# Patient Record
Sex: Male | Born: 1940 | Race: White | Hispanic: No | Marital: Married | Smoking: Former smoker
Health system: Southern US, Community
[De-identification: ages and names within clinical notes are randomized; demographics above are authoritative.]

## PROBLEM LIST (undated history)

## (undated) DIAGNOSIS — N200 Calculus of kidney: Secondary | ICD-10-CM

## (undated) DIAGNOSIS — N529 Male erectile dysfunction, unspecified: Secondary | ICD-10-CM

## (undated) DIAGNOSIS — M25511 Pain in right shoulder: Secondary | ICD-10-CM

## (undated) DIAGNOSIS — C4490 Unspecified malignant neoplasm of skin, unspecified: Secondary | ICD-10-CM

## (undated) DIAGNOSIS — I10 Essential (primary) hypertension: Secondary | ICD-10-CM

## (undated) DIAGNOSIS — I2510 Atherosclerotic heart disease of native coronary artery without angina pectoris: Secondary | ICD-10-CM

## (undated) DIAGNOSIS — N281 Cyst of kidney, acquired: Secondary | ICD-10-CM

## (undated) DIAGNOSIS — K219 Gastro-esophageal reflux disease without esophagitis: Secondary | ICD-10-CM

## (undated) DIAGNOSIS — E785 Hyperlipidemia, unspecified: Secondary | ICD-10-CM

## (undated) DIAGNOSIS — E119 Type 2 diabetes mellitus without complications: Secondary | ICD-10-CM

## (undated) HISTORY — DX: Gastro-esophageal reflux disease without esophagitis: K21.9

## (undated) HISTORY — PX: LITHOTRIPSY: SUR834

## (undated) HISTORY — DX: Male erectile dysfunction, unspecified: N52.9

## (undated) HISTORY — DX: Cyst of kidney, acquired: N28.1

## (undated) HISTORY — DX: Calculus of kidney: N20.0

## (undated) HISTORY — PX: RECONSTRUCTION OF EYELID: SHX6576

## (undated) HISTORY — DX: Atherosclerotic heart disease of native coronary artery without angina pectoris: I25.10

## (undated) HISTORY — DX: Type 2 diabetes mellitus without complications: E11.9

## (undated) HISTORY — DX: Hyperlipidemia, unspecified: E78.5

## (undated) HISTORY — PX: CATARACT EXTRACTION W/ INTRAOCULAR LENS IMPLANT: SHX1309

## (undated) HISTORY — DX: Essential (primary) hypertension: I10

## (undated) HISTORY — PX: CORONARY ANGIOPLASTY WITH STENT PLACEMENT: SHX49

## (undated) HISTORY — DX: Pain in right shoulder: M25.511

## (undated) HISTORY — DX: Unspecified malignant neoplasm of skin, unspecified: C44.90

---

## 1999-12-06 HISTORY — PX: OTHER SURGICAL HISTORY: SHX169

## 2005-05-04 ENCOUNTER — Ambulatory Visit: Payer: Self-pay | Admitting: Internal Medicine

## 2005-06-14 ENCOUNTER — Ambulatory Visit: Payer: Self-pay | Admitting: Internal Medicine

## 2006-06-20 ENCOUNTER — Ambulatory Visit: Payer: Self-pay | Admitting: Internal Medicine

## 2007-07-09 DIAGNOSIS — I10 Essential (primary) hypertension: Secondary | ICD-10-CM | POA: Insufficient documentation

## 2007-07-10 ENCOUNTER — Ambulatory Visit: Payer: Self-pay | Admitting: Internal Medicine

## 2007-07-10 DIAGNOSIS — N400 Enlarged prostate without lower urinary tract symptoms: Secondary | ICD-10-CM

## 2007-07-10 DIAGNOSIS — M2550 Pain in unspecified joint: Secondary | ICD-10-CM | POA: Insufficient documentation

## 2007-07-10 LAB — CONVERTED CEMR LAB: LDL Goal: 100 mg/dL

## 2007-07-19 ENCOUNTER — Encounter (INDEPENDENT_AMBULATORY_CARE_PROVIDER_SITE_OTHER): Payer: Self-pay | Admitting: *Deleted

## 2007-07-19 LAB — CONVERTED CEMR LAB
BUN: 10 mg/dL (ref 6–23)
HDL: 38.6 mg/dL — ABNORMAL LOW (ref >39.0)
LDL Cholesterol: 112 mg/dL — ABNORMAL HIGH (ref 0–99)
PSA: 2.3 ng/mL (ref 0.10–4.00)
Total CHOL/HDL Ratio: 4.5
Triglycerides: 116 mg/dL (ref 0–149)
VLDL: 23 mg/dL (ref 0–40)

## 2007-08-18 ENCOUNTER — Encounter: Payer: Self-pay | Admitting: Internal Medicine

## 2007-08-22 ENCOUNTER — Ambulatory Visit (HOSPITAL_COMMUNITY): Admission: RE | Admit: 2007-08-22 | Discharge: 2007-08-22 | Payer: Self-pay | Admitting: Orthopedic Surgery

## 2008-09-16 ENCOUNTER — Ambulatory Visit: Payer: Self-pay | Admitting: Internal Medicine

## 2008-09-16 DIAGNOSIS — Z85828 Personal history of other malignant neoplasm of skin: Secondary | ICD-10-CM | POA: Insufficient documentation

## 2008-09-16 DIAGNOSIS — R7303 Prediabetes: Secondary | ICD-10-CM | POA: Insufficient documentation

## 2008-09-16 DIAGNOSIS — E785 Hyperlipidemia, unspecified: Secondary | ICD-10-CM | POA: Insufficient documentation

## 2008-09-17 ENCOUNTER — Ambulatory Visit: Payer: Self-pay | Admitting: Internal Medicine

## 2008-09-22 ENCOUNTER — Encounter (INDEPENDENT_AMBULATORY_CARE_PROVIDER_SITE_OTHER): Payer: Self-pay | Admitting: *Deleted

## 2008-09-22 LAB — CONVERTED CEMR LAB
ALT: 25 units/L (ref 0–53)
AST: 21 units/L (ref 0–37)
Cholesterol: 166 mg/dL (ref 0–200)
HDL: 39.4 mg/dL (ref >39.0)
Hgb A1c MFr Bld: 5.7 % (ref 4.6–6.0)
PSA: 2.13 ng/mL (ref 0.10–4.00)
TSH: 2.05 microintl units/mL (ref 0.35–5.50)
Total Bilirubin: 0.8 mg/dL (ref 0.3–1.2)
Total Protein: 6.8 g/dL (ref 6.0–8.3)

## 2008-12-30 ENCOUNTER — Encounter (INDEPENDENT_AMBULATORY_CARE_PROVIDER_SITE_OTHER): Payer: Self-pay | Admitting: *Deleted

## 2008-12-30 ENCOUNTER — Ambulatory Visit: Payer: Self-pay | Admitting: Internal Medicine

## 2008-12-30 LAB — CONVERTED CEMR LAB
OCCULT 2: NEGATIVE
OCCULT 3: NEGATIVE

## 2009-08-05 ENCOUNTER — Encounter: Payer: Self-pay | Admitting: Internal Medicine

## 2009-08-12 ENCOUNTER — Encounter: Payer: Self-pay | Admitting: Internal Medicine

## 2009-08-12 ENCOUNTER — Ambulatory Visit: Payer: Self-pay | Admitting: Urology

## 2009-09-03 ENCOUNTER — Ambulatory Visit: Payer: Self-pay | Admitting: Urology

## 2009-09-03 ENCOUNTER — Encounter: Payer: Self-pay | Admitting: Internal Medicine

## 2009-09-18 ENCOUNTER — Encounter: Payer: Self-pay | Admitting: Internal Medicine

## 2009-09-18 ENCOUNTER — Ambulatory Visit: Payer: Self-pay | Admitting: Urology

## 2009-09-29 ENCOUNTER — Ambulatory Visit: Payer: Self-pay | Admitting: Internal Medicine

## 2009-09-29 DIAGNOSIS — N200 Calculus of kidney: Secondary | ICD-10-CM | POA: Insufficient documentation

## 2009-09-29 DIAGNOSIS — N281 Cyst of kidney, acquired: Secondary | ICD-10-CM

## 2009-10-02 ENCOUNTER — Encounter (INDEPENDENT_AMBULATORY_CARE_PROVIDER_SITE_OTHER): Payer: Self-pay | Admitting: *Deleted

## 2009-10-22 ENCOUNTER — Ambulatory Visit: Payer: Self-pay | Admitting: Urology

## 2009-10-22 ENCOUNTER — Encounter: Payer: Self-pay | Admitting: Internal Medicine

## 2009-10-28 ENCOUNTER — Telehealth: Payer: Self-pay | Admitting: Internal Medicine

## 2009-11-02 ENCOUNTER — Encounter: Payer: Self-pay | Admitting: Cardiology

## 2009-11-02 ENCOUNTER — Ambulatory Visit: Payer: Self-pay | Admitting: Internal Medicine

## 2009-11-02 DIAGNOSIS — R079 Chest pain, unspecified: Secondary | ICD-10-CM

## 2009-11-02 DIAGNOSIS — R9431 Abnormal electrocardiogram [ECG] [EKG]: Secondary | ICD-10-CM

## 2009-11-02 DIAGNOSIS — M25519 Pain in unspecified shoulder: Secondary | ICD-10-CM

## 2009-11-04 ENCOUNTER — Ambulatory Visit: Payer: Self-pay | Admitting: Cardiology

## 2009-11-04 LAB — CONVERTED CEMR LAB
Basophils Absolute: 0 10*3/uL (ref 0.0–0.1)
Calcium: 9.7 mg/dL (ref 8.4–10.5)
GFR calc non Af Amer: 79.13 mL/min (ref >60)
Glucose, Bld: 76 mg/dL (ref 70–99)
Hemoglobin: 16.2 g/dL (ref 13.0–17.0)
INR: 1 (ref 0.8–1.0)
Lymphocytes Relative: 27.4 % (ref 12.0–46.0)
Monocytes Relative: 8.8 % (ref 3.0–12.0)
Neutro Abs: 3.8 10*3/uL (ref 1.4–7.7)
Neutrophils Relative %: 61.7 % (ref 43.0–77.0)
Prothrombin Time: 10.4 s (ref 9.1–11.7)
RDW: 12.2 % (ref 11.5–14.6)
Sodium: 139 meq/L (ref 135–145)

## 2009-11-09 ENCOUNTER — Ambulatory Visit: Payer: Self-pay | Admitting: Cardiology

## 2009-11-09 ENCOUNTER — Inpatient Hospital Stay (HOSPITAL_BASED_OUTPATIENT_CLINIC_OR_DEPARTMENT_OTHER): Admission: RE | Admit: 2009-11-09 | Discharge: 2009-11-09 | Payer: Self-pay | Admitting: Cardiology

## 2009-11-09 ENCOUNTER — Inpatient Hospital Stay (HOSPITAL_COMMUNITY): Admission: AD | Admit: 2009-11-09 | Discharge: 2009-11-10 | Payer: Self-pay | Admitting: Cardiology

## 2009-11-18 ENCOUNTER — Encounter: Payer: Self-pay | Admitting: Cardiology

## 2009-11-20 ENCOUNTER — Ambulatory Visit: Payer: Self-pay | Admitting: Cardiology

## 2009-11-20 DIAGNOSIS — I2510 Atherosclerotic heart disease of native coronary artery without angina pectoris: Secondary | ICD-10-CM | POA: Insufficient documentation

## 2009-12-30 ENCOUNTER — Telehealth: Payer: Self-pay | Admitting: Cardiology

## 2010-01-11 ENCOUNTER — Encounter: Payer: Self-pay | Admitting: Internal Medicine

## 2010-01-11 ENCOUNTER — Ambulatory Visit: Payer: Self-pay | Admitting: Urology

## 2010-01-20 ENCOUNTER — Ambulatory Visit: Payer: Self-pay | Admitting: Cardiology

## 2010-01-26 LAB — CONVERTED CEMR LAB
ALT: 31 units/L (ref 0–53)
AST: 25 units/L (ref 0–37)
Albumin: 3.8 g/dL (ref 3.5–5.2)
Alkaline Phosphatase: 56 units/L (ref 39–117)
Cholesterol: 106 mg/dL (ref 0–200)
LDL Cholesterol: 48 mg/dL (ref 0–99)
Total Protein: 6.4 g/dL (ref 6.0–8.3)
Triglycerides: 73 mg/dL (ref 0.0–149.0)

## 2010-02-19 ENCOUNTER — Ambulatory Visit: Payer: Self-pay | Admitting: Cardiology

## 2010-04-04 HISTORY — PX: OTHER SURGICAL HISTORY: SHX169

## 2010-04-12 ENCOUNTER — Ambulatory Visit: Payer: Self-pay | Admitting: General Practice

## 2010-04-12 ENCOUNTER — Telehealth: Payer: Self-pay | Admitting: Cardiology

## 2010-04-20 ENCOUNTER — Ambulatory Visit: Payer: Self-pay | Admitting: Urology

## 2010-04-26 ENCOUNTER — Telehealth: Payer: Self-pay | Admitting: Cardiology

## 2010-04-28 ENCOUNTER — Encounter: Payer: Self-pay | Admitting: Internal Medicine

## 2010-08-18 ENCOUNTER — Ambulatory Visit: Payer: Self-pay | Admitting: Cardiology

## 2010-08-24 LAB — CONVERTED CEMR LAB
Albumin: 3.8 g/dL (ref 3.5–5.2)
Cholesterol: 117 mg/dL (ref 0–200)
HDL: 38.3 mg/dL — ABNORMAL LOW (ref >39.00)
Total Protein: 6.2 g/dL (ref 6.0–8.3)
Triglycerides: 74 mg/dL (ref 0.0–149.0)
VLDL: 14.8 mg/dL (ref 0.0–40.0)

## 2010-08-25 ENCOUNTER — Ambulatory Visit: Payer: Self-pay | Admitting: Cardiology

## 2010-09-06 ENCOUNTER — Telehealth: Payer: Self-pay | Admitting: Cardiology

## 2010-09-08 ENCOUNTER — Telehealth (INDEPENDENT_AMBULATORY_CARE_PROVIDER_SITE_OTHER): Payer: Self-pay | Admitting: Radiology

## 2010-09-09 ENCOUNTER — Ambulatory Visit: Payer: Self-pay | Admitting: Internal Medicine

## 2010-09-09 ENCOUNTER — Ambulatory Visit: Payer: Self-pay

## 2010-09-09 ENCOUNTER — Ambulatory Visit: Payer: Self-pay | Admitting: Cardiology

## 2010-09-09 ENCOUNTER — Encounter: Payer: Self-pay | Admitting: Cardiology

## 2010-09-09 ENCOUNTER — Encounter (HOSPITAL_COMMUNITY)
Admission: RE | Admit: 2010-09-09 | Discharge: 2010-09-20 | Payer: Self-pay | Source: Home / Self Care | Admitting: Cardiology

## 2010-09-09 ENCOUNTER — Inpatient Hospital Stay (HOSPITAL_COMMUNITY): Admission: EM | Admit: 2010-09-09 | Discharge: 2010-09-10 | Payer: Self-pay | Admitting: Emergency Medicine

## 2010-09-09 ENCOUNTER — Encounter: Payer: Self-pay | Admitting: Internal Medicine

## 2010-09-13 ENCOUNTER — Telehealth: Payer: Self-pay | Admitting: Cardiology

## 2010-09-14 ENCOUNTER — Encounter: Payer: Self-pay | Admitting: Cardiology

## 2010-09-28 ENCOUNTER — Ambulatory Visit: Payer: Self-pay | Admitting: Cardiology

## 2010-11-03 ENCOUNTER — Ambulatory Visit: Payer: Self-pay | Admitting: Cardiology

## 2010-11-08 LAB — CONVERTED CEMR LAB
Alkaline Phosphatase: 73 units/L (ref 39–117)
Bilirubin, Direct: 0.2 mg/dL (ref 0.0–0.3)
LDL Cholesterol: 54 mg/dL (ref 0–99)
Total CHOL/HDL Ratio: 3
VLDL: 15.2 mg/dL (ref 0.0–40.0)

## 2010-11-09 ENCOUNTER — Ambulatory Visit: Payer: Self-pay | Admitting: Internal Medicine

## 2010-11-09 DIAGNOSIS — D485 Neoplasm of uncertain behavior of skin: Secondary | ICD-10-CM

## 2010-11-09 DIAGNOSIS — R1310 Dysphagia, unspecified: Secondary | ICD-10-CM | POA: Insufficient documentation

## 2010-11-11 LAB — CONVERTED CEMR LAB
BUN: 18 mg/dL (ref 6–23)
Basophils Relative: 0.3 % (ref 0.0–3.0)
CO2: 28 meq/L (ref 19–32)
Chloride: 104 meq/L (ref 96–112)
Creatinine, Ser: 1.1 mg/dL (ref 0.4–1.5)
Eosinophils Absolute: 0.1 10*3/uL (ref 0.0–0.7)
Eosinophils Relative: 1.1 % (ref 0.0–5.0)
HCT: 44.5 % (ref 39.0–52.0)
Hgb A1c MFr Bld: 5.8 % (ref 4.6–6.5)
Lymphs Abs: 1.7 10*3/uL (ref 0.7–4.0)
MCHC: 34.8 g/dL (ref 30.0–36.0)
MCV: 94.9 fL (ref 78.0–100.0)
Monocytes Absolute: 0.5 10*3/uL (ref 0.1–1.0)
Neutrophils Relative %: 60.2 % (ref 43.0–77.0)
Potassium: 4.6 meq/L (ref 3.5–5.1)
RBC: 4.7 M/uL (ref 4.22–5.81)
WBC: 5.8 10*3/uL (ref 4.5–10.5)

## 2010-11-24 ENCOUNTER — Ambulatory Visit: Payer: Self-pay | Admitting: Internal Medicine

## 2010-11-24 ENCOUNTER — Encounter (INDEPENDENT_AMBULATORY_CARE_PROVIDER_SITE_OTHER): Payer: Self-pay | Admitting: *Deleted

## 2010-11-24 LAB — CONVERTED CEMR LAB: OCCULT 3: NEGATIVE

## 2010-12-20 ENCOUNTER — Encounter: Payer: Self-pay | Admitting: Cardiology

## 2011-01-02 LAB — CONVERTED CEMR LAB
AST: 21 units/L (ref 0–37)
Albumin: 4.4 g/dL (ref 3.5–5.2)
Basophils Absolute: 0 10*3/uL (ref 0.0–0.1)
CO2: 29 meq/L (ref 19–32)
Eosinophils Absolute: 0.1 10*3/uL (ref 0.0–0.7)
Glucose, Bld: 96 mg/dL (ref 70–99)
HCT: 46.8 % (ref 39.0–52.0)
HDL: 46.8 mg/dL (ref >39.00)
Hemoglobin: 16.1 g/dL (ref 13.0–17.0)
Hgb A1c MFr Bld: 5.7 % (ref 4.6–6.5)
LDL Goal: 130 mg/dL
Lymphs Abs: 1.5 10*3/uL (ref 0.7–4.0)
MCHC: 34.3 g/dL (ref 30.0–36.0)
Neutro Abs: 3.6 10*3/uL (ref 1.4–7.7)
Potassium: 4.3 meq/L (ref 3.5–5.1)
RDW: 12.3 % (ref 11.5–14.6)
Sodium: 138 meq/L (ref 135–145)
TSH: 2.43 microintl units/mL (ref 0.35–5.50)

## 2011-01-04 NOTE — Assessment & Plan Note (Signed)
Summary: Cardiology Nuclear Testing  Nuclear Med Background Indications for Stress Test: Evaluation for Ischemia, Stent Patency   History: Heart Catheterization, Stents  History Comments: 12/10 Stent-RCA  Symptoms: Chest Tightness, Chest Tightness with Exertion, Fatigue, Fatigue with Exertion  Symptoms Comments: Chest tightness with jaw pain. Last episode of DG:UYQIHK, 09/06/10.   Nuclear Pre-Procedure Cardiac Risk Factors: Family History - CAD, History of Smoking, Hypertension, Lipids Caffeine/Decaff Intake: none NPO After: 7:00 PM Lungs: Clear IV 0.9% NS with Angio Cath: 22g     IV Site: R Hand IV Started by: Cathlyn Parsons, RN Chest Size (in) 46     Height (in): 70 Weight (lb): 208 BMI: 29.95 Tech Comments: Metoprolol held x 26 hours.  Nuclear Med Study 1 or 2 day study:  1 day     Stress Test Type:  Treadmill/Lexiscan Reading MD:  Arvilla Meres, MD     Referring MD:  Marca Ancona, MD Resting Radionuclide:  Technetium 80m Tetrofosmin     Resting Radionuclide Dose:  11.0 mCi  Stress Radionuclide:  Technetium 60m Tetrofosmin     Stress Radionuclide Dose:  33.0 mCi   Stress Protocol Exercise Time (min):  8:00 min     Max HR:  115 bpm     Predicted Max HR:  152 bpm  Max Systolic BP: 184 mm Hg     Percent Max HR:  75.66 %     METS: 7.2 Rate Pressure Product:  74259    Stress Test Technologist:  [employer], CMA-N     Nuclear Technologist:  Doyne Keel, CNMT  Rest Procedure  Myocardial perfusion imaging was performed at rest 45 minutes following the intravenous administration of Technetium 45m Tetrofosmin.  Stress Procedure  Patient attempted to walk the treadmill utilizing the Bruce protocol, but was unable to get his heart rate up after walking for 6:30.  He was then given IV Lexiscan 0.4 mg over 15-seconds and then Technetium 68m Tetrofosmin was injected at 30-seconds while the patient continued walking at a low level.  He did c/o mouth, teeth and sinus pressure,  4/10, in the beginning and then chest pressure, 4/10.  He became diaphoretic and nauseated.  He did have inferior ST elevation and ST depression in lead I, V2 and V3 just before getting off the treadmill.  Dr. Johney Frame responded and the patient was transferred to [facility] for cardiac cath today.  No stress imaging done.  QPS Raw Data Images:  Normal; no motion artifact; normal heart/lung ratio. Stress Images:  Not perfrormed Rest Images:  Normal homogeneous uptake in all areas of the myocardium.   Findings High risk nuclear study      Overall Impression  Exercise Capacity: Fair exercise capacity. ECG Impression: Inferior ST elevation Overall Impression: High risk stress nuclear study. Overall Impression Comments: Walked for 6:30 on Bruce protocol but unable to get HR to target. Lexiscan infused and patient abruptly developed CP and inferior ST elevation. EMS activated and taken to hospital for cardiac cath.

## 2011-01-04 NOTE — Progress Notes (Signed)
Summary: stop effient  Phone Note From Other Clinic   Caller: nurse Tammy Summary of Call: pt needs to stop effient 10mg  prior to procedure on tuesday or wednesday next week. ofc [id number] fax [phone] Initial call taken by: Edman Circle,  Apr 12, 2010 3:19 PM  Follow-up for Phone Call        I spoke with nurse Tammy.  Mr. Gloss is having a kidney stone removed next week and needs to be off effient.  I told Tammy I would forward this to Dr. Shirlee Latch as he was out of the office today and would be in tomorrow.  Lisabeth Devoid RN  Additional Follow-up for Phone Call Additional follow up Details #1::        PHYSICIAN OFFICE CALLING REGARDING THIS MESSAGE-THEY NEED AN ANSWER TODAY BECAUSE THE SURGERY IS IN 5 DAYS. Additional Follow-up by: AMANDA DOBY     Appended Document: stop effient If this is an urgent procedure that should not be put off, he may stop Effient 5 days before procedure and restart post-operatively.  Continue ASA.   Appended Document: stop effient talked with Tammy at Dr Assunta Gambles

## 2011-01-04 NOTE — Letter (Signed)
Summary: [facility]   Imported By: Lanelle Bal 01/18/2010 13:47:21  _____________________________________________________________________  External Attachment:    Type:   Image     Comment:   External Document

## 2011-01-04 NOTE — Progress Notes (Signed)
Summary: c/o chest tightness  Phone Note Call from Patient Call back at Home Phone [phone]   Caller: Spouse- betty Reason for Call: Talk to Nurse Details for Reason: c/o chest tightness, jaw/ head hurt like a sinu infection.  Initial call taken by: Lorne Skeens,  September 06, 2010 9:16 AM  Follow-up for Phone Call        talked with pt--pt here about 2 weeks ago and was doing fine--[place] and carrying things up the steps--he denies any chest pain/tightness/heaviness/fullness in his chest at the time he was unloading--he felt exhausted after unloading and felt he needed to sit down, he  felt better later in the day--he was able to continue his usual activities--last night while at rest he had some upper teeth and face-like sinus pain-this was not associated  with any other symptoms,including indigestion--he did state he had similar symptoms prior to stent placement --prior to stent placement he had similar teeth/face pain but also had indigestion--I reviewed with Dr Radene Gunning symptoms are similar to symptoms prior to stent placement and pt does not feel he has any nasal congestion or upper respiratory congestion Dr Shirlee Latch recommended a stress myoview--I discussed with pt and he agreed with this plan--he will use NTG as needed and will call 911 if symptoms are not relieved with NTG

## 2011-01-04 NOTE — Progress Notes (Signed)
Summary: pt has question about medication  Phone Note Call from Patient Call back at [phone]   Caller: Spouse/Betty Reason for Call: Talk to Nurse, Talk to Doctor Summary of Call: pt had surgery and was off his effient and they wannna talk about to start back  Initial call taken by: Omer Jack,  Apr 26, 2010 10:45 AM  Follow-up for Phone Call        Spoke with wife. Pt had procedure on 5/17. Procedure was more complicated than expected and he had to have catheter placed and went home with catheter in place. Restarted effient on 5/20. Wife wondering if they were to restart Effient. I told her per Dr. Alford Highland note effient was to be restarted post op.  Wife states pt has had some bleeding noted around catheter insertion site. Bleeding has been slight and ongoing since surgery. I asked her to contact his urologist regarding this bleeding. Follow-up by: Dossie Arbour, RN, BSN,  Apr 26, 2010 12:01 PM

## 2011-01-04 NOTE — Assessment & Plan Note (Signed)
Summary: per check out/sf   Referring Provider:  Elmore Guise, MD Primary Provider:  Marga Melnick MD  CC:  Pt states he feels okay and no cardiac symptoms or complaints.  He would like to know exactly where his stents are placed.  Pts BP is higher today than normal and his hands are very clammy upon taking pulse/BP rechecked after 10 minutes/systolic down to 161.  History of Present Illness: 70 yo with history of HTN, hyperlipidemia and CAD presents for followup after PCI.  Patient developed exertional chest pain and underwent LHC in 12/10.  This showed tight proximal RCA and moderate mid RCA lesions.  He underwent PCI with 2 bare metal stents in the RCA.  Since discharge he has done well. No further anginal-type chest pain.  He has been walking over a mile on his treadmill daily.  He was working out in the yard yesterday and woke up with pain in both shoulders this morning, but he thinks it was due to sore muscles.  He had a kidney stone attack again in 1/11.    Labs (11/10): creatinine 1.0, K 4.6, TSH normal, LDL 109, HDL 47 Labs (2/11): LDL 48, HDL 43, LFTs normal  Current Medications (verified): 1)  Norvasc 10 Mg  Tabs (Amlodipine Besylate) .... 1/2 Tab By Mouth Once Daily 2)  Quinapril Hcl 40 Mg  Tabs (Quinapril Hcl) .... One Tablet Daily 3)  Metoprolol Tartrate 25 Mg Tabs (Metoprolol Tartrate) .Marland Kitchen.. 1 Two Times A Day 4)  Bufferin 325 Mg Tabs (Aspirin Buf(Cacarb-Mgcarb-Mgo)) .... Take One Tablet Once Daily 5)  Nitroglycerin 0.4 Mg/hr Pt24 (Nitroglycerin) .Marland Kitchen.. 1 Sl As Needed Chest Pain 6)  Simvastatin 40 Mg Tabs (Simvastatin) .... Take One Tablet By Mouth Daily At Bedtime 7)  Effient 10 Mg Tabs (Prasugrel Hcl) .... Take One Tablet By Mouth Daily 8)  Viagra 100 Mg Tabs (Sildenafil Citrate) .... Take One Tablet As Needed  Allergies (verified): No Known Drug Allergies  Past History:  Past Medical History: Reviewed history from 11/20/2009 and no changes required. 1. Hypertension 2.  Nephrolithiasis. Dr. Wanda Plump. 3. Hyperlipidemia 4. Skin cancer, hx of, cell type unknown , Dr Margo Aye 5. Right shoulder pain, rotator cuff 6. CAD: Exertional chest pain, cath 12/10 showed EF 50-55% with mild inferior hypokinesis, 99% proximal and 60% distal RCA stenosis, 80% proximal stenosis of a moderate-sized D1, 40% proximal LAD.  Patient had BMS x 2 placed in the RCA.    Family History: Reviewed history from 11/04/2009 and no changes required. Father:? DM, MI at 44, smoker Mother: breast CA, Gyn CA Siblings: sister breast CA  Social History: Reviewed history from 11/20/2009 and no changes required. Retired, Theatre manager Married Alcohol use-yes:minimally (6 beers /year) Regular exercise-no Former Smoker; quit 1982  Review of Systems       All systems reviewed and negative except as per HPI.   Vital Signs:  Patient profile:   70 year old male Height:      70 inches Weight:      214 pounds BMI:     30.82 Pulse rate:   72 / minute Pulse rhythm:   regular BP sitting:   130 / 80  (left arm) Cuff size:   regular  Vitals Entered By: Judithe Modest CMA (February 19, 2010 11:39 AM)  Serial Vital Signs/Assessments:  Time      Position  BP       Pulse  Resp  Temp     By 11:56 AM  130/80                         Judithe Modest CMA   Physical Exam  General:  Well developed, well nourished, in no acute distress. Neck:  Neck supple, no JVD. No masses, thyromegaly or abnormal cervical nodes. Lungs:  Clear bilaterally to auscultation and percussion. Heart:  Non-displaced PMI, chest non-tender; regular rate and rhythm, S1, S2 without murmurs, rubs or gallops. Carotid upstroke normal, no bruit. Pedals normal pulses. No edema, no varicosities. Abdomen:  Bowel sounds positive; abdomen soft and non-tender without masses, organomegaly, or hernias noted. No hepatosplenomegaly. Extremities:  No clubbing or cyanosis. Neurologic:  Alert and oriented x 3. Psych:  Normal  affect.   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  He is no longer having exertional chest pressure.  He has residual 80% proximal stenosis in a moderate D1 which we elected to manage medically, will observe closely for recurrent symptoms.  As he received bare metal stents, if he continues to be severely symptomatic with kidney stones and needs to undergo lithotripsy, he could stop prasugrel for the procedure (has been > 1 month post stents) and restart after the procedure.  However, ideally he will continue prasugrel without interruption for at least a year.  He will also continue ASA, statin, metoprolol, and ACEI.    Problem # 2:  HYPERLIPIDEMIA (ICD-272.4) Lipids at goal (LDL < 70) on current statin dose.    Problem # 3:  HYPERTENSION (ICD-401.9) BP is under good control.   Patient Instructions: 1)  Your physician recommends that you return for a FASTING lipid profile/liver profile September 14,2011   2)  Your physician wants you to follow-up in:  6 months with Dr Shirlee Latch.  You will receive a reminder letter in the mail two months in advance. If you don't receive a letter, please call our office to schedule the follow-up appointment.

## 2011-01-04 NOTE — Letter (Signed)
Summary: [facility]  [facility]   Imported By: Lanelle Bal 05/13/2010 12:42:28  _____________________________________________________________________  External Attachment:    Type:   Image     Comment:   External Document

## 2011-01-04 NOTE — Assessment & Plan Note (Signed)
Summary: [facility]   Visit Type:  Follow-up Referring Provider:  Elmore Guise, MD Primary Provider:  Marga Melnick MD   History of Present Illness: Peter Blair is a pleasant 70 yo WM with a h/o known CAD s/p PCI RCA 12/10 who now presents for stress testing.  He reports crescendoing angina since 10/3.  He describes diaphoresis with SOB and pain into his chest /neck.  Episodes increased in frequency and duration and he therefore presented today for stress testing.  While walking on treadmill, he developed diaphorsis and SOB.  As he was not at target HR, lexiscan was injected.  He then developed inferior ST elevation with ST depression in V2 and V3.  He has reproduction of sympotoms including diaphorsis, nausaea, and chest pain.  Upon termination of exercise, his symptoms gradually resolved and his ST segments normalized.  Allergies: No Known Drug Allergies  Past History:  Past Medical History: Reviewed history from 11/20/2009 and no changes required. 1. Hypertension 2. Nephrolithiasis. Dr. Wanda Plump. 3. Hyperlipidemia 4. Skin cancer, hx of, cell type unknown , Dr Margo Aye 5. Right shoulder pain, rotator cuff 6. CAD: Exertional chest pain, cath 12/10 showed EF 50-55% with mild inferior hypokinesis, 99% proximal and 60% distal RCA stenosis, 80% proximal stenosis of a moderate-sized D1, 40% proximal LAD.  Patient had BMS x 2 placed in the RCA.    Past Surgical History: Reviewed history from 09/29/2009 and no changes required. lithotripsy X1; Flex sigmoidoscopy ( no colonoscopy ; "I hate to go through it"; [facility] reviewed)  Family History: Reviewed history from 11/04/2009 and no changes required. Father:? DM, MI at 83, smoker Mother: breast CA, Gyn CA Siblings: sister breast CA  Social History: Reviewed history from 11/20/2009 and no changes required. Retired, Theatre manager Married Alcohol use-yes:minimally (6 beers /year) Regular exercise-no Former Smoker; quit  1982  Review of Systems       All systems are reviewed and negative except as listed in the HPI.   Physical Exam  General:  ill appearing and diaphoretic Head:  normocephalic and atraumatic Eyes:  PERRLA/EOM intact; conjunctiva and lids normal. Mouth:  Teeth, gums and palate normal. Oral mucosa normal. Neck:  Neck supple, no JVD. No masses, thyromegaly or abnormal cervical nodes. Lungs:  Clear bilaterally to auscultation and percussion. Heart:  Non-displaced PMI, chest non-tender; regular rate and rhythm, S1, S2 without murmurs, rubs or gallops. Carotid upstroke normal, no bruit. Normal abdominal aortic size, no bruits. Femorals normal pulses, no bruits. Pedals normal pulses. No edema, no varicosities. Abdomen:  Bowel sounds positive; abdomen soft and non-tender without masses, organomegaly, or hernias noted. No hepatosplenomegaly. Msk:  Back normal, normal gait. Muscle strength and tone normal. Pulses:  pulses normal in all 4 extremities Extremities:  No clubbing or cyanosis. Neurologic:  Alert and oriented x 3.   Impression & Recommendations:  Problem # 1:  ENCOUNTER FOR LONG-TERM USE OF OTHER MEDICATIONS (ICD-V58.69) The patient presents with symptoms worrisome for unstable angina.  During stress testing today, he had transient inferior ST segment elevation with reproduction of anginal symptoms.  He is now chest pain free and ST segment elevation is resolving. He has received ASA and slNTG. He will now be admitted urgently for cath at [facility].

## 2011-01-04 NOTE — Progress Notes (Signed)
Summary: NEED NEW PRESCRIPTION   Phone Note Call from Patient Call back at Home Phone [phone]   Caller: Patient Summary of Call: PT NEED A NEW PRESCRIPTION  90 DAY FAX TO [facility] [phone]( EFFIENT 10MG ) MEMBER ID [id number]  Initial call taken by: Judie Grieve,  December 30, 2009 2:41 PM  Follow-up for Phone Call        new Rx sent for effient to [facility] for 90 day supply Follow-up by: Judithe Modest CMA,  December 30, 2009 4:22 PM    New/Updated Medications: EFFIENT 10 MG TABS (PRASUGREL HCL) Take one tablet by mouth daily Prescriptions: EFFIENT 10 MG TABS (PRASUGREL HCL) Take one tablet by mouth daily  #90 x 3   Entered by:   Judithe Modest CMA   Authorized by:   Marca Ancona, MD   Signed by:   Judithe Modest CMA on 12/30/2009   Method used:   Electronically to        [facility]* (mail-order)             ,          Ph: [phone]       Fax: [phone]   RxID:   [id number]

## 2011-01-04 NOTE — Progress Notes (Signed)
Summary: B/P readings  Phone Note Outgoing Call   Call placed by: Katina Dung, RN, BSN,  September 13, 2010 5:35 PM Call placed to: Patient Summary of Call: B/P readings  Follow-up for Phone Call        pt had stent placement 09/09/10--I called pt to get B/P--these are readings prior to stent placement 09/09/10--- 136.9 systolic average 15 readings--diastolic  average  from 15 readings--74.1 -pt has p hosp appt with Dr Shirlee Latch 09/28/10--he will continue to check his B/P and bring the readings to the appt 09/28/10--I will forward to Dr Shirlee Latch for review

## 2011-01-04 NOTE — Cardiovascular Report (Signed)
Summary: Pre-Cath Orders  Pre-Cath Orders   Imported By: Marylou Mccoy 09/23/2010 08:31:20  _____________________________________________________________________  External Attachment:    Type:   Image     Comment:   External Document

## 2011-01-04 NOTE — Assessment & Plan Note (Signed)
Summary: med refill/cbs   Vital Signs:  Patient profile:   70 year old male Height:      70.75 inches Weight:      210.8 pounds BMI:     29.72 Temp:     98.4 degrees F oral Pulse rate:   76 / minute Resp:     16 per minute BP sitting:   122 / 80  (left arm) Cuff size:   regular  Vitals Entered By: Shonna Chock CMA (November 09, 2010 10:58 AM) CC: 1.) Yearly   2.)Sharpe pain in abdomen, left side x 1 month  Comments Patient states he is up to date on pneumonia and flu vaccine    Primary Care Provider:  Marga Melnick MD  CC:  1.) Yearly   2.)Sharpe pain in abdomen and left side x 1 month .  History of Present Illness: Here for Medicare AWV: 1.Risk factors based on Past M, S, F history:CAD; HTN;Dyslipidemia ( chart updated) 2.Physical Activities:walking > 60 min 1-2X/ week w/o symptoms 3.Depression/mood: no issues 4.Hearing: whisper heard @ 6 ft 5.ADL's: no limitations 6.Fall Risk: none ( 1 fall when he tripped) 7.Home Safety: safety proofed 8.Height, weight, &visual acuity:wall chart read @ 6 ft with lenses 9.Counseling: POA &Living Will in place 10.Labs ordered based on risk factors: see Orders 11.Referral Coordination: none requested 12. Care Plan: see Instructions 13. Cognitive Assessment: Oriented X 3 ;memory & recall intact  ; "WORLD" spelled backwards; mood & affect normal. Hypertension Follow-Up:  The patient reports fatigue, but denies lightheadedness, urinary frequency, headaches, and edema.  Associated symptoms include exercise intolerance due to  musculoskeletal issues.  The patient denies the following associated symptoms: chest pain, chest pressure, dyspnea, palpitations, and syncope.  Compliance with medications (by patient report) has been near 100%.  Adjunctive measures currently NOT used by the patient include salt restriction.  BP averages 128.6/70.65. Hyperlipidemia Follow-Up: The patient reports diffuse , intermittent  muscle aches, but denies GI upset,  abdominal pain, flushing, itching, constipation, and diarrhea.  Adjunctive measures currently used by the patient include ASA.  LDL 54 & HDL 40 @ Dr Alford Highland office.  Preventive Screening-Counseling & Management  Alcohol-Tobacco     Alcohol drinks/day: 0     Smoking Status: quit > 6 months     Year Started: 1962     Year Quit: 1972  Caffeine-Diet-Exercise     Caffeine use/day: rarely     Diet Comments: no diet  Hep-HIV-STD-Contraception     Dental Visit-last 6 months yes     Sun Exposure-Excessive: no  Safety-Violence-Falls     Seat Belt Use: yes     Firearms in the Home: firearms in the home     Firearm Counseling: not indicated; uses recommended firearm safety measures     Smoke Detectors: yes      Blood Transfusions:  no.        Travel History:  1963 [place].    Allergies (verified): No Known Drug Allergies  Past History:  Past Medical History: 1. Hypertension 2. Nephrolithiasis. Dr. Achilles Dunk, [place] 3. Hyperlipidemia 4. Skin cancer,PMH  of, cell type unknown , Dr Margo Aye 5. Right shoulder pain, rotator cuff 6. CAD: Exertional chest pain, cath 12/10 showed EF 50-55% with mild inferior hypokinesis, 99% proximal and 60% distal RCA stenosis, 80% proximal stenosis of a moderate-sized D1, 40% proximal LAD.  Patient had BMS x 2 placed in the RCA.  Unstable angina 10/11 with LHC showing 99% instent restenosis in proximal RCA.  Patient had PROMUS DES to the proximal RCA.  EF 60% on LV-gram.   Past Surgical History: Lithotripsy X1; Cystoscopic removal 04/2010; Flexibe  sigmoidoscopy ( no colonoscopy ; "I hate to go through it"; [facility] reviewed again; Effient precludes procedure @ present)  Family History: Father:? DM, MI at 54, smoker Mother: breast  & Gyn cancer Siblings: sister: breast cancer  Social History: Retired, Theatre manager Married Alcohol use-yes:minimally (6 beers /year) Regular exercise-no Former Smoker; quit 1972 Smoking Status:  quit > 6 months Caffeine  use/day:  rarely Dental Care w/in 6 mos.:  yes Sun Exposure-Excessive:  no Seat Belt Use:  yes Blood Transfusions:  no  Review of Systems  The patient denies anorexia, fever, weight loss, weight gain, vision loss, decreased hearing, hoarseness, prolonged cough, hemoptysis, melena, hematochezia, depression, unusual weight change, enlarged lymph nodes, and angioedema.         Some food dysphagia 1-2 X/week in context of dyspepsia. Recurrent scalp lesion; PMH of skin cancer. Easy bruising with Effient  Physical Exam  General:  well-nourished,in no acute distress; alert,appropriate and cooperative throughout examination Head:  Normocephalic and atraumatic without obvious abnormalities. pattern  alopecia  Eyes:  No corneal or conjunctival inflammation noted. EOMI. Perrla. Funduscopic exam benign, without hemorrhages, exudates or papilledema. Vision grossly normal. Ears:  External ear exam shows no significant lesions or deformities.  Otoscopic examination reveals clear canals, tympanic membranes are intact bilaterally without bulging, retraction, inflammation or discharge. Hearing is grossly normal bilaterally. External hirsutism Nose:  External nasal examination shows no deformity or inflammation. Nasal mucosa are pink and moist without lesions or exudates. Slight septal deviation Mouth:  Oral mucosa and oropharynx without lesions or exudates.  Teeth in good repair. Neck:  No deformities, masses, or tenderness noted. Lungs:  Normal respiratory effort, chest expands symmetrically. Lungs are clear to auscultation, no crackles or wheezes. Heart:  Normal rate and regular rhythm. S1 and S2 normal without gallop, murmur, click, rub . Soft S4 Abdomen:  Bowel sounds positive,abdomen soft  but minimal LUQ  tenderness Illdefined R abd mass ( "renal size is 15 cm  size "); ventral  hernias noted. Genitalia:  Dr Achilles Dunk Msk:  No deformity or scoliosis noted of thoracic or lumbar spine.   Pulses:  R and L  carotid,radial,dorsalis pedis and posterior tibial pulses are full and equal bilaterally Extremities:  No clubbing, cyanosis, edema, or deformity noted with normal full range of motion of all joints.   Neurologic:  alert & oriented X3 and DTRs symmetrical and normal.   Skin:  Keratotic scalp lesion Cervical Nodes:  No lymphadenopathy noted Axillary Nodes:  No palpable lymphadenopathy Psych:  memory intact for recent and remote, normally interactive, and good eye contact.     Impression & Recommendations:  Problem # 1:  PREVENTIVE HEALTH CARE (ICD-V70.0)  Orders: [facility] -Subsequent Annual Wellness Visit [phone])  Problem # 2:  DYSPHAGIA UNSPECIFIED (ICD-787.20) risks discussed Orders: Venipuncture (60454) TLB-CBC Platelet - w/Differential (85025-CBCD)  Problem # 3:  NEOPLASM OF UNCERTAIN BEHAVIOR OF SKIN (ICD-238.2)  Problem # 4:  HYPERTENSION (ICD-401.9) controlled His updated medication list for this problem includes:    Norvasc 10 Mg Tabs (Amlodipine besylate) .Marland Kitchen... 1 by mouth daily    Quinapril Hcl 40 Mg Tabs (Quinapril hcl) ..... One tablet daily    Metoprolol Tartrate 25 Mg Tabs (Metoprolol tartrate) .Marland Kitchen... 1 two times a day  Orders: Venipuncture (09811) TLB-BMP (Basic Metabolic Panel-BMET) (80048-METABOL)  Problem # 5:  HYPERGLYCEMIA, FASTING (ICD-790.29)  PMH  of  Orders: Venipuncture (16109) TLB-A1C / Hgb A1C (Glycohemoglobin) (83036-A1C)  Problem # 6:  CAD, NATIVE VESSEL (ICD-414.01)  His updated medication list for this problem includes:    Norvasc 10 Mg Tabs (Amlodipine besylate) .Marland Kitchen... 1 by mouth daily    Quinapril Hcl 40 Mg Tabs (Quinapril hcl) ..... One tablet daily    Metoprolol Tartrate 25 Mg Tabs (Metoprolol tartrate) .Marland Kitchen... 1 two times a day    Bufferin 325 Mg Tabs (Aspirin buf(cacarb-mgcarb-mgo)) .Marland Kitchen... Take one tablet once daily    Nitroglycerin 0.4 Mg/hr Pt24 (Nitroglycerin) .Marland Kitchen... 1 sl as needed chest pain    Effient 10 Mg Tabs (Prasugrel hcl) .Marland Kitchen... 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 Do not take with ntg; 1/2 -1 once daily as needed. He knows can not be taken with Nitroglycerin  Complete Medication List: 1)  Norvasc 10 Mg Tabs (Amlodipine besylate) .Marland Kitchen.. 1 by mouth daily 2)  Quinapril Hcl 40 Mg Tabs (Quinapril hcl) .... One tablet daily 3)  Metoprolol Tartrate 25 Mg Tabs (Metoprolol tartrate) .Marland Kitchen.. 1 two times a day 4)  Bufferin 325 Mg Tabs (Aspirin buf(cacarb-mgcarb-mgo)) .... Take one tablet once daily 5)  Nitroglycerin 0.4 Mg/hr Pt24 (Nitroglycerin) .Marland Kitchen.. 1 sl as needed chest pain 6)  Lipitor 40 Mg Tabs (Atorvastatin calcium) .Marland Kitchen.. 1 by mouth daily 7)  Effient 10 Mg Tabs (Prasugrel hcl) .... Take one tablet by mouth daily 8)  Viagra 100 Mg Tabs (Sildenafil citrate) .... Do not take with ntg; 1/2 -1 once daily as needed 9)  Ranitidine Hcl 150 Mg Tabs (Ranitidine hcl) .Marland Kitchen.. 1 two times a day pre meals  Other Orders: TLB-TSH (Thyroid Stimulating Hormone) (60454-UJW)  Patient Instructions: 1)  Make appt to see Dr Suan Halter, Derm. 2)  Avoid foods high in acid (tomatoes, citrus juices, spicy foods). Avoid eating within two hours of lying down or before exercising. Do not over eat; try smaller more frequent meals. Elevate head of bed twelve inches when sleeping. Complete stool cards. GI referral absolutely necessary if DYSPHAGIA progresses despite meds as discussed. Prescriptions: RANITIDINE HCL 150 MG TABS (RANITIDINE HCL) 1 two times a day pre meals  #180 x 0   Entered and Authorized by:   Marga Melnick MD   Signed by:   Marga Melnick MD on 11/09/2010   Method used:   Print then Give to Patient   RxID:   [id number] VIAGRA 100 MG TABS (SILDENAFIL CITRATE) DO NOT TAKE WITH NTG; 1/2 -1 once daily as needed  #18 x 0   Entered and Authorized by:   Marga Melnick MD   Signed by:   Marga Melnick MD on 11/09/2010   Method used:   Print then Give to Patient   RxID:   [id number] QUINAPRIL HCL 40 MG  TABS (QUINAPRIL HCL) one tablet daily  #90 x 3   Entered and Authorized by:   Marga Melnick MD   Signed by:   Marga Melnick MD on 11/09/2010   Method used:   Print then Give to Patient   RxID:   [id number] VIAGRA 100 MG TABS (SILDENAFIL CITRATE) take one tablet as needed  #6 x 1   Entered and Authorized by:   Marga Melnick MD   Signed by:   Marga Melnick MD on 11/09/2010   Method used:   Print then Give to Patient  RxID:   [id number] METOPROLOL TARTRATE 25 MG TABS (METOPROLOL TARTRATE) 1 two times a day  #180 x 3   Entered and Authorized by:   Marga Melnick MD   Signed by:   Marga Melnick MD on 11/09/2010   Method used:   Print then Give to Patient   RxID:   [id number] NORVASC 10 MG  TABS (AMLODIPINE BESYLATE) 1 by mouth daily  #90 x 3   Entered and Authorized by:   Marga Melnick MD   Signed by:   Marga Melnick MD on 11/09/2010   Method used:   Print then Give to Patient   RxID:   [id number]    Orders Added: 1)  [facility] -Subsequent Annual Wellness Visit [G0439] 2)  Est. Patient Level III [01027] 3)  Venipuncture [36415] 4)  TLB-BMP (Basic Metabolic Panel-BMET) [80048-METABOL] 5)  TLB-CBC Platelet - w/Differential [85025-CBCD] 6)  TLB-TSH (Thyroid Stimulating Hormone) [84443-TSH] 7)  TLB-A1C / Hgb A1C (Glycohemoglobin) [83036-A1C]

## 2011-01-04 NOTE — Progress Notes (Signed)
Summary: nuc pre-procedure  Phone Note Outgoing Call Call back at [facility] Phone [phone]   Call placed by: Domenic Polite, CNMT,  September 08, 2010 3:15 PM Call placed to: Patient Reason for Call: Confirm/change Appt Summary of Call: Reviewed information on [employer] Information Sheet (see scanned document for further details).  Spoke with patient. Initial call taken by: Domenic Polite, CNMT,  September 08, 2010 3:17 PM     Nuclear Med Background Indications for Stress Test: Evaluation for Ischemia, Stent Patency   History: Heart Catheterization, Stents  History Comments: 12/10 cath wiyh stent to RCA  Symptoms: Chest Tightness, Fatigue with Exertion  Symptoms Comments: chest tightness with jaw pain   Nuclear Pre-Procedure Cardiac Risk Factors: Family History - CAD, History of Smoking, Hypertension, Lipids Height (in): 70

## 2011-01-04 NOTE — Assessment & Plan Note (Signed)
Summary: Peter Blair   Referring Provider:  Elmore Guise, MD Primary Provider:  Marga Melnick MD   History of Present Illness: 70 yo with CAD s/p recent unstable angina and left heart cath showing severe in-stent restenosis in the proximal RCA with placement of DES to the proximal RCA.  Since that time he has been doing reasonably well.  He had pain in his right forearm after the radial cath but this is improving.  He is taking his Effient without missing doses.  He has no exertional dyspnea or chest/neck pain (neck pain has been his anginal symptom in the past).  He walks for exercise and went about 1.5 miles yesterday.  We switched him to Lipitor, and he is concerned about getting myalgias (has had friends with myalgias on Lipitor).    ECG: NSR, LVH  Current Medications (verified): 1)  Norvasc 10 Mg  Tabs (Amlodipine Besylate) .Marland Kitchen.. 1 By Mouth Daily 2)  Quinapril Hcl 40 Mg  Tabs (Quinapril Hcl) .... One Tablet Daily 3)  Metoprolol Tartrate 25 Mg Tabs (Metoprolol Tartrate) .Marland Kitchen.. 1 Two Times A Day 4)  Bufferin 325 Mg Tabs (Aspirin Buf(Cacarb-Mgcarb-Mgo)) .... Take One Tablet Once Daily 5)  Nitroglycerin 0.4 Mg/hr Pt24 (Nitroglycerin) .Marland Kitchen.. 1 Sl As Needed Chest Pain 6)  Lipitor 40 Mg Tabs (Atorvastatin Calcium) .Marland Kitchen.. 1 By Mouth Daily 7)  Effient 10 Mg Tabs (Prasugrel Hcl) .... Take One Tablet By Mouth Daily 8)  Viagra 100 Mg Tabs (Sildenafil Citrate) .... Take One Tablet As Needed  Allergies (verified): No Known Drug Allergies  Past History:  Past Medical History: 1. Hypertension 2. Nephrolithiasis. Dr. Wanda Plump. 3. Hyperlipidemia 4. Skin cancer, hx of, cell type unknown , Dr Margo Aye 5. Right shoulder pain, rotator cuff 6. CAD: Exertional chest pain, cath 12/10 showed EF 50-55% with mild inferior hypokinesis, 99% proximal and 60% distal RCA stenosis, 80% proximal stenosis of a moderate-sized D1, 40% proximal LAD.  Patient had BMS x 2 placed in the RCA.  Unstable angina 10/11 with LHC showing  99% instent restenosis in proximal RCA.  Patient had PROMUS DES to the proximal RCA.  EF 60% on LV-gram.   Family History: Reviewed history from 11/04/2009 and no changes required. Father:? DM, MI at 72, smoker Mother: breast CA, Gyn CA Siblings: sister breast CA  Social History: Reviewed history from 11/20/2009 and no changes required. Retired, Theatre manager Married Alcohol use-yes:minimally (6 beers /year) Regular exercise-no Former Smoker; quit 1982  Review of Systems       All systems reviewed and negative except as per HPI.   Vital Signs:  Patient profile:   70 year old male Height:      70 inches Weight:      214 pounds BMI:     30.82 Pulse rate:   88 / minute Resp:     16 per minute BP sitting:   138 / 72  (right arm)  Vitals Entered By: Marrion Coy, CNA (September 28, 2010 2:42 PM)  Physical Exam  General:  Well developed, well nourished, in no acute distress. Neck:  Neck supple, no JVD. No masses, thyromegaly or abnormal cervical nodes. Lungs:  Clear bilaterally to auscultation and percussion. Heart:  Non-displaced PMI, chest non-tender; regular rate and rhythm, S1, S2 without murmurs, rubs or gallops. Carotid upstroke normal, no bruit. Normal abdominal aortic size, no bruits. Femorals normal pulses, no bruits. Pedals normal pulses. No edema, no varicosities.  Right radial pulse 2+, slight residual bruising right forearm.  Abdomen:  Bowel sounds  positive; abdomen soft and non-tender without masses, organomegaly, or hernias noted. No hepatosplenomegaly. Extremities:  No clubbing or cyanosis. Neurologic:  Alert and oriented x 3. Psych:  Normal affect.   Impression & Recommendations:  Problem # 1:  CAD, NATIVE VESSEL (ICD-414.01) S/p unstable angina episode due to severe in-stent restenosis in the proximal RCA bare metal stent.  He had a bare metal stent placed during his initial intervention due to severely symptomatic kidney stones and need for lithotripsy.   He now has a drug-eluting stent, which hopefully will be more durable.  However, he must remain on Effient for at least a year. He has had no recent nephrolithiasis symptoms.   Problem # 2:  HYPERLIPIDEMIA (ICD-272.4) Check lipids/LFTs in about a month on Lipitor.  Goal LDL < 70.  He has had no myalgias but is worried.  He can try Co Q10 from over the counter.   Problem # 3:  HYPERTENSION (ICD-401.9) BP ok on current regimen.   Patient Instructions: 1)  Your physician recommends that you return for a FASTING lipid profile/liver profile in 1 month--414.01 v58.69 2)  Your physician recommends that you schedule a follow-up appointment in: 4 months with Dr Shirlee Latch. Prescriptions: LIPITOR 40 MG TABS (ATORVASTATIN CALCIUM) 1 by mouth daily  #90 x 3   Entered by:   Katina Dung, RN, BSN   Authorized by:   Marca Ancona, MD   Signed by:   Katina Dung, RN, BSN on 09/28/2010   Method used:   Print then Give to Patient   RxID:   [id number]

## 2011-01-04 NOTE — Assessment & Plan Note (Signed)
Summary: f40m   Visit Type:  Follow-up Referring Kionte Baumgardner:  Elmore Guise, MD Primary Jaxie Racanelli:  Marga Melnick MD   History of Present Illness: 70 yo with history of HTN, hyperlipidemia and CAD presents for followup after PCI.  Patient developed exertional chest pain and underwent LHC in 12/10.  This showed tight proximal RCA and moderate mid RCA lesions.  He underwent PCI with 2 bare metal stents in the RCA.  Since discharge he has done well. No further anginal-type chest pain.  He has been walking 1.5 miles on his treadmill daily.  He has had trouble with kidney stones and came off Effient for lithotripsy several months ago without problems.  BP has been running a bit high at home and is 140/70 today in the office.   Labs (11/10): creatinine 1.0, K 4.6, TSH normal, LDL 109, HDL 47 Labs (2/11): LDL 48, HDL 43, LFTs normal Labs (1/61): LDL 64, HDL 38, LFTs normal  ECG: NSR, normal  Current Medications (verified): 1)  Norvasc 10 Mg  Tabs (Amlodipine Besylate) .... 1/2 Tab By Mouth Once Daily 2)  Quinapril Hcl 40 Mg  Tabs (Quinapril Hcl) .... One Tablet Daily 3)  Metoprolol Tartrate 25 Mg Tabs (Metoprolol Tartrate) .Marland Kitchen.. 1 Two Times A Day 4)  Bufferin 325 Mg Tabs (Aspirin Buf(Cacarb-Mgcarb-Mgo)) .... Take One Tablet Once Daily 5)  Nitroglycerin 0.4 Mg/hr Pt24 (Nitroglycerin) .Marland Kitchen.. 1 Sl As Needed Chest Pain 6)  Simvastatin 40 Mg Tabs (Simvastatin) .... Take One Tablet By Mouth Daily At Bedtime 7)  Effient 10 Mg Tabs (Prasugrel Hcl) .... Take One Tablet By Mouth Daily 8)  Viagra 100 Mg Tabs (Sildenafil Citrate) .... Take One Tablet As Needed  Allergies (verified): No Known Drug Allergies  Past History:  Past Medical History: Reviewed history from 11/20/2009 and no changes required. 1. Hypertension 2. Nephrolithiasis. Dr. Wanda Plump. 3. Hyperlipidemia 4. Skin cancer, hx of, cell type unknown , Dr Margo Aye 5. Right shoulder pain, rotator cuff 6. CAD: Exertional chest pain, cath 12/10 showed  EF 50-55% with mild inferior hypokinesis, 99% proximal and 60% distal RCA stenosis, 80% proximal stenosis of a moderate-sized D1, 40% proximal LAD.  Patient had BMS x 2 placed in the RCA.    Family History: Reviewed history from 11/04/2009 and no changes required. Father:? DM, MI at 41, smoker Mother: breast CA, Gyn CA Siblings: sister breast CA  Social History: Reviewed history from 11/20/2009 and no changes required. Retired, Theatre manager Married Alcohol use-yes:minimally (6 beers /year) Regular exercise-no Former Smoker; quit 1982  Review of Systems       All systems reviewed and negative except as per HPI.   Vital Signs:  Patient profile:   70 year old male Height:      70 inches Weight:      210 pounds BMI:     30.24 Pulse rate:   64 / minute BP sitting:   140 / 70  (left arm)  Vitals Entered By: Laurance Flatten CMA (August 25, 2010 9:27 AM)  Physical Exam  General:  Well developed, well nourished, in no acute distress. Neck:  Neck supple, no JVD. No masses, thyromegaly or abnormal cervical nodes. Lungs:  Clear bilaterally to auscultation and percussion. Heart:  Non-displaced PMI, chest non-tender; regular rate and rhythm, S1, S2 without murmurs, rubs. +S4. Carotid upstroke normal, no bruit. Pedals normal pulses. No edema, no varicosities. Abdomen:  Bowel sounds positive; abdomen soft and non-tender without masses, organomegaly, or hernias noted. No hepatosplenomegaly. Extremities:  No clubbing or  cyanosis. Neurologic:  Alert and oriented x 3. Psych:  Normal affect.   Impression & Recommendations:  Problem # 1:  CAD, NATIVE VESSEL (ICD-414.01) Patient is s/p PCI to RCA with BMS x 2 in 12/10.  He has residual 80% proximal stenosis in a moderate D1 which we elected to manage medically, will observe closely for recurrent symptoms (none currently).  He will continue Effient for a total of 1 year.  He is also on ACEI, ASA, beta blocker, and statin.   Problem # 2:   HYPERTENSION (ICD-401.9) BP is borderline today, and he says it runs high at times at home.  Will have him check BP daily for 2 weeks and record readings.  We will call him to see if it is indeed elevated.  If so, will start chlorthalidone 12.5 mg daily (this could potentially decrease kidney stone formation by lowering calcium excretion).    Problem # 3:  HYPERLIPIDEMIA (ICD-272.4) I am going to change Mr Clingan statin to Lipitor 20 mg daily as 40 mg Zocor is contraindicated with the use of amlodipine.  Lipids/LFTs in 2 months with the change.   Other Orders: EKG w/ Interpretation (93000)  Patient Instructions: 1)  Stop Simvastatin 2)  Start Lipitor 20mg  daily 3)  In December stop your Effient 4)  Your physician recommends that you return for a FASTING lipid adn liver profile: in 2 months (272.0) 5)  Thurston Hole will call you  in 2 weeks to check on your BP 6)  Your physician wants you to follow-up in:  6 months.  You will receive a reminder letter in the mail two months in advance. If you don't receive a letter, please call our office to schedule the follow-up appointment. Prescriptions: LIPITOR 20 MG TABS (ATORVASTATIN CALCIUM) Take one tablet by mouth daily.  #90 x 3   Entered by:   Meredith Staggers, RN   Authorized by:   Marca Ancona, MD   Signed by:   Meredith Staggers, RN on 08/25/2010   Method used:   Electronically to        [facility]* (retail)             ,          Ph: [phone]       Fax: [phone]   RxID:   [id number]

## 2011-01-04 NOTE — Miscellaneous (Signed)
Summary: [facility] Order/Treatment Plan   [facility] Order/Treatment Plan   Imported By: Roderic Ovens 09/22/2010 15:38:54  _____________________________________________________________________  External Attachment:    Type:   Image     Comment:   External Document

## 2011-01-06 NOTE — Letter (Signed)
Summary: Results Follow up Letter  [facility] [place]  [address] [place], [place] [address]   Phone: [phone]  Fax: [phone]    11/24/2010 MRN: [id number]  MACLAIN COHRON [address] [place], [place]  [address]  Dear Mr. STAGGS,  The following are the results of your recent test(s):  Test         Result    Pap Smear:        Normal _____  Not Normal _____ Comments: ______________________________________________________ Cholesterol: LDL(Bad cholesterol):         Your goal is less than:         HDL (Good cholesterol):       Your goal is more than: Comments:  ______________________________________________________ Mammogram:        Normal _____  Not Normal _____ Comments:  ___________________________________________________________________ Hemoccult:        Normal __X___  Not normal _______ Comments:    _____________________________________________________________________ Other Tests:    We routinely do not discuss normal results over the telephone.  If you desire a copy of the results, or you have any questions about this information we can discuss them at your next office visit.   Sincerely,

## 2011-01-20 NOTE — Miscellaneous (Signed)
Summary: [facility]   [facility]   Imported By: Roderic Ovens 01/10/2011 14:20:56  _____________________________________________________________________  External Attachment:    Type:   Image     Comment:   External Document

## 2011-01-23 IMAGING — CR DG ABDOMEN 1V
1 series · 2 of 2 positions shown · non-contrast
Comparison: none

REASON FOR EXAM: nephrolithiasis pt need films
COMMENTS:

[Series 1: view not recorded · 0.17mm/px · 2 of 2 slices shown]
[im 1/2]
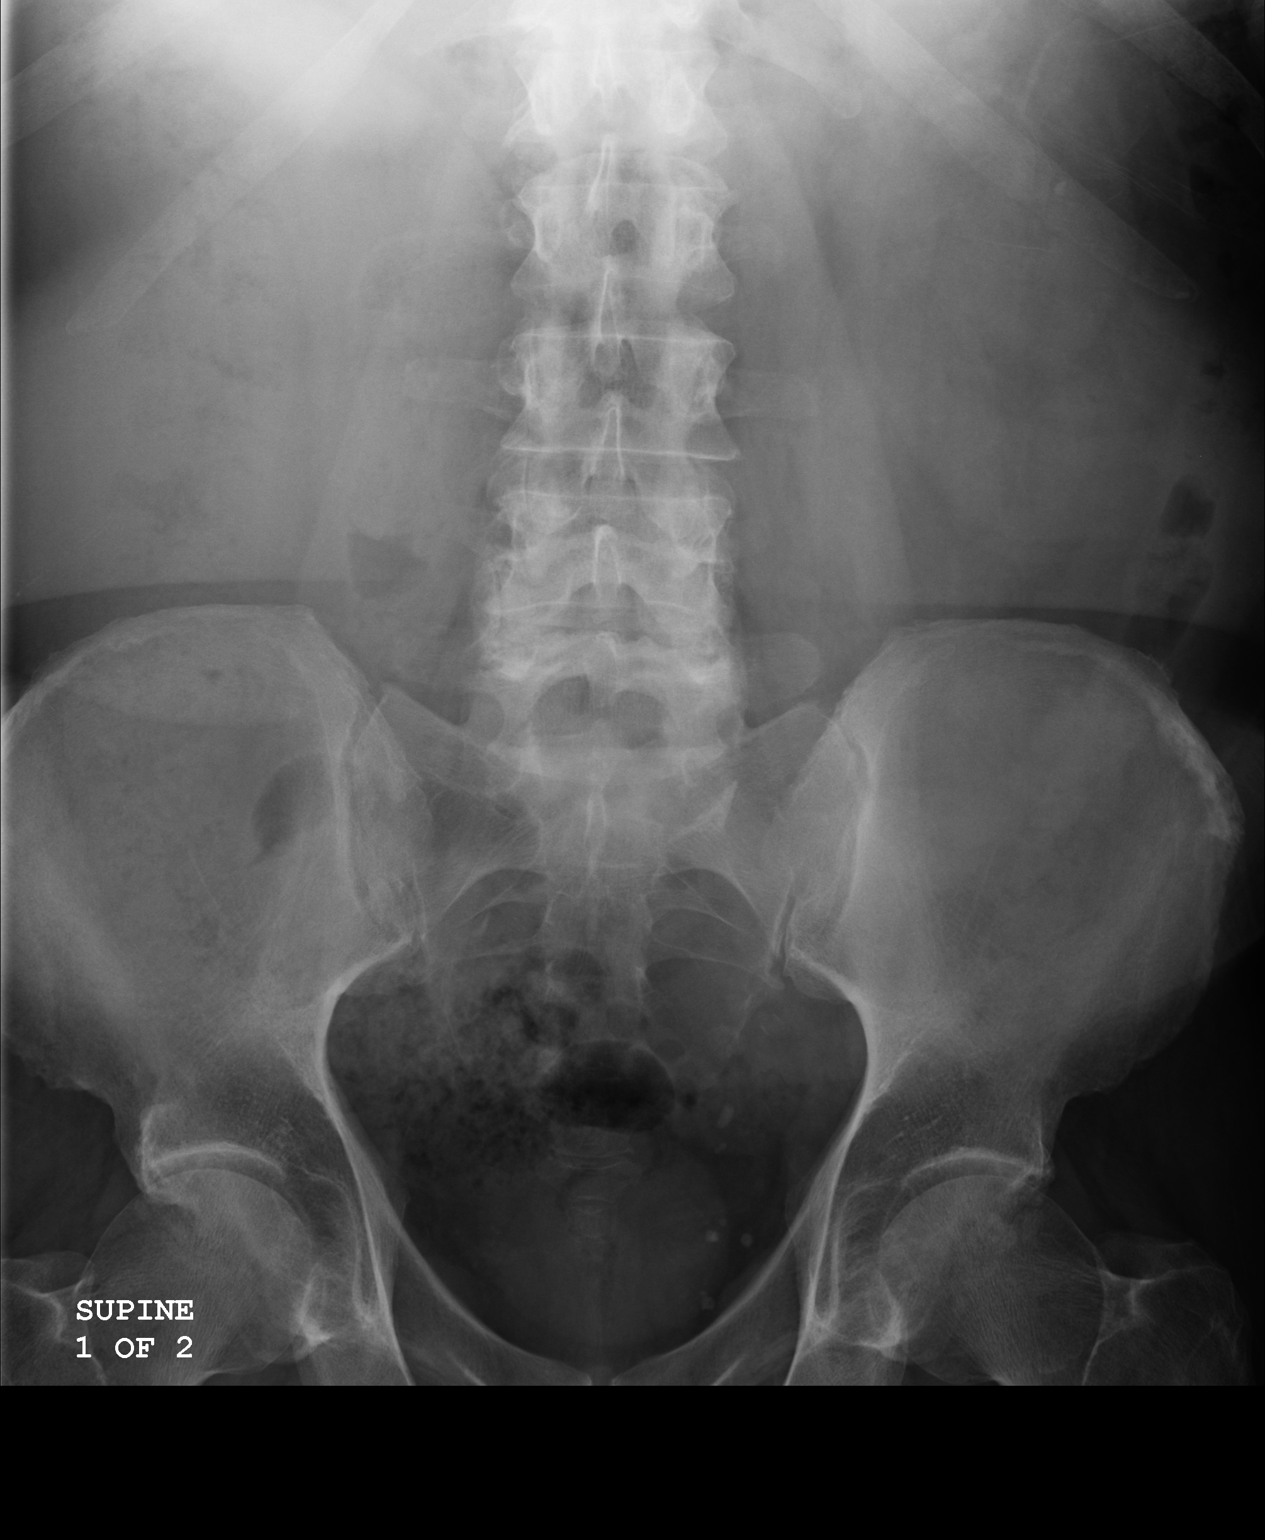
[im 2/2]
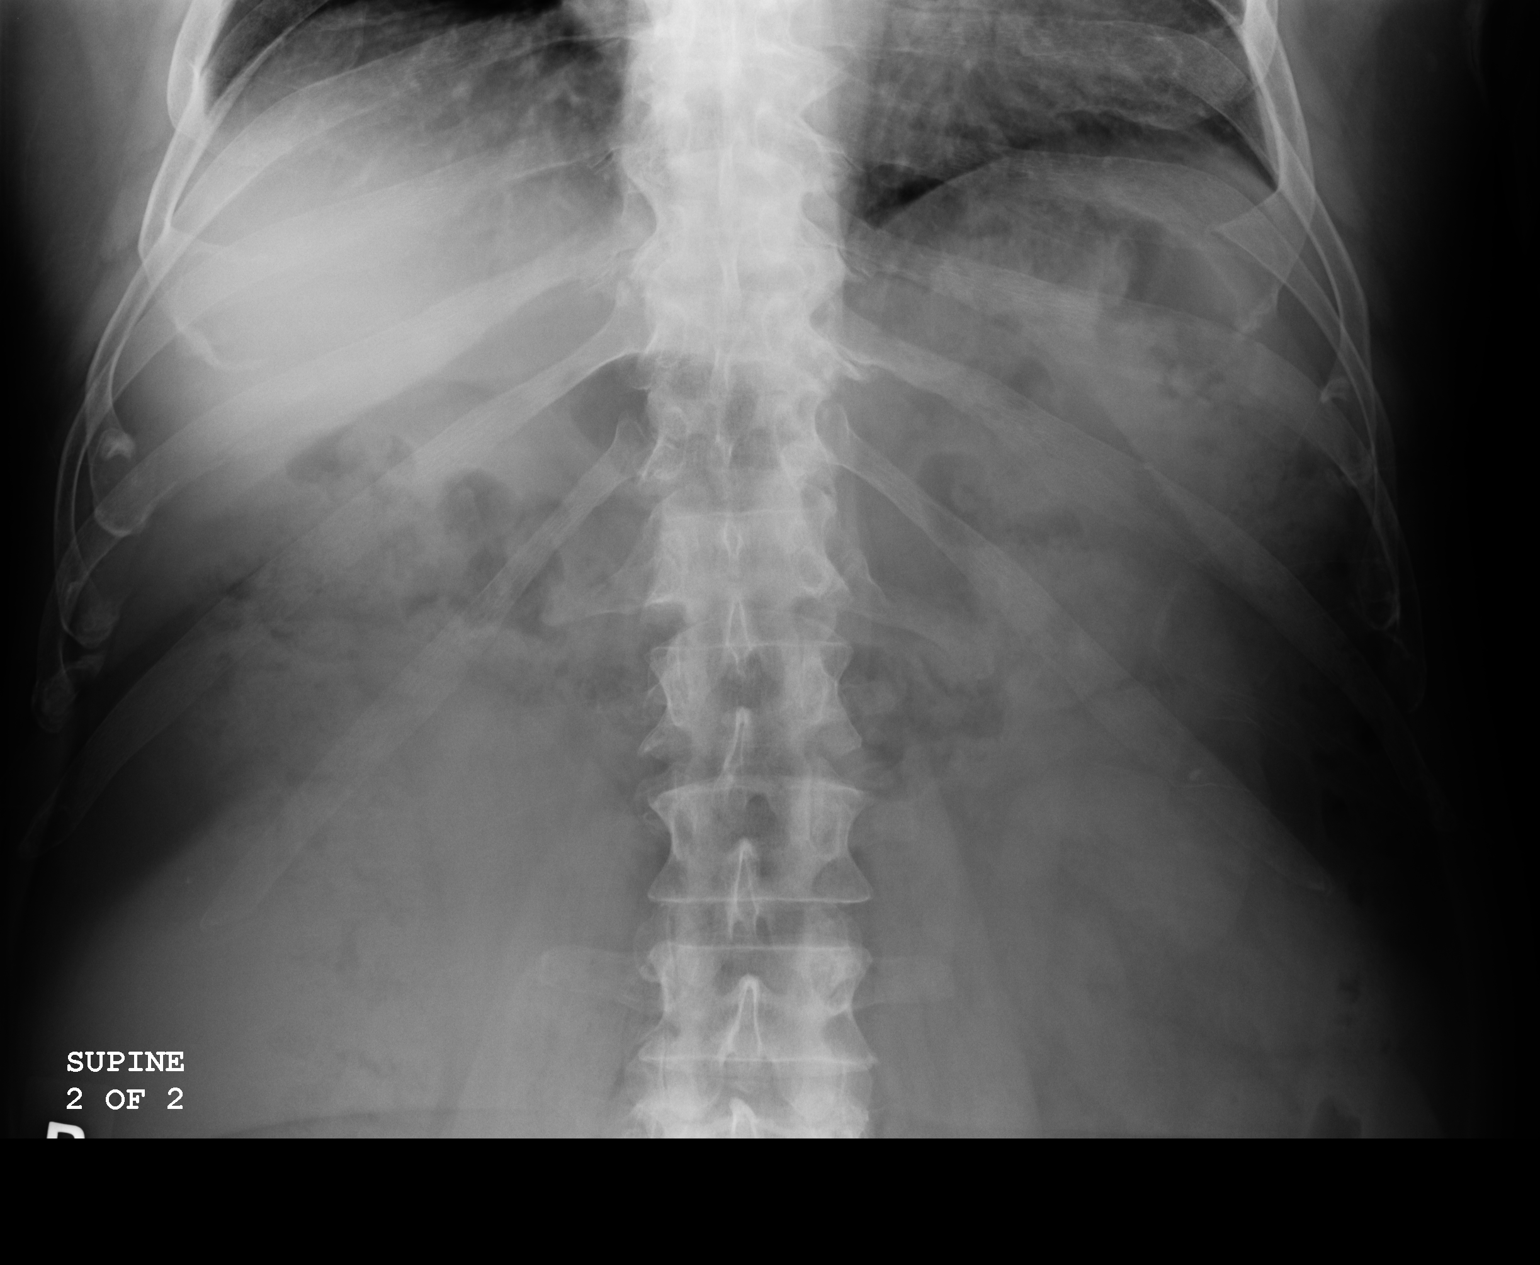

[2 of 2 positions shown; findings below may reference images not displayed]

PROCEDURE:     DXR - DXR KIDNEY URETER BLADDER  - October 22, 2009  [DATE]

RESULT:     Comparison is made to the study 03 September, 2009.

On the left projecting over the lower pole of the kidney there is a
calcification which measures approximately 2 x 3 mm. This is stable. On the
right I do not see definite calcified stones. Along the expected course of
the ureters there is a calcification on the left that has migrated distally
since the previous study. This may reflect a stone measuring as much as 2 x
6 mm.
IMPRESSION: There are stones projecting over the lower pole of the left
kidney. There is likely a distal ureteral stone on the left as well.
Phleboliths are present within the pelvis and appear stable.

## 2011-02-01 ENCOUNTER — Encounter: Payer: Self-pay | Admitting: Cardiology

## 2011-02-01 ENCOUNTER — Ambulatory Visit (INDEPENDENT_AMBULATORY_CARE_PROVIDER_SITE_OTHER): Payer: MEDICARE | Admitting: Cardiology

## 2011-02-01 DIAGNOSIS — I2510 Atherosclerotic heart disease of native coronary artery without angina pectoris: Secondary | ICD-10-CM

## 2011-02-01 DIAGNOSIS — E7800 Pure hypercholesterolemia, unspecified: Secondary | ICD-10-CM

## 2011-02-10 NOTE — Assessment & Plan Note (Signed)
Summary: F4M/AMD   Referring Provider:  Elmore Guise, MD Primary Provider:  Marga Melnick MD   History of Present Illness: 70 yo with CAD s/p unstable angina and left heart cath showing severe in-stent restenosis in the proximal RCA with placement of DES to the proximal RCA in 10/11.  Since that time he has been doing reasonably well.  He has had no exertional dyspnea or chest/neck pain (neck pain has been his anginal symptom in the past).  He has been walking for exercise, about 1.5 miles 2-3 times a week.  He is limited somewhat by knee pain but denies exertional dyspnea.  He has had some GERD symptoms that have resolved with Zantac.    ECG: NSR, LVH  Labs (11/11): LDL 54, HDL 40, LFTs normal  Current Medications (verified): 1)  Norvasc 10 Mg  Tabs (Amlodipine Besylate) .Marland Kitchen.. 1 By Mouth Daily 2)  Quinapril Hcl 40 Mg  Tabs (Quinapril Hcl) .... One Tablet Daily 3)  Metoprolol Tartrate 25 Mg Tabs (Metoprolol Tartrate) .Marland Kitchen.. 1 Two Times A Day 4)  Bufferin 325 Mg Tabs (Aspirin Buf(Cacarb-Mgcarb-Mgo)) .... Take One Tablet Once Daily 5)  Nitroglycerin 0.4 Mg/hr Pt24 (Nitroglycerin) .Marland Kitchen.. 1 Sl As Needed Chest Pain 6)  Lipitor 40 Mg Tabs (Atorvastatin Calcium) .Marland Kitchen.. 1 By Mouth Daily 7)  Effient 10 Mg Tabs (Prasugrel Hcl) .... Take One Tablet By Mouth Daily 8)  Viagra 100 Mg Tabs (Sildenafil Citrate) .... Do Not Take With Ntg; 1/2 -1 Once Daily As Needed 9)  Ranitidine Hcl 150 Mg Tabs (Ranitidine Hcl) .Marland Kitchen.. 1 Two Times A Day Pre Meals  Allergies (verified): No Known Drug Allergies  Past History:  Past Medical History: Reviewed history from 11/09/2010 and no changes required. 1. Hypertension 2. Nephrolithiasis. Dr. Achilles Dunk, [place] 3. Hyperlipidemia 4. Skin cancer,PMH  of, cell type unknown , Dr Margo Aye 5. Right shoulder pain, rotator cuff 6. CAD: Exertional chest pain, cath 12/10 showed EF 50-55% with mild inferior hypokinesis, 99% proximal and 60% distal RCA stenosis, 80% proximal stenosis of  a moderate-sized D1, 40% proximal LAD.  Patient had BMS x 2 placed in the RCA.  Unstable angina 10/11 with LHC showing 99% instent restenosis in proximal RCA.  Patient had PROMUS DES to the proximal RCA.  EF 60% on LV-gram.   Family History: Reviewed history from 11/09/2010 and no changes required. Father:? DM, MI at 53, smoker Mother: breast  & Gyn cancer Siblings: sister: breast cancer  Social History: Reviewed history from 11/09/2010 and no changes required. Retired, Theatre manager Married Alcohol use-yes:minimally (6 beers /year) Regular exercise-no Former Smoker; quit 1972  Vital Signs:  Patient profile:   70 year old male Height:      70.75 inches Weight:      216 pounds BMI:     30.45 Pulse rate:   62 / minute Resp:     16 per minute BP sitting:   132 / 74  (left arm)  Vitals Entered By: Marrion Coy, CNA (February 01, 2011 8:59 AM)  Physical Exam  General:  Well developed, well nourished, in no acute distress. Neck:  Neck supple, no JVD. No masses, thyromegaly or abnormal cervical nodes. Lungs:  Clear bilaterally to auscultation and percussion. Heart:  Non-displaced PMI, chest non-tender; regular rate and rhythm, S1, S2 without murmurs, rubs. Soft S4. Carotid upstroke normal, no bruit.  Pedals normal pulses. No edema, no varicosities. Abdomen:  Bowel sounds positive; abdomen soft and non-tender without masses, organomegaly, or hernias noted. No hepatosplenomegaly. Extremities:  No clubbing or cyanosis. Neurologic:  Alert and oriented x 3. Psych:  Normal affect.   Impression & Recommendations:  Problem # 1:  CAD, NATIVE VESSEL (ICD-414.01) Stable with no recurrent anginal pain.  Needs to continue Effient for at least a year.  OK to decrease ASA to 81 mg daily.  Continue metoprolol, quinapril, and statin.   Problem # 2:  HYPERLIPIDEMIA (ICD-272.4) LDL was at goal (<70) in 11/11.   Problem # 3:  HYPERTENSION (ICD-401.9) BP under good control.   Patient  Instructions: 1)  Your physician has recommended you make the following change in your medication:  2)  Decrease Aspirin to 81mg  daily--this should be enteric coated. 3)  Your physician wants you to follow-up in:6 months with Dr Shirlee Latch.[place] No 2012)   You will receive a reminder letter in the mail two months in advance. If you don't receive a letter, please call our office to schedule the follow-up appointment. Prescriptions: ATORVASTATIN CALCIUM 40 MG TABS (ATORVASTATIN CALCIUM) one daily  #90 x 3   Entered by:   Katina Dung, RN, BSN   Authorized by:   Marca Ancona, MD   Signed by:   Katina Dung, RN, BSN on 02/01/2011   Method used:   Electronically to        [facility]* (retail)             ,          Ph: [phone]       Fax: [phone]   RxID:   [id number]

## 2011-02-17 LAB — CBC
HCT: 39.5 % (ref 39.0–52.0)
HCT: 42.9 % (ref 39.0–52.0)
Hemoglobin: 13.9 g/dL (ref 13.0–17.0)
Hemoglobin: 15 g/dL (ref 13.0–17.0)
MCH: 32.1 pg (ref 26.0–34.0)
MCHC: 35 g/dL (ref 30.0–36.0)
MCV: 91.7 fL (ref 78.0–100.0)
Platelets: 132 K/uL — ABNORMAL LOW (ref 150–400)
RBC: 4.39 MIL/uL (ref 4.22–5.81)
RBC: 4.68 MIL/uL (ref 4.22–5.81)
RDW: 12.9 % (ref 11.5–15.5)
WBC: 6.5 K/uL (ref 4.0–10.5)

## 2011-02-17 LAB — BASIC METABOLIC PANEL WITH GFR
BUN: 10 mg/dL (ref 6–23)
BUN: 11 mg/dL (ref 6–23)
CO2: 23 meq/L (ref 19–32)
CO2: 25 meq/L (ref 19–32)
Calcium: 8.9 mg/dL (ref 8.4–10.5)
Calcium: 9.2 mg/dL (ref 8.4–10.5)
Chloride: 107 meq/L (ref 96–112)
Chloride: 111 meq/L (ref 96–112)
Creatinine, Ser: 1.06 mg/dL (ref 0.4–1.5)
Creatinine, Ser: 1.08 mg/dL (ref 0.4–1.5)
GFR calc Af Amer: >60 mL/min (ref >60)
GFR calc Af Amer: >60 mL/min (ref >60)
GFR calc non Af Amer: >60 mL/min (ref >60)
GFR calc non Af Amer: >60 mL/min (ref >60)
Glucose, Bld: 112 mg/dL — ABNORMAL HIGH (ref 70–99)
Glucose, Bld: 124 mg/dL — ABNORMAL HIGH (ref 70–99)
Potassium: 4.1 meq/L (ref 3.5–5.1)
Potassium: 4.2 meq/L (ref 3.5–5.1)
Sodium: 136 meq/L (ref 135–145)
Sodium: 139 meq/L (ref 135–145)

## 2011-02-17 LAB — CK TOTAL AND CKMB (NOT AT ARMC): CK, MB: 0.8 ng/mL (ref 0.3–4.0)

## 2011-02-17 LAB — CARDIAC PANEL(CRET KIN+CKTOT+MB+TROPI)
CK, MB: 0.8 ng/mL (ref 0.3–4.0)
Relative Index: INVALID (ref 0.0–2.5)
Total CK: 42 U/L (ref 7–232)
Total CK: 47 U/L (ref 7–232)
Troponin I: 0.03 ng/mL (ref 0.00–0.06)

## 2011-02-17 LAB — MRSA PCR SCREENING: MRSA by PCR: NEGATIVE

## 2011-02-17 LAB — APTT: aPTT: 24 s (ref 24–37)

## 2011-02-17 LAB — TROPONIN I: Troponin I: 0.02 ng/mL (ref 0.00–0.06)

## 2011-02-17 LAB — PROTIME-INR
INR: 0.94 (ref 0.00–1.49)
Prothrombin Time: 12.8 seconds (ref 11.6–15.2)

## 2011-03-08 LAB — BASIC METABOLIC PANEL
CO2: 26 mEq/L (ref 19–32)
Chloride: 108 mEq/L (ref 96–112)
GFR calc Af Amer: >60 mL/min (ref >60)
Sodium: 139 mEq/L (ref 135–145)

## 2011-03-08 LAB — CBC
HCT: 39.8 % (ref 39.0–52.0)
Hemoglobin: 13.9 g/dL (ref 13.0–17.0)
MCHC: 34.8 g/dL (ref 30.0–36.0)
MCV: 92.2 fL (ref 78.0–100.0)
RBC: 4.32 MIL/uL (ref 4.22–5.81)

## 2011-06-30 ENCOUNTER — Encounter: Payer: Self-pay | Admitting: Cardiology

## 2011-08-03 ENCOUNTER — Encounter: Payer: Self-pay | Admitting: Cardiology

## 2011-08-03 ENCOUNTER — Ambulatory Visit (INDEPENDENT_AMBULATORY_CARE_PROVIDER_SITE_OTHER): Payer: Medicare Other | Admitting: Cardiology

## 2011-08-03 DIAGNOSIS — E785 Hyperlipidemia, unspecified: Secondary | ICD-10-CM

## 2011-08-03 DIAGNOSIS — I739 Peripheral vascular disease, unspecified: Secondary | ICD-10-CM

## 2011-08-03 DIAGNOSIS — I2510 Atherosclerotic heart disease of native coronary artery without angina pectoris: Secondary | ICD-10-CM

## 2011-08-03 MED ORDER — PRASUGREL HCL 10 MG PO TABS: 10.0000 mg | ORAL_TABLET | Freq: Every day | ORAL | Status: DC

## 2011-08-03 MED ORDER — ROSUVASTATIN CALCIUM 10 MG PO TABS: 10.0000 mg | ORAL_TABLET | Freq: Every day | ORAL | Status: DC

## 2011-08-03 NOTE — Progress Notes (Signed)
PCP: Dr. Alwyn Ren  70 yo with CAD with severe in-stent restenosis in the proximal RCA in 10/11 with placement of DES to the proximal RCA.  Main concern has been leg pain that he has been having chronically.  The pain has been in the left thigh and hip and the bilateral knees.  To a lesser extent he will sometimes get calf pain.  He tried stopping Lipitor for about 10 days with no relief so restarted it.  The symptoms actually have been better for the last 3-4 weeks, however.   Patient is easily fatigued.  He gets occasional pain in the maxillary sinus area.  This worries him because his past angina radiated to the maxillary sinus area.  He does not have any chest, neck, or arm pain.  Symptoms are not exertional and are sometime associated with headaches.  He has chronic stable mild exertional dyspnea with activities like climbing a flight of steps at a moderate pace.    ECG: NSR, LVH   Allergies (verified):  No Known Drug Allergies   Past Medical History:  1. Hypertension  2. Nephrolithiasis.  3. Hyperlipidemia  4. Skin cancer, hx of, cell type unknown , Dr Margo Aye  5. Right shoulder pain, rotator cuff  6. CAD: Exertional chest pain, cath 12/10 showed EF 50-55% with mild inferior hypokinesis, 99% proximal and 60% distal RCA stenosis, 80% proximal stenosis of a moderate-sized D1, 40% proximal LAD. Patient had BMS x 2 placed in the RCA. Unstable angina 10/11 with LHC showing 99% instent restenosis in proximal RCA. Patient had PROMUS DES to the proximal RCA. EF 60% on LV-gram.   Family History:  Father:? DM, MI at 26, smoker  Mother: breast CA, Gyn CA  Siblings: sister breast CA   Social History:  Retired, Theatre manager  Married  Alcohol use-yes:minimally (6 beers /year)  Regular exercise-no  Former Smoker; quit 1982   Review of Systems  All systems reviewed and negative except as per HPI.   Current Outpatient Prescriptions  Medication Sig Dispense Refill  . amLODipine (NORVASC) 10  MG tablet Take 10 mg by mouth daily.        Marland Kitchen aspirin 81 MG tablet Take 81 mg by mouth daily.        . metoprolol tartrate (LOPRESSOR) 25 MG tablet Take 25 mg by mouth 2 (two) times daily.        . nitroGLYCERIN (NITROSTAT) 0.4 MG SL tablet Place 0.4 mg under the tongue every 5 (five) minutes as needed.        Marland Kitchen omeprazole (PRILOSEC) 20 MG capsule Take 20 mg by mouth daily.        . prasugrel (EFFIENT) 10 MG TABS Take 1 tablet (10 mg total) by mouth daily.  90 tablet  3  . quinapril (ACCUPRIL) 40 MG tablet Take 40 mg by mouth daily.        . sildenafil (VIAGRA) 100 MG tablet Take 100 mg by mouth daily as needed. DO NOT TAKE WITH NTG       . Coenzyme Q10 200 MG TABS Take by mouth.    0  . rosuvastatin (CRESTOR) 10 MG tablet Take 1 tablet (10 mg total) by mouth at bedtime.  90 tablet  3    BP 122/76  Pulse 63  Ht 5\' 10"  (1.778 m)  Wt 218 lb (98.884 kg)  BMI 31.28 kg/m2 General: NAD, obese.  Neck: No JVD, no thyromegaly or thyroid nodule.  Lungs: Clear to auscultation bilaterally with normal  respiratory effort. CV: Nondisplaced PMI.  Heart regular S1/S2, no S3/S4, no murmur.  Trace ankle edema.  No carotid bruit.  2+ left DP pulse.  Unable to feel right pedal pulses.   Abdomen: Soft, nontender, no hepatosplenomegaly, no distention.   Neurologic: Alert and oriented x 3.  Psych: Normal affect. Extremities: No clubbing or cyanosis.

## 2011-08-03 NOTE — Assessment & Plan Note (Signed)
Patient has significant pain in his legs (muscles, knees, hip) and decreased pedal pulses (nonpalpable on the right).  I would like to rule out PAD as a contributor to his symptoms so will order a peripheral arterial doppler ultrasound evaluation.

## 2011-08-03 NOTE — Assessment & Plan Note (Signed)
Patient has rather diffuse pain in his legs involving the knees and left hip but to a lesser extent also the muscles.  Lipitor could certainly play a role.  I will have him stop Lipitor for 5 days and start coenzyme Q10 200 mg daily.  At the end of that time, he will start on Crestor 10 mg daily.  Hopefully this will be better tolerated.  Lipids/LFTs in 2 months.

## 2011-08-03 NOTE — Patient Instructions (Signed)
Stop lipitor.  After you have been off Lipitor for 5 days start Crestor 10mg  daily.  Take coenzyme Q 10 200mg  daily.  Schedule an appointment for a lower extremity arterial doppler.  Your physician recommends that you return for a FASTING lipid profile/liver profile/BMP in 2 months--414.01  Schedule an appointment to see Dr Shirlee Latch in 6 weeks.

## 2011-08-03 NOTE — Assessment & Plan Note (Signed)
DES to proximal RCA in-stent restenois in 10/11.  Patient's anginal equivalent has involved radiation to the maxillary sinus area in the past, and he has been having maxillary sinus area pain from time recently.  It has not been associated with chest pain or exertion and has been associated with a headache.  Pain is very atypical now and could be sinus congestion from allergies, etc.  He will continue ASA 81, Effient, metoprolol, statin, and ACEI.  Given in-stent restenosis, I will plan on continuing Effient for longer than a year if he tolerates it.  I am going to bring him back in 6 weeks for close followup given the facial pain.  He will call if there is clinical worsening.

## 2011-08-04 ENCOUNTER — Encounter (INDEPENDENT_AMBULATORY_CARE_PROVIDER_SITE_OTHER): Payer: Medicare Other | Admitting: Cardiology

## 2011-08-04 DIAGNOSIS — I2510 Atherosclerotic heart disease of native coronary artery without angina pectoris: Secondary | ICD-10-CM

## 2011-08-04 DIAGNOSIS — I70219 Atherosclerosis of native arteries of extremities with intermittent claudication, unspecified extremity: Secondary | ICD-10-CM

## 2011-08-04 DIAGNOSIS — I739 Peripheral vascular disease, unspecified: Secondary | ICD-10-CM

## 2011-08-22 ENCOUNTER — Other Ambulatory Visit: Payer: Medicare Other | Admitting: *Deleted

## 2011-09-21 ENCOUNTER — Other Ambulatory Visit (INDEPENDENT_AMBULATORY_CARE_PROVIDER_SITE_OTHER): Payer: Medicare Other | Admitting: *Deleted

## 2011-09-21 DIAGNOSIS — I2510 Atherosclerotic heart disease of native coronary artery without angina pectoris: Secondary | ICD-10-CM

## 2011-09-21 DIAGNOSIS — I739 Peripheral vascular disease, unspecified: Secondary | ICD-10-CM

## 2011-09-21 LAB — LIPID PANEL
LDL Cholesterol: 46 mg/dL (ref 0–99)
Total CHOL/HDL Ratio: 2

## 2011-09-21 LAB — HEPATIC FUNCTION PANEL
Albumin: 3.8 g/dL (ref 3.5–5.2)
Alkaline Phosphatase: 68 U/L (ref 39–117)
Bilirubin, Direct: 0.1 mg/dL (ref 0.0–0.3)
Total Bilirubin: 0.7 mg/dL (ref 0.3–1.2)

## 2011-09-21 LAB — BASIC METABOLIC PANEL
BUN: 18 mg/dL (ref 6–23)
CO2: 24 mEq/L (ref 19–32)
Chloride: 107 mEq/L (ref 96–112)
Creatinine, Ser: 1.2 mg/dL (ref 0.4–1.5)

## 2011-09-22 ENCOUNTER — Ambulatory Visit (INDEPENDENT_AMBULATORY_CARE_PROVIDER_SITE_OTHER): Payer: Medicare Other | Admitting: Cardiology

## 2011-09-22 ENCOUNTER — Encounter: Payer: Self-pay | Admitting: Cardiology

## 2011-09-22 DIAGNOSIS — I10 Essential (primary) hypertension: Secondary | ICD-10-CM

## 2011-09-22 DIAGNOSIS — I2510 Atherosclerotic heart disease of native coronary artery without angina pectoris: Secondary | ICD-10-CM

## 2011-09-22 DIAGNOSIS — E785 Hyperlipidemia, unspecified: Secondary | ICD-10-CM

## 2011-09-22 DIAGNOSIS — R5381 Other malaise: Secondary | ICD-10-CM

## 2011-09-22 DIAGNOSIS — R5383 Other fatigue: Secondary | ICD-10-CM

## 2011-09-22 MED ORDER — FLUTICASONE PROPIONATE 50 MCG/ACT NA SUSP: 2.0000 | Freq: Every day | NASAL | Status: DC

## 2011-09-22 NOTE — Patient Instructions (Signed)
Try Flonase nasal spray for your nasal congestion symptoms.  Your physician wants you to follow-up in: 3 months with Dr Shirlee Latch. ( January 2013) .You will receive a reminder letter in the mail two months in advance. If you don't receive a letter, please call our office to schedule the follow-up appointment.

## 2011-09-23 DIAGNOSIS — R5383 Other fatigue: Secondary | ICD-10-CM | POA: Insufficient documentation

## 2011-09-23 NOTE — Assessment & Plan Note (Signed)
Myalgias resolved off Lipitor.  He is tolerating Crestor with LDL at goal.

## 2011-09-23 NOTE — Progress Notes (Signed)
PCP: Dr. Alwyn Ren  70 yo with CAD with severe in-stent restenosis in the proximal RCA in 10/11 with placement of DES to the proximal RCA returns for followup.  Since changing his statin to Crestor, myalgias have improved, and he is getting more exercise.  He walks about 1.5 miles once a week.  He feels fatigued by the time he finishes the walk but denies chest pain or dyspnea.  He also notes easy fatiguability with yardwork.  He continues to have sinus pressure, congestion, and headaches.  He has been concerned because he had sinus-type symptoms in the past when he was found to have coronary disease.    Labs (10/12): LDL 46, HDL 44, K 4.3, creatinine 1.2  Allergies (verified):  No Known Drug Allergies   Past Medical History:  1. Hypertension  2. Nephrolithiasis.  3. Hyperlipidemia: Myalgias with Lipitor.  4. Skin cancer, hx of, cell type unknown , Dr Margo Aye  5. Right shoulder pain, rotator cuff  6. CAD: Exertional chest pain, cath 12/10 showed EF 50-55% with mild inferior hypokinesis, 99% proximal and 60% distal RCA stenosis, 80% proximal stenosis of a moderate-sized D1, 40% proximal LAD. Patient had BMS x 2 placed in the RCA. Unstable angina 10/11 with LHC showing 99% instent restenosis in proximal RCA. Patient had PROMUS DES to the proximal RCA. EF 60% on LV-gram.  7. Allergic rhinitis 8. ABIs normal in 8/12.   Family History:  Father:? DM, MI at 31, smoker  Mother: breast CA, Gyn CA  Siblings: sister breast CA   Social History:  Retired, Theatre manager  Married  Alcohol use-yes:minimally (6 beers /year)  Regular exercise-no  Former Smoker; quit 1982    Current Outpatient Prescriptions  Medication Sig Dispense Refill  . amLODipine (NORVASC) 10 MG tablet Take 10 mg by mouth daily.        Marland Kitchen aspirin 81 MG tablet Take 81 mg by mouth daily.        . Coenzyme Q10 200 MG TABS Take by mouth.    0  . metoprolol tartrate (LOPRESSOR) 25 MG tablet Take 25 mg by mouth 2 (two) times daily.         . nitroGLYCERIN (NITROSTAT) 0.4 MG SL tablet Place 0.4 mg under the tongue every 5 (five) minutes as needed.        Marland Kitchen omeprazole (PRILOSEC) 20 MG capsule Take 20 mg by mouth daily.        . prasugrel (EFFIENT) 10 MG TABS Take 1 tablet (10 mg total) by mouth daily.  90 tablet  3  . quinapril (ACCUPRIL) 40 MG tablet Take 40 mg by mouth daily.        . rosuvastatin (CRESTOR) 10 MG tablet Take 1 tablet (10 mg total) by mouth at bedtime.  90 tablet  3  . sildenafil (VIAGRA) 100 MG tablet Take 100 mg by mouth daily as needed. DO NOT TAKE WITH NTG       . fluticasone (FLONASE) 50 MCG/ACT nasal spray Place 2 sprays into the nose daily.  16 g  0    BP 130/70  Pulse 68  Ht 5\' 10"  (1.778 m)  Wt 218 lb (98.884 kg)  BMI 31.28 kg/m2 General: NAD, obese.  Neck: No JVD, no thyromegaly or thyroid nodule.  Lungs: Clear to auscultation bilaterally with normal respiratory effort. CV: Nondisplaced PMI.  Heart regular S1/S2, no S3/S4, no murmur.  Trace ankle edema.  No carotid bruit.  2+ left DP pulse.  Unable to feel right  pedal pulses.   Abdomen: Soft, nontender, no hepatosplenomegaly, no distention.   Neurologic: Alert and oriented x 3.  Psych: Normal affect. Extremities: No clubbing or cyanosis.

## 2011-09-23 NOTE — Assessment & Plan Note (Signed)
BP seems to be under good control.  

## 2011-09-23 NOTE — Assessment & Plan Note (Signed)
With the combination of myalgias from Lipitor and knee arthritis, he had been fairly inactive.  I think that a lot of the fatigue he has noted may be due to deconditioning.  He is able to walk 1.5 miles without dyspnea or chest pain but does feel tired when he finishes.  I would like him to be more regular with his walking, aim for 5-6 times a week.

## 2011-09-23 NOTE — Assessment & Plan Note (Signed)
DES to proximal RCA in-stent restenois in 10/11.  At the time of his last cardiac intervention, patient was having pain in the area of his maxillary sinuses.  He continues to have rather chronic maxillary fullness and congestion, has been worse during the fall.  He has some fatigue but no significant exertional dyspnea or chest pain.  I think that his maxillary sinus fullness/congestion most likely represents allergic rhinitis.  I am going to have him try Flonase nasal spray to see if this helps the upper respiratory symptoms.  I am going to continue Effient probably for another year as long as he does not have bleeding complications.  I will also continue metoprolol, quinapril, Crestor.

## 2011-11-16 ENCOUNTER — Encounter: Payer: Self-pay | Admitting: Internal Medicine

## 2011-11-16 ENCOUNTER — Ambulatory Visit (INDEPENDENT_AMBULATORY_CARE_PROVIDER_SITE_OTHER): Payer: Medicare Other | Admitting: Internal Medicine

## 2011-11-16 VITALS — BP 118/80 | HR 66 | Temp 98.2°F | Resp 12 | Ht 70.75 in | Wt 219.4 lb

## 2011-11-16 DIAGNOSIS — I10 Essential (primary) hypertension: Secondary | ICD-10-CM

## 2011-11-16 DIAGNOSIS — R7309 Other abnormal glucose: Secondary | ICD-10-CM

## 2011-11-16 DIAGNOSIS — E785 Hyperlipidemia, unspecified: Secondary | ICD-10-CM

## 2011-11-16 DIAGNOSIS — Z23 Encounter for immunization: Secondary | ICD-10-CM

## 2011-11-16 LAB — HEMOGLOBIN A1C: Hgb A1c MFr Bld: 5.7 % (ref 4.6–6.5)

## 2011-11-16 LAB — TSH: TSH: 2.49 u[IU]/mL (ref 0.35–5.50)

## 2011-11-16 MED ORDER — AMLODIPINE BESYLATE 10 MG PO TABS: 10.0000 mg | ORAL_TABLET | Freq: Every day | ORAL | Status: DC

## 2011-11-16 MED ORDER — QUINAPRIL HCL 40 MG PO TABS: 40.0000 mg | ORAL_TABLET | Freq: Every day | ORAL | Status: DC

## 2011-11-16 MED ORDER — METOPROLOL TARTRATE 25 MG PO TABS: 25.0000 mg | ORAL_TABLET | Freq: Two times a day (BID) | ORAL | Status: DC

## 2011-11-16 MED ORDER — SILDENAFIL CITRATE 100 MG PO TABS: 100.0000 mg | ORAL_TABLET | Freq: Every day | ORAL | Status: DC | PRN

## 2011-11-16 NOTE — Progress Notes (Signed)
Subjective:    Patient ID: Peter Blair, male    DOB: [birth year], 70 y.o.   MRN: [id number]  HPI Medicare Wellness Visit:  The following psychosocial & medical history were reviewed as required by Medicare.   Social history: caffeine: 2 glasses tea/ night , alcohol:  no ,  tobacco use : quit 1972  & exercise : not regularly.   Home & personal  safety / fall risk: no issues, activities of daily living: no limitations , seatbelt use : yes , and smoke alarm employment : yes .  Power of Attorney/Living Will status : in place  Vision ( as recorded per Nurse) & Hearing  evaluation :  Ophth exam 2008. Wall chart read with lenses & whisper heard @ 6 ft. Orientation :oriented X3 , memory & recall :good,  math testing: good,and mood & affect : normal . Depression / anxiety: denied Travel history : [place] , immunization status :Pneumovax 10/11 , transfusion history:  no, and preventive health surveillance ( colonoscopies, BMD , etc as per protocol/ SOC): no colonoscopy due to cardiac issues (SOC discussed previously & today), Dental care:  Seen 2 weeks ago . Chart reviewed &  Updated. Active issues reviewed & addressed.       Review of Systems HYPERTENSION: Disease Monitoring: Blood pressure average:126.92/70; range 103/59- 147/78 Chest pain, palpitations- no;Cardiologist seen 2 mos ago; appt in 01/13       Dyspnea- no Medications: Compliance- yes  Lightheadedness,Syncope- no    Edema- no  FASTING HYPERGLYCEMIA: Disease Monitoring: Blood Sugar ranges-not monitored  Polyuria/phagia/dipsia- no       Visual problems- no Medications: Compliance- no meds ; no diet   HYPERLIPIDEMIA: Disease Monitoring: See symptoms for Hypertension Medications: Compliance- yes  Abd pain, bowel changes- no   Muscle aches- not since Lipitor D/Ced by Dr Shirlee Latch          Objective:   Physical Exam Gen.: Healthy and well-nourished in appearance. Alert, appropriate and cooperative throughout  exam. Head: Normocephalic without obvious abnormalities;  pattern alopecia  Eyes: No corneal or conjunctival inflammation noted. Pupils equal round reactive to light and accommodation.  Extraocular motion intact. Ears: External  ear exam reveals no significant lesions or deformities. Canals; wax R > L  Nose: External nasal exam reveals no deformity or inflammation. Nasal mucosa are pink and moist. No lesions or exudates noted. Septum deviated to R  Mouth: Oral mucosa and oropharynx reveal no lesions or exudates. Teeth in good repair. Neck: No deformities, masses, or tenderness noted. Range of motion &  Thyroid normal. Lungs: Normal respiratory effort; chest expands symmetrically. Lungs are clear to auscultation without rales, wheezes, or increased work of breathing. Heart: Normal rate and rhythm. Normal S1 and S2. No gallop, click, or rub. S 4 w/o  Murmur. Heart sounds distant Abdomen: Bowel sounds normal; abdomen soft and nontender. No masses, organomegaly or hernias noted. Genitalia: He has a complicated genitourinary history. He has a renal cyst; ongoing monitor was recommended by Dr. Wanda Plump. Has a history of BPH, urethral stricture (status post dilation) and nephrolithiasis. Clinically ongoing urologic monitor is indicated.    Marland Kitchen  Musculoskeletal/extremities: No deformity or scoliosis noted of  the thoracic or lumbar spine. No clubbing, cyanosis, edema, or deformity noted. Range of motion  normal .Tone & strength  normal.Joints normal. Nail health  good. Vascular: Carotid, radial artery, dorsalis pedis and  posterior tibial pulses are full and equal. No bruits present. Neurologic: Alert and oriented x3. Deep tendon reflexes symmetrical and normal.          Skin: Intact without suspicious lesions or rashes. Lymph: No cervical, axillary, or inguinal lymphadenopathy present. Psych: Mood and affect are normal.  Normally interactive                                                                                         Assessment & Plan:  #1 Medicare Wellness Exam; criteria met ; data entered #2 Problem List reviewed ; Assessment/ Recommendations made.  His blood pressure is well controlled no changes indicated his medications. He is long overdue for colonoscopy; he will need to be cleared by his cardiologist prior to this screening procedure.  I recommend that he establish with a urologist since Dr. Wanda Plump has relocated. The status of the renal cyst should be reevaluated. Plan: see Orders

## 2011-11-16 NOTE — Patient Instructions (Addendum)
Preventive Health Care: Exercise at least 30-45 minutes a day,  3-4 days a week.  Eat a low-fat diet with lots of fruits and vegetables, up to 7-9 servings per day. Avoid obesity; your goal is waist measurement < 40 inches.Consume less than 40 grams of sugar per day from foods & drinks with High Fructose Corn Sugar as #1,2,3 or # 4 on label. Eye Doctor - have an eye exam @ least annually.                                                         Blood Pressure Goal  Ideally is an AVERAGE < 135/85. This AVERAGE should be calculated from @ least 5-7 BP readings taken @ different times of day on different days of week. You should not respond to isolated BP readings , but rather the AVERAGE for that week.  As per the Standard of Care , screening Colonoscopy recommended @ 50 & every 5-10 years thereafter . More frequent monitor would be dictated by family history or findings @ Colonoscopy . Please discuss this with the cardiologist for preprocedure clearance &  adjustments of medicines if needed. Please contact the  Urologist Dr. Wanda Plump recommended for followup of the renal cyst.

## 2012-01-30 ENCOUNTER — Encounter: Payer: Self-pay | Admitting: Internal Medicine

## 2012-01-30 ENCOUNTER — Ambulatory Visit (INDEPENDENT_AMBULATORY_CARE_PROVIDER_SITE_OTHER): Payer: PRIVATE HEALTH INSURANCE | Admitting: Internal Medicine

## 2012-01-30 VITALS — BP 114/76 | HR 69 | Temp 98.5°F | Wt 218.0 lb

## 2012-01-30 DIAGNOSIS — H1030 Unspecified acute conjunctivitis, unspecified eye: Secondary | ICD-10-CM

## 2012-01-30 DIAGNOSIS — R109 Unspecified abdominal pain: Secondary | ICD-10-CM

## 2012-01-30 LAB — CBC WITH DIFFERENTIAL/PLATELET
Basophils Absolute: 0 10*3/uL (ref 0.0–0.1)
HCT: 46 % (ref 39.0–52.0)
Hemoglobin: 15.7 g/dL (ref 13.0–17.0)
Lymphs Abs: 1.4 10*3/uL (ref 0.7–4.0)
Monocytes Relative: 7.6 % (ref 3.0–12.0)
Neutro Abs: 4.9 10*3/uL (ref 1.4–7.7)
RDW: 13.6 % (ref 11.5–14.6)

## 2012-01-30 LAB — HEPATIC FUNCTION PANEL
ALT: 29 U/L (ref 0–53)
AST: 22 U/L (ref 0–37)
Alkaline Phosphatase: 66 U/L (ref 39–117)
Bilirubin, Direct: 0 mg/dL (ref 0.0–0.3)
Total Protein: 7.4 g/dL (ref 6.0–8.3)

## 2012-01-30 LAB — POCT URINALYSIS DIPSTICK
Blood, UA: NEGATIVE
Protein, UA: NEGATIVE
Spec Grav, UA: 1.025
Urobilinogen, UA: 0.2
pH, UA: 6

## 2012-01-30 MED ORDER — TRAMADOL HCL 50 MG PO TABS: 50.0000 mg | ORAL_TABLET | Freq: Four times a day (QID) | ORAL | Status: AC | PRN

## 2012-01-30 MED ORDER — ERYTHROMYCIN 5 MG/GM OP OINT: TOPICAL_OINTMENT | Freq: Four times a day (QID) | OPHTHALMIC | Status: AC

## 2012-01-30 NOTE — Progress Notes (Signed)
  Subjective:    Patient ID: Peter Blair, male    DOB: February 03, 1941, 71 y.o.   MRN: [id number]  HPI ABDOMINAL PAIN: Location: mid abdomen   Onset: early this month L lateral abdomen  Radiation:bilaterally & in back Severity: up to 8 Quality:sore but tender to palpation Duration: 24/7 Better with: NSAIDS Worse with:bouncy car ride Symptoms Nausea/Vomiting: no Diarrhea: no Constipation: no Melena/BRBPR: no Hematemesis: no Anorexia: no Fever/Chills:no Dysuria/hematuria/pyuria: no Rash:no Wt loss: no    Past Surgeries: Remotely he had a flexible sigmoidoscopy. Has not elected to pursue colonoscopy.  He has a history of reflux; otherwise his history and family history is negative for gastrointestinal disease.       Review of Systems He had some conjunctivitis-type symptoms last week in both eyes. Now his wife has it. This was treated with compresses  He's had unilateral sore throat for several days.     Objective:   Physical Exam General appearance is one of good health and nourishment w/o distress.  Eyes: Mild  conjunctival inflammation w/o purulence;no scleral icterus is present.  Oral exam: Dental hygiene is good; lips and gums are healthy appearing.There is mild oropharyngeal erythema ; no exudate noted.   Heart:  Normal rate and regular rhythm. S1 and S2 normal without gallop, murmur, click, rub or other extra sounds     Lungs:Chest clear to auscultation; no wheezes, rhonchi,rales ,or rubs present.No increased work of breathing.   Abdomen: bowel sounds normal, soft tender in R lateral mid abdomen  without masses, organomegaly.large ventral hernia noted.  No guarding or rebound. There is no tenderness in the right upper quadrant   Skin:Warm & dry.  Intact without suspicious lesions or rashes ; no jaundice or tenting  Lymphatic: No lymphadenopathy is noted about the head, neck, axilla areas.   Muscle skeletal: Minimal tenderness to percussion right flank. The  formal right thumbnail. Strength and tone normal. Negative straight leg raising bilaterally.  Neuro: Deep tendon reflexes normal                Assessment & Plan:  #1 abdominal pain, diffuse. Tenderness localized to the right lateral abdomen today.  #2 conjunctivitis  Plan: See orders and recommendations

## 2012-01-30 NOTE — Patient Instructions (Signed)
Please complete stool cards.  As per the Standard of Care , screening Colonoscopy recommended @ 50 & every 5-10 years thereafter . More frequent monitor would be dictated by family history or findings @ Colonoscopy .Please discuss clearance with Dr Shirlee Latch when seen due to Effient therapy

## 2012-02-03 ENCOUNTER — Other Ambulatory Visit (INDEPENDENT_AMBULATORY_CARE_PROVIDER_SITE_OTHER): Payer: PRIVATE HEALTH INSURANCE

## 2012-02-03 DIAGNOSIS — Z1289 Encounter for screening for malignant neoplasm of other sites: Secondary | ICD-10-CM

## 2012-02-16 ENCOUNTER — Telehealth: Payer: Self-pay | Admitting: Cardiology

## 2012-02-16 NOTE — Telephone Encounter (Signed)
Talked with pt's wife. Pt's wife states pt has not had any new cardiac symptoms. Wife is aware of Dr Alford Highland recommendations for holding Effient.

## 2012-02-16 NOTE — Telephone Encounter (Signed)
Pt needs to have a colonoscopy and they need to know if pt needs to be seen prior to scheduling it or what they need to do. If not at home number plz call [phone]

## 2012-02-16 NOTE — Telephone Encounter (Signed)
If no new symptoms, ok to hold Effient if needed 5 days prior to colonoscopy and restart afterwards.

## 2012-02-17 ENCOUNTER — Encounter: Payer: Self-pay | Admitting: Gastroenterology

## 2012-03-05 HISTORY — PX: OTHER SURGICAL HISTORY: SHX169

## 2012-03-07 ENCOUNTER — Encounter: Payer: Self-pay | Admitting: *Deleted

## 2012-03-08 ENCOUNTER — Telehealth: Payer: Self-pay | Admitting: Cardiology

## 2012-03-08 ENCOUNTER — Encounter: Payer: Self-pay | Admitting: Gastroenterology

## 2012-03-08 ENCOUNTER — Ambulatory Visit (INDEPENDENT_AMBULATORY_CARE_PROVIDER_SITE_OTHER): Payer: 59 | Admitting: Gastroenterology

## 2012-03-08 VITALS — BP 110/62 | HR 72 | Ht 70.0 in | Wt 220.8 lb

## 2012-03-08 DIAGNOSIS — I2510 Atherosclerotic heart disease of native coronary artery without angina pectoris: Secondary | ICD-10-CM

## 2012-03-08 DIAGNOSIS — Z7901 Long term (current) use of anticoagulants: Secondary | ICD-10-CM

## 2012-03-08 DIAGNOSIS — Z800 Family history of malignant neoplasm of digestive organs: Secondary | ICD-10-CM

## 2012-03-08 DIAGNOSIS — K219 Gastro-esophageal reflux disease without esophagitis: Secondary | ICD-10-CM

## 2012-03-08 MED ORDER — PEG-KCL-NACL-NASULF-NA ASC-C 100 G PO SOLR: 1.0000 | Freq: Once | ORAL | Status: DC

## 2012-03-08 NOTE — Telephone Encounter (Signed)
Ok if doing the other procedure 2 days later .

## 2012-03-08 NOTE — Patient Instructions (Addendum)
Your Procedure is schedule for 03/14/2012, please follow the separate instructions given. Your prep has been sent to your pharmacy.

## 2012-03-08 NOTE — Telephone Encounter (Signed)
Spoke with wife. She is aware of Dr Alford Highland recommendation.

## 2012-03-08 NOTE — Telephone Encounter (Signed)
pts wife calling re getting ok to stop blood thinner for 7 days instead of 5 as requested before due to another procedure two days after the colonoscopy, pt [phone]

## 2012-03-08 NOTE — Progress Notes (Signed)
History of Present Illness:  This is a 71 year old Caucasian male with multiple cardiovascular problems including several cardiac stents most recently in November of 2011. He had a negative flexible sigmoidoscopy 20 years ago by Dr. Marga Melnick. He currently denies any gastrointestinal problems except for chronic acid reflux managed with when necessary Prilosec. He denies dysphagia, melena, hematochezia, or any hepatobiliary symptomatology. His appetite is good and his weight is stable. 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 He follows a regular diet without food intolerances. He has a sister that lives in [place] and has had colon cancer. He relates he did Hemoccult cards last year that were guaiac-negative. There is no history of arm deficiency anemia. He bruises easily and is all Effient antiplatelet therapy. Recent evaluation showed a 16 cm right renal cyst, and his urologist would like to coordinate his colonoscopy with cyst aspiration percutaneously.  I have reviewed this patient's present history, medical and surgical past history, allergies and medications.     ROS: The remainder of the 10 point ROS is negative     Physical Exam: Numerous ecchymoses present on his forearms. He is a healthy-appearing patient in no distress blood pressure 110/62 and pulse 72 and regular. BMI 31.68. General well developed well nourished patient in no acute distress, appearing their stated age Eyes PERRLA, no icterus, fundoscopic exam per opthamologist Skin no lesions noted Neck supple, no adenopathy, no thyroid enlargement, no tenderness Chest clear to percussion and auscultation Heart no significant murmurs, gallops or rubs noted Abdomen no hepatosplenomegaly masses or tenderness, BS normal. . Extremities no acute joint lesions, edema, phlebitis or evidence of cellulitis. Neurologic patient oriented x 3, cranial nerves intact, no focal neurologic deficits noted. Psychological  mental status normal and normal affect.  Assessment and plan: This patient is high risk for colon cancer with his positive family history of colon cancer in a first-degree relative. He has no GI symptomatology, but has not had previous full colonoscopy. I have scheduled his colonoscopy with him all of Effient therapy for 5 days approved by Dr. Shirlee Latch, I have asked the patient to notify his urologist of his colonoscopy date so that he can coordinate his cyst aspiration while he is off antiplatelet therapy. Have otherwise advised him to continue all medications as listed and reviewed per primary care and cardiology. Her  No diagnosis found.

## 2012-03-14 ENCOUNTER — Encounter: Payer: Self-pay | Admitting: Gastroenterology

## 2012-03-14 ENCOUNTER — Ambulatory Visit (AMBULATORY_SURGERY_CENTER): Payer: 59 | Admitting: Gastroenterology

## 2012-03-14 VITALS — BP 133/74 | HR 66 | Temp 97.7°F | Resp 27 | Ht 70.0 in | Wt 220.0 lb

## 2012-03-14 DIAGNOSIS — D126 Benign neoplasm of colon, unspecified: Secondary | ICD-10-CM

## 2012-03-14 DIAGNOSIS — Z86010 Personal history of colonic polyps: Secondary | ICD-10-CM

## 2012-03-14 DIAGNOSIS — Z800 Family history of malignant neoplasm of digestive organs: Secondary | ICD-10-CM

## 2012-03-14 MED ORDER — SODIUM CHLORIDE 0.9 % IV SOLN: 500.0000 mL | INTRAVENOUS | Status: DC

## 2012-03-14 NOTE — Patient Instructions (Addendum)
YOU HAD AN ENDOSCOPIC PROCEDURE TODAY AT THE [facility]: Refer to the procedure report that was given to you for any specific questions about what was found during the examination.  If the procedure report does not answer your questions, please call your gastroenterologist to clarify.  If you requested that your care partner not be given the details of your procedure findings, then the procedure report has been included in a sealed envelope for you to review at your convenience later.  YOU SHOULD EXPECT: Some feelings of bloating in the abdomen. Passage of more gas than usual.  Walking can help get rid of the air that was put into your GI tract during the procedure and reduce the bloating. If you had a lower endoscopy (such as a colonoscopy or flexible sigmoidoscopy) you may notice spotting of blood in your stool or on the toilet paper. If you underwent a bowel prep for your procedure, then you may not have a normal bowel movement for a few days.  DIET: Your first meal following the procedure should be a light meal and then it is ok to progress to your normal diet.  A half-sandwich or bowl of soup is an example of a good first meal.  Heavy or fried foods are harder to digest and may make you feel nauseous or bloated.  Likewise meals heavy in dairy and vegetables can cause extra gas to form and this can also increase the bloating.  Drink plenty of fluids but you should avoid alcoholic beverages for 24 hours.  ACTIVITY: Your care partner should take you home directly after the procedure.  You should plan to take it easy, moving slowly for the rest of the day.  You can resume normal activity the day after the procedure however you should NOT DRIVE or use heavy machinery for 24 hours (because of the sedation medicines used during the test).    SYMPTOMS TO REPORT IMMEDIATELY: A gastroenterologist can be reached at any hour.  During normal business hours, 8:30 AM to 5:00 PM Monday through Friday,  call [phone].  After hours and on weekends, please call the GI answering service at [phone] who will take a message and have the physician on call contact you.   Following lower endoscopy (colonoscopy or flexible sigmoidoscopy):  Excessive amounts of blood in the stool  Significant tenderness or worsening of abdominal pains  Swelling of the abdomen that is new, acute  Fever of 100F or higher    FOLLOW UP: If any biopsies were taken you will be contacted by phone or by letter within the next 1-3 weeks.  Call your gastroenterologist if you have not heard about the biopsies in 3 weeks.  Our staff will call the home number listed on your records the next business day following your procedure to check on you and address any questions or concerns that you may have at that time regarding the information given to you following your procedure. This is a courtesy call and so if there is no answer at the home number and we have not heard from you through the emergency physician on call, we will assume that you have returned to your regular daily activities without incident.  SIGNATURES/CONFIDENTIALITY: You and/or your care partner have signed paperwork which will be entered into your electronic medical record.  These signatures attest to the fact that that the information above on your After Visit Summary has been reviewed and is understood.  Full responsibility of the confidentiality   of this discharge information lies with you and/or your care-partner.    INFORMATION ON DIVERTICULOSIS ,POLYPS, &HIGH FIBER DIET GIVEN TO YOU TODAY  RESTART ALL MEDICATIONS INCLUDING EFFIENT

## 2012-03-14 NOTE — Progress Notes (Signed)
The pt tolerated the colonoscopy very well. Maw   

## 2012-03-14 NOTE — Progress Notes (Signed)
Per the pt his last dose of effient was last Thursday.  He has been off it for 5 days.  Dr. Jarold Motto was made aware of this preprocedure. Maw

## 2012-03-14 NOTE — Op Note (Signed)
[facility] [address] [place], [place]  [address]  COLONOSCOPY PROCEDURE REPORT  PATIENT:  Peter Blair, Peter Blair  MR#:  [id number] BIRTHDATE:  Aug 02, 1941, 70 yrs. old  GENDER:  male ENDOSCOPIST:  Vania Rea. Jarold Motto, MD, [facility] REF. BY: PROCEDURE DATE:  03/14/2012 PROCEDURE:  Colonoscopy with snare polypectomy ASA CLASS:  Class III INDICATIONS:  history of pre-cancerous (adenomatous) colon polyps, family history of colon cancer SISTER MEDICATIONS:   propofol (Diprivan) 150  DESCRIPTION OF PROCEDURE:   After the risks and benefits and of the procedure were explained, informed consent was obtained. Digital rectal exam was performed and revealed no abnormalities. The LB CF-[id number] endoscope was introduced through the anus and advanced to the cecum, which was identified by both the appendix and ileocecal valve.  The quality of the prep was excellent, using MoviPrep.  The instrument was then slowly withdrawn as the colon was fully examined. <<PROCEDUREIMAGES>>  FINDINGS:  There were mild diverticular changes in left colon. diverticulosis was found in the sigmoid to descending colon segments.  A sessile polyp was found in the descending colon. 4 MM FLAT POLYP COLD SNARE REMOVED   Retroflexed views in the rectum revealed no abnormalities.    The scope was then withdrawn from the patient and the procedure completed.  COMPLICATIONS:  None ENDOSCOPIC IMPRESSION: 1) Diverticulosis,mild,left sided diverticulosis in the sigmoid to descending colon segments 2) Sessile polyp in the descending colon R/O ADENOMA. RECOMMENDATIONS: 1) Await pathology results 2) Continue current medications  REPEAT EXAM:  No  ______________________________ Vania Rea. Jarold Motto, MD, [facility]  CC:  Pecola Lawless, MDDalton Alford Highland, MD  n. Rosalie Doctor:   Vania Rea. Joydan Gretzinger at 03/14/2012 03:24 PM  Sammie Bench, [phone]

## 2012-03-14 NOTE — Progress Notes (Signed)
Patient did not experience any of the following events: a burn prior to discharge; a fall within the facility; wrong site/side/patient/procedure/implant event; or a hospital transfer or hospital admission upon discharge from the facility. (G8907) Patient did not have preoperative order for IV antibiotic SSI prophylaxis. (G8918)  

## 2012-03-15 ENCOUNTER — Telehealth: Payer: Self-pay

## 2012-03-15 NOTE — Telephone Encounter (Signed)
  Follow up Call-  Call back number 03/14/2012  Post procedure Call Back phone  # [phone]  Permission to leave phone message Yes     Patient questions:  Do you have a fever, pain , or abdominal swelling? no Pain Score  0 *  Have you tolerated food without any problems? yes  Have you been able to return to your normal activities? yes  Do you have any questions about your discharge instructions: Diet   no Medications  no Follow up visit  no  Do you have questions or concerns about your Care? no  Actions: * If pain score is 4 or above: No action needed, pain <4.  Per the pt "everything went fine , no problems". Maw

## 2012-03-23 ENCOUNTER — Encounter: Payer: Self-pay | Admitting: Gastroenterology

## 2012-04-23 ENCOUNTER — Ambulatory Visit (INDEPENDENT_AMBULATORY_CARE_PROVIDER_SITE_OTHER): Payer: 59 | Admitting: Cardiology

## 2012-04-23 ENCOUNTER — Encounter: Payer: Self-pay | Admitting: Cardiology

## 2012-04-23 VITALS — BP 135/77 | HR 61 | Ht 70.0 in | Wt 220.1 lb

## 2012-04-23 DIAGNOSIS — I739 Peripheral vascular disease, unspecified: Secondary | ICD-10-CM

## 2012-04-23 DIAGNOSIS — E785 Hyperlipidemia, unspecified: Secondary | ICD-10-CM

## 2012-04-23 DIAGNOSIS — I2510 Atherosclerotic heart disease of native coronary artery without angina pectoris: Secondary | ICD-10-CM

## 2012-04-23 MED ORDER — ROSUVASTATIN CALCIUM 10 MG PO TABS: 10.0000 mg | ORAL_TABLET | Freq: Every day | ORAL | Status: DC

## 2012-04-23 NOTE — Patient Instructions (Signed)
Your physician wants you to follow-up in: 6 months with Dr. Shirlee Latch.  You will receive a reminder letter in the mail two months in advance. If you  don't receive a letter, please call our office to schedule the follow-up appointment.  Your physician recommends that you return for lab work on 04/25/2012.  Lipids and Liver

## 2012-04-24 NOTE — Assessment & Plan Note (Signed)
DES to proximal RCA in-stent restenois in 10/11.  He has some fatigue but no significant exertional dyspnea or chest pain. No bleeding complications so I will have him continue Effient until 10/13 (2 years).  I will also continue metoprolol, quinapril, Crestor.  He is > 1 year out from his last stent so can hold Effient for his IR procedure to drain his renal cyst.

## 2012-04-24 NOTE — Assessment & Plan Note (Signed)
Check lipids/LFTs, goal LDL < 70.  

## 2012-04-24 NOTE — Progress Notes (Signed)
PCP: Dr. Alwyn Ren  71 yo with CAD with severe in-stent restenosis in the proximal RCA in 10/11 with placement of DES to the proximal RCA returns for followup.  He walks about 1.5 miles once a week.  He feels fatigued by the time he finishes the walk but denies chest pain or dyspnea.  Main issue recently has been abdominal pain, predominantly in the right flank.  He has a large right kidney cyst with plans for drainage by interventional radiology.  He continues on Crestor and coenzyme Q10 with minimal myalgias.    Labs (10/12): LDL 46, HDL 44, K 4.3, creatinine 1.2  Allergies (verified):  No Known Drug Allergies   Past Medical History:  1. Hypertension  2. Nephrolithiasis.  3. Hyperlipidemia: Myalgias with Lipitor.  4. Skin cancer, hx of, cell type unknown , Dr Margo Aye  5. Right shoulder pain, rotator cuff  6. CAD: Exertional chest pain, cath 12/10 showed EF 50-55% with mild inferior hypokinesis, 99% proximal and 60% distal RCA stenosis, 80% proximal stenosis of a moderate-sized D1, 40% proximal LAD. Patient had BMS x 2 placed in the RCA. Unstable angina 10/11 with LHC showing 99% instent restenosis in proximal RCA. Patient had PROMUS DES to the proximal RCA. EF 60% on LV-gram.  7. Allergic rhinitis 8. ABIs normal in 8/12.  9. Large renal cyst on right.   Family History:  Father:? DM, MI at 53, smoker  Mother: breast CA, Gyn CA  Siblings: sister breast CA   Social History:  Retired, Theatre manager  Married  Alcohol use-yes:minimally (6 beers /year)  Regular exercise-no  Former Smoker; quit 1982    Current Outpatient Prescriptions  Medication Sig Dispense Refill  . amLODipine (NORVASC) 10 MG tablet Take 1 tablet (10 mg total) by mouth daily.  90 tablet  3  . aspirin 81 MG tablet Take 81 mg by mouth daily.        . Coenzyme Q10 200 MG TABS Take by mouth.    0  . metoprolol tartrate (LOPRESSOR) 25 MG tablet Take 1 tablet (25 mg total) by mouth 2 (two) times daily.  180 tablet  3  .  nitroGLYCERIN (NITROSTAT) 0.4 MG SL tablet Place 0.4 mg under the tongue every 5 (five) minutes as needed.        Marland Kitchen omeprazole (PRILOSEC) 20 MG capsule Take 20 mg by mouth daily.        . prasugrel (EFFIENT) 10 MG TABS Take 1 tablet (10 mg total) by mouth daily.  90 tablet  3  . quinapril (ACCUPRIL) 40 MG tablet Take 1 tablet (40 mg total) by mouth daily.  90 tablet  3  . rosuvastatin (CRESTOR) 10 MG tablet Take 1 tablet (10 mg total) by mouth at bedtime.  90 tablet  3  . sildenafil (VIAGRA) 100 MG tablet Take 1 tablet (100 mg total) by mouth daily as needed. DO NOT TAKE WITH NTG  18 tablet  1    BP 135/77  Pulse 61  Ht 5\' 10"  (1.778 m)  Wt 220 lb 1.9 oz (99.846 kg)  BMI 31.58 kg/m2 General: NAD, obese.  Neck: No JVD, no thyromegaly or thyroid nodule.  Lungs: Clear to auscultation bilaterally with normal respiratory effort. CV: Nondisplaced PMI.  Heart regular S1/S2, no S3/S4, no murmur.  Trace ankle edema.  No carotid bruit.  Abdomen: Soft, nontender, no hepatosplenomegaly, no distention.   Neurologic: Alert and oriented x 3.  Psych: Normal affect. Extremities: No clubbing or cyanosis.

## 2012-04-25 ENCOUNTER — Other Ambulatory Visit (INDEPENDENT_AMBULATORY_CARE_PROVIDER_SITE_OTHER): Payer: 59

## 2012-04-25 DIAGNOSIS — E785 Hyperlipidemia, unspecified: Secondary | ICD-10-CM

## 2012-04-25 LAB — LIPID PANEL
Cholesterol: 113 mg/dL (ref 0–200)
HDL: 41.4 mg/dL (ref >39.00)
LDL Cholesterol: 50 mg/dL (ref 0–99)
Triglycerides: 106 mg/dL (ref 0.0–149.0)
VLDL: 21.2 mg/dL (ref 0.0–40.0)

## 2012-04-25 LAB — HEPATIC FUNCTION PANEL
AST: 20 U/L (ref 0–37)
Total Bilirubin: 0.7 mg/dL (ref 0.3–1.2)

## 2012-04-27 ENCOUNTER — Telehealth: Payer: Self-pay | Admitting: *Deleted

## 2012-04-27 NOTE — Telephone Encounter (Signed)
pt notified of lab results today 

## 2012-04-27 NOTE — Telephone Encounter (Signed)
Message copied by Tarri Fuller on Fri Apr 27, 2012  4:07 PM ------      Message from: Laurey Morale      Created: Fri Apr 27, 2012  8:47 AM       Good lipids

## 2012-05-30 ENCOUNTER — Telehealth: Payer: Self-pay | Admitting: Cardiology

## 2012-05-30 NOTE — Telephone Encounter (Signed)
Dr Alford Highland 04/23/12 note states OK to hold Effient for IR procedure for renal cyst. HIM will fax this note to Juanita in IR at [facility] .

## 2012-05-30 NOTE — Telephone Encounter (Signed)
New problem:  Per [place] calling from [place] . Notes for Dr. Shirlee Latch for cardiac clearance  To come off blood thinner for upcoming procedure on   July 10. Need to stop on  7/4.

## 2012-05-30 NOTE — Telephone Encounter (Signed)
Spoke with Dorann Lodge from [facility]. Pt scheduled for drainage of kidney cyst 06/13/12. He needs to hold blood thinners starting 06/07/12.

## 2012-06-11 ENCOUNTER — Ambulatory Visit: Payer: Self-pay | Admitting: Urology

## 2012-06-11 LAB — CBC WITH DIFFERENTIAL/PLATELET
Basophil #: 0 10*3/uL (ref 0.0–0.1)
Basophil %: 0.5 %
HCT: 45.4 % (ref 40.0–52.0)
HGB: 15.2 g/dL (ref 13.0–18.0)
Lymphocyte %: 22.9 %
MCV: 93 fL (ref 80–100)
Monocyte %: 7.5 %
Neutrophil #: 3.6 10*3/uL (ref 1.4–6.5)
Neutrophil %: 67.2 %
RBC: 4.88 10*6/uL (ref 4.40–5.90)
RDW: 13.1 % (ref 11.5–14.5)
WBC: 5.4 10*3/uL (ref 3.8–10.6)

## 2012-06-11 LAB — PROTIME-INR
INR: 0.9
Prothrombin Time: 12.5 secs (ref 11.5–14.7)

## 2012-06-11 LAB — APTT: Activated PTT: 27.8 secs (ref 23.6–35.9)

## 2012-06-13 ENCOUNTER — Ambulatory Visit: Payer: Self-pay | Admitting: Urology

## 2012-10-03 ENCOUNTER — Other Ambulatory Visit: Payer: Self-pay | Admitting: Cardiology

## 2012-10-29 ENCOUNTER — Ambulatory Visit (INDEPENDENT_AMBULATORY_CARE_PROVIDER_SITE_OTHER): Payer: 59 | Admitting: Cardiology

## 2012-10-29 ENCOUNTER — Encounter: Payer: Self-pay | Admitting: Cardiology

## 2012-10-29 VITALS — BP 164/86 | HR 70 | Ht 70.0 in | Wt 221.0 lb

## 2012-10-29 DIAGNOSIS — R079 Chest pain, unspecified: Secondary | ICD-10-CM

## 2012-10-29 DIAGNOSIS — I2510 Atherosclerotic heart disease of native coronary artery without angina pectoris: Secondary | ICD-10-CM

## 2012-10-29 DIAGNOSIS — E785 Hyperlipidemia, unspecified: Secondary | ICD-10-CM

## 2012-10-29 DIAGNOSIS — I10 Essential (primary) hypertension: Secondary | ICD-10-CM

## 2012-10-29 NOTE — Patient Instructions (Addendum)
Your physician recommends that you schedule a follow-up appointment in: 2 WEEKS WITH DR [facility]  Your physician has requested that you have en exercise stress myoview. For further information please visit [email or web address] Please follow instruction sheet, as given.   Your physician recommends that you HAVE LAB WORK TODAY

## 2012-10-29 NOTE — Progress Notes (Signed)
Patient ID: Peter Blair, male   DOB: [birth year], 71 y.o.   MRN: [id number] PCP: Dr. Alwyn Ren  71 yo with CAD with severe in-stent restenosis in the proximal RCA in 10/11 with placement of DES to the proximal RCA returns for followup.  He had been doing well up until Friday.  On Friday, he noted mild chest burning while on his daily walk with his wife . He got home and did a lot of yardwork later that day with no further chest symptoms.  The next morning, he noted mild burning in his left chest.  This waxed and waned most of the day.  It actually would get better with exertion.  He again noted the on and off chest burning most of the day on Sunday as well.  He did not have chest burning when he woke up this morning.  Symptoms are different than prior ischemic symptoms.  At baseline, he walks 1.5 miles with his wife several times a week with no significant dyspnea. He continues on Crestor and coenzyme Q10 with minimal myalgias.  His BP has been running high since Saturday.  Had been within normal range before that.   ECG: NSR, TWI in III (no change from prior)   Labs (10/12): LDL 46, HDL 44, K 4.3, creatinine 1.2 Labs (5/13): LDL 50, HDL 41  Allergies (verified):  No Known Drug Allergies   Past Medical History:  1. Hypertension  2. Nephrolithiasis.  3. Hyperlipidemia: Myalgias with Lipitor.  4. Skin cancer, hx of, cell type unknown , Dr Margo Aye  5. Right shoulder pain, rotator cuff  6. CAD: Exertional chest pain, cath 12/10 showed EF 50-55% with mild inferior hypokinesis, 99% proximal and 60% distal RCA stenosis, 80% proximal stenosis of a moderate-sized D1, 40% proximal LAD. Patient had BMS x 2 placed in the RCA. Unstable angina 10/11 with LHC showing 99% instent restenosis in proximal RCA. Patient had PROMUS DES to the proximal RCA. EF 60% on LV-gram.  7. Allergic rhinitis 8. ABIs normal in 8/12.  9. Large renal cyst on right.   Family History:  Father:? DM, MI at 21, smoker  Mother: breast CA,  Gyn CA  Siblings: sister breast CA   Social History:  Retired, Theatre manager  Married  Alcohol use-yes:minimally (6 beers /year)  Regular exercise-no  Former Smoker; quit 1982   ROS: All systems reviewed and negative except as per HPI.   Current Outpatient Prescriptions  Medication Sig Dispense Refill  . amLODipine (NORVASC) 10 MG tablet Take 1 tablet (10 mg total) by mouth daily.  90 tablet  3  . aspirin 81 MG tablet Take 81 mg by mouth daily.        . Coenzyme Q10 200 MG TABS Take 1 tablet by mouth daily.     0  . metoprolol tartrate (LOPRESSOR) 25 MG tablet Take 1 tablet (25 mg total) by mouth 2 (two) times daily.  180 tablet  3  . nitroGLYCERIN (NITROSTAT) 0.4 MG SL tablet Place 0.4 mg under the tongue every 5 (five) minutes as needed.        Marland Kitchen omeprazole (PRILOSEC) 20 MG capsule Take 20 mg by mouth daily.        . prasugrel (EFFIENT) 10 MG TABS Take 1 tablet (10 mg total) by mouth daily.  30 tablet  0  . quinapril (ACCUPRIL) 40 MG tablet Take 1 tablet (40 mg total) by mouth daily.  90 tablet  3  . rosuvastatin (CRESTOR) 10 MG tablet  Take 1 tablet (10 mg total) by mouth at bedtime.  90 tablet  3  . sildenafil (VIAGRA) 100 MG tablet Take 1 tablet (100 mg total) by mouth daily as needed. DO NOT TAKE WITH NTG  18 tablet  1    BP 164/86  Pulse 70  Ht 5\' 10"  (1.778 m)  Wt 221 lb (100.245 kg)  BMI 31.71 kg/m2 General: NAD, obese.  Neck: No JVD, no thyromegaly or thyroid nodule.  Lungs: Clear to auscultation bilaterally with normal respiratory effort. CV: Nondisplaced PMI.  Heart regular S1/S2, no S3/S4, no murmur.  Trace ankle edema.  No carotid bruit.  Abdomen: Soft, nontender, no hepatosplenomegaly, no distention.   Neurologic: Alert and oriented x 3.  Psych: Normal affect. Extremities: No clubbing or cyanosis.   Assessment/Plan:  CAD, NATIVE VESSEL  DES to proximal RCA in-stent restenois in 10/11. On Friday, he had a mild episode of chest burning with walking.  After  heavy yardwork later Friday, he had mild left chest tightness on and off on Saturday and Sunday, possibly better with exertion.  Feels better today. No ECG changes.  Somewhat different than prior ischemic symptoms.  He is worried that something is wrong.   - Continue ASA, Effient, metoprolol, Crestor.   - Troponin to be drawn stat.   - If troponin negative, will arrange ETT-myoview tomorrow.  HYPERLIPIDEMIA Good lipids in 5/13 on current dose of Crestor.  He has had issues with statin-related myalgias so would avoid increasing statin.  Hypertension BP high today and over weekend.  Had been running normal range prior to this.  Suspect this may be a stress response to his symptoms.  Will reassess at followup.   Marca Ancona 10/29/2012 12:50 PM

## 2012-10-30 ENCOUNTER — Ambulatory Visit (HOSPITAL_COMMUNITY): Payer: Medicare Other | Attending: Cardiology | Admitting: Radiology

## 2012-10-30 VITALS — BP 137/79 | HR 70 | Ht 70.0 in | Wt 219.0 lb

## 2012-10-30 DIAGNOSIS — I2510 Atherosclerotic heart disease of native coronary artery without angina pectoris: Secondary | ICD-10-CM

## 2012-10-30 DIAGNOSIS — R079 Chest pain, unspecified: Secondary | ICD-10-CM

## 2012-10-30 MED ORDER — TECHNETIUM TC 99M SESTAMIBI GENERIC - CARDIOLITE
9.1000 | Freq: Once | INTRAVENOUS | Status: AC | PRN
  Administered 2012-10-30: 9 via INTRAVENOUS

## 2012-10-30 MED ORDER — TECHNETIUM TC 99M SESTAMIBI GENERIC - CARDIOLITE
32.1000 | Freq: Once | INTRAVENOUS | Status: AC | PRN
  Administered 2012-10-30: 32.1 via INTRAVENOUS

## 2012-10-30 NOTE — Progress Notes (Signed)
[facility] SITE 3 NUCLEAR MED [address] [id number] [place] [address] [phone]  Cardiology Nuclear Med Study  Peter Blair is a 71 y.o. male     MRN : [id number]     DOB: 04/24/1941  Procedure Date: 10/30/2012  Nuclear Med Background Indication for Stress Test:  Evaluation for Ischemia and Stent Patency History:  &#39;11 MPS Rest Only:Stress attempted, developed ST elevation>Stent-RCA with residual dz, EF=60% Cardiac Risk Factors: Claudication, Family History - CAD, History of Smoking, Hypertension, Lipids and Obesity  Symptoms:  Chest Pain with and without Exertion (last episode of chest discomfort was yesterday)   Nuclear Pre-Procedure Caffeine/Decaff Intake:  7:00pm NPO After: 7:00pm   Lungs:  Clear. O2 Sat: 94% on room air. IV 0.9% NS with Angio Cath:  22g  IV Site: R Hand  IV Started by:  Cathlyn Parsons, RN  Chest Size (in):  46 Cup Size: n/a  Height: 5\' 10"  (1.778 m)  Weight:  219 lb (99.338 kg)  BMI:  Body mass index is 31.42 kg/(m^2). Tech Comments:  Metoprolol held x 13 hours    Nuclear Med Study 1 or 2 day study: 1 day  Stress Test Type:  Stress  Reading MD: Marca Ancona, MD  Order Authorizing Provider:  Marca Ancona, MD  Resting Radionuclide: Technetium 53m Sestamibi  Resting Radionuclide Dose: 9.1 mCi   Stress Radionuclide:  Technetium 82m Sestamibi  Stress Radionuclide Dose: 32.1 mCi           Stress Protocol Rest HR: 70 Stress HR: 131  Rest BP: 137/79 Stress BP: 219/77  Exercise Time (min): 7:01 METS: 7.0   Predicted Max HR: 150 bpm % Max HR: 87.33 bpm Rate Pressure Product: 62952   Dose of Adenosine (mg):  n/a Dose of Lexiscan: n/a mg  Dose of Atropine (mg): n/a Dose of Dobutamine: n/a mcg/kg/min (at max HR)  Stress Test Technologist: Smiley Houseman, CMA-N  Nuclear Technologist:  Domenic Polite, CNMT     Rest Procedure:  Myocardial perfusion imaging was performed at rest 45 minutes following the intravenous  administration of Technetium 26m Sestamibi.  Rest ECG: Prior IWMI.  Stress Procedure:  The patient exercised on the treadmill utilizing the Bruce protocol for 7:01 minutes. He then stopped due to fatigue and bilateral leg cramps, R=L.  He denied any chest pain.  There were no diagnostic ST-T wave changes.  He had a hypertensive response to exercise.  Technetium 41m Sestamibi was injected at peak exercise and myocardial perfusion imaging was performed after a brief delay.  Stress ECG: No significant change from baseline ECG  QPS Raw Data Images:  Normal; no motion artifact; normal heart/lung ratio. Stress Images:  Normal homogeneous uptake in all areas of the myocardium. Rest Images:  Normal homogeneous uptake in all areas of the myocardium. Subtraction (SDS):  There is no evidence of scar or ischemia. Transient Ischemic Dilatation (Normal <1.22):  0.94 Lung/Heart Ratio (Normal <0.45):  0.35  Quantitative Gated Spect Images QGS EDV:  95 ml QGS ESV:  30 ml  Impression Exercise Capacity:  Fair exercise capacity. BP Response:  Hypertensive blood pressure response. Clinical Symptoms:  Fatigue, no chest pain.  ECG Impression:  No significant ST segment change suggestive of ischemia. Comparison with Prior Nuclear Study: No images to compare  Overall Impression:  Normal stress nuclear study.  LV Ejection Fraction: 68%.  LV Wall Motion:  NL LV Function; NL Wall Motion  Marca Ancona 10/30/2012

## 2012-11-12 ENCOUNTER — Ambulatory Visit (INDEPENDENT_AMBULATORY_CARE_PROVIDER_SITE_OTHER): Payer: 59 | Admitting: Cardiology

## 2012-11-12 ENCOUNTER — Encounter: Payer: Self-pay | Admitting: Cardiology

## 2012-11-12 VITALS — BP 152/78 | HR 71 | Ht 70.0 in | Wt 224.0 lb

## 2012-11-12 DIAGNOSIS — I2510 Atherosclerotic heart disease of native coronary artery without angina pectoris: Secondary | ICD-10-CM

## 2012-11-12 DIAGNOSIS — I10 Essential (primary) hypertension: Secondary | ICD-10-CM

## 2012-11-12 DIAGNOSIS — E785 Hyperlipidemia, unspecified: Secondary | ICD-10-CM

## 2012-11-12 MED ORDER — QUINAPRIL HCL 40 MG PO TABS: 40.0000 mg | ORAL_TABLET | Freq: Every day | ORAL | Status: DC

## 2012-11-12 NOTE — Patient Instructions (Addendum)
Stop effient.  Your physician wants you to follow-up in: 6 months with Dr Shirlee Latch. (June 2014). You will receive a reminder letter in the mail two months in advance. If you don't receive a letter, please call our office to schedule the follow-up appointment.

## 2012-11-12 NOTE — Progress Notes (Signed)
Patient ID: Peter Blair, male   DOB: 17-Dec-1940, 71 y.o.   MRN: [id number] PCP: Dr. Alwyn Ren  71 yo with CAD with severe in-stent restenosis in the proximal RCA in 10/11 with placement of DES to the proximal RCA returns for followup.  He had been doing well up until Friday about a week ago.  On that Friday, he noted mild chest burning while on his daily walk with his wife . He got home and did a lot of yardwork later that day with no further chest symptoms.  The next morning, he noted mild burning in his left chest.  This waxed and waned most of the day.  It actually would get better with exertion.  He again noted the on and off chest burning most of the day on Sunday as well.  Symptoms were different than prior ischemic symptoms.  At baseline, he walks 1.5 miles with his wife several times a week with no significant dyspnea. He continues on Crestor and coenzyme Q10 with minimal myalgias.    I had him do an ETT-myoview last week.  He exercised for 7 minutes with no ECG changes and normal perfusion images.  This was a normal study.  He has had no further chest symptoms.  Since he heard that his stress test was normal, his BP, which had been running high, has decreased to the 120s-130s range.    Labs (10/12): LDL 46, HDL 44, K 4.3, creatinine 1.2 Labs (5/13): LDL 50, HDL 41  Allergies (verified):  No Known Drug Allergies   Past Medical History:  1. Hypertension  2. Nephrolithiasis.  3. Hyperlipidemia: Myalgias with Lipitor.  4. Skin cancer, hx of, cell type unknown , Dr Margo Aye  5. Right shoulder pain, rotator cuff  6. CAD: Exertional chest pain, cath 12/10 showed EF 50-55% with mild inferior hypokinesis, 99% proximal and 60% distal RCA stenosis, 80% proximal stenosis of a moderate-sized D1, 40% proximal LAD. Patient had BMS x 2 placed in the RCA. Unstable angina 10/11 with LHC showing 99% instent restenosis in proximal RCA. Patient had PROMUS DES to the proximal RCA. EF 60% on LV-gram. ETT-Myoview  (11/13) with 7'1" exercise, EF 69%, no evidence for ischemia or infarction.  7. Allergic rhinitis 8. ABIs normal in 8/12.  9. Large renal cyst on right.   Family History:  Father:? DM, MI at 12, smoker  Mother: breast CA, Gyn CA  Siblings: sister breast CA   Social History:  Retired, Theatre manager  Married  Alcohol use-yes:minimally (6 beers /year)  Regular exercise-no  Former Smoker; quit 1982   Current Outpatient Prescriptions  Medication Sig Dispense Refill  . amLODipine (NORVASC) 10 MG tablet Take 1 tablet (10 mg total) by mouth daily.  90 tablet  3  . aspirin 81 MG tablet Take 81 mg by mouth daily.        . Coenzyme Q10 200 MG TABS Take 1 tablet by mouth daily.     0  . metoprolol tartrate (LOPRESSOR) 25 MG tablet Take 1 tablet (25 mg total) by mouth 2 (two) times daily.  180 tablet  3  . nitroGLYCERIN (NITROSTAT) 0.4 MG SL tablet Place 0.4 mg under the tongue every 5 (five) minutes as needed.        Marland Kitchen omeprazole (PRILOSEC) 20 MG capsule Take 20 mg by mouth daily.        . prasugrel (EFFIENT) 10 MG TABS Take 1 tablet (10 mg total) by mouth daily.  30 tablet  0  .  quinapril (ACCUPRIL) 40 MG tablet Take 1 tablet (40 mg total) by mouth daily.  90 tablet  3  . rosuvastatin (CRESTOR) 10 MG tablet Take 1 tablet (10 mg total) by mouth at bedtime.  90 tablet  3  . sildenafil (VIAGRA) 100 MG tablet Take 1 tablet (100 mg total) by mouth daily as needed. DO NOT TAKE WITH NTG  18 tablet  1    BP 152/78  Pulse 71  Ht 5\' 10"  (1.778 m)  Wt 224 lb (101.606 kg)  BMI 32.14 kg/m2  SpO2 98% General: NAD, obese.  Neck: No JVD, no thyromegaly or thyroid nodule.  Lungs: Clear to auscultation bilaterally with normal respiratory effort. CV: Nondisplaced PMI.  Heart regular S1/S2, no S3/S4, no murmur.  Trace ankle edema.  No carotid bruit.  Abdomen: Soft, nontender, no hepatosplenomegaly, no distention.   Neurologic: Alert and oriented x 3.  Psych: Normal affect. Extremities: No clubbing  or cyanosis.   Assessment/Plan:  CAD, NATIVE VESSEL  DES to proximal RCA in-stent restenois in 10/11.   A week ago, he had several episodes of chest pain. ETT-Myoview last week showed no evidence for ischemia or infarction and he has had no further symptoms.  I suspect pain was noncardiac.  - He can stop Effient but should continue ASA 81.  - Continue metoprolol, ACEI, Crestor.  HYPERLIPIDEMIA Good lipids in 5/13 on current dose of Crestor.  He has had issues with statin-related myalgias so would avoid increasing statin.  Hypertension BP has been much improved since he heart that Myoview was normal.  Continue current BP regimen.   Marca Ancona 11/12/2012 3:38 PM

## 2012-12-13 ENCOUNTER — Ambulatory Visit (INDEPENDENT_AMBULATORY_CARE_PROVIDER_SITE_OTHER): Payer: Medicare Other | Admitting: Internal Medicine

## 2012-12-13 ENCOUNTER — Encounter: Payer: Self-pay | Admitting: Internal Medicine

## 2012-12-13 VITALS — BP 146/82 | HR 68 | Temp 98.2°F | Resp 12 | Ht 70.08 in | Wt 223.0 lb

## 2012-12-13 DIAGNOSIS — Z23 Encounter for immunization: Secondary | ICD-10-CM

## 2012-12-13 DIAGNOSIS — I2510 Atherosclerotic heart disease of native coronary artery without angina pectoris: Secondary | ICD-10-CM

## 2012-12-13 DIAGNOSIS — E785 Hyperlipidemia, unspecified: Secondary | ICD-10-CM

## 2012-12-13 DIAGNOSIS — I10 Essential (primary) hypertension: Secondary | ICD-10-CM

## 2012-12-13 DIAGNOSIS — R7309 Other abnormal glucose: Secondary | ICD-10-CM

## 2012-12-13 DIAGNOSIS — Z0000 Encounter for general adult medical examination without abnormal findings: Secondary | ICD-10-CM

## 2012-12-13 LAB — TSH: TSH: 1.73 u[IU]/mL (ref 0.35–5.50)

## 2012-12-13 LAB — BASIC METABOLIC PANEL
CO2: 28 mEq/L (ref 19–32)
Calcium: 9.5 mg/dL (ref 8.4–10.5)
Creatinine, Ser: 1.1 mg/dL (ref 0.4–1.5)

## 2012-12-13 MED ORDER — QUINAPRIL HCL 40 MG PO TABS: 40.0000 mg | ORAL_TABLET | Freq: Every day | ORAL | Status: DC

## 2012-12-13 MED ORDER — METOPROLOL TARTRATE 25 MG PO TABS: 25.0000 mg | ORAL_TABLET | Freq: Two times a day (BID) | ORAL | Status: DC

## 2012-12-13 MED ORDER — SILDENAFIL CITRATE 100 MG PO TABS: 100.0000 mg | ORAL_TABLET | Freq: Every day | ORAL | Status: DC | PRN

## 2012-12-13 MED ORDER — AMLODIPINE BESYLATE 10 MG PO TABS: 10.0000 mg | ORAL_TABLET | Freq: Every day | ORAL | Status: DC

## 2012-12-13 NOTE — Patient Instructions (Addendum)
Preventive Health Care: Exercise at least 30-45 minutes a day,  3-4 days a week.  Eat a low-fat diet with lots of fruits and vegetables, up to 7-9 servings per day. Consume less than 40 grams of sugar per day from foods & drinks with High Fructose Corn Sugar as #1,2,3 or # 4 on label. Blood Pressure Goal  Ideally is an AVERAGE < 135/85. This AVERAGE should be calculated from @ least 5-7 BP readings taken @ different times of day on different days of week. You should not respond to isolated BP readings , but rather the AVERAGE for that week. Review and correct the record as indicated. Please share record with all medical staff seen.  If you activate My Chart; the results can be released to you as soon as they populate from the lab. If you choose not to use this program; the labs have to be reviewed, copied & mailed   causing a delay in getting the results to you.

## 2012-12-13 NOTE — Progress Notes (Signed)
Subjective:    Patient ID: Peter Blair, male    DOB: 09/30/1941, 72 y.o.   MRN: [id number]  HPI Medicare Wellness Visit:  The following psychosocial & medical history were reviewed as required by Medicare.   Social history: caffeine: 2-3 diet sodas or tea daily , alcohol:  rarely ,  tobacco use : quit 1972  & exercise : not much.   Home & personal  safety / fall risk: no issues, activities of daily living: no limitations , seatbelt use : yes , and smoke alarm employment : yes .  Power of Attorney/Living Will status : in place  Vision ( as recorded per Nurse) & Hearing  evaluation : Ophth exam overdue; no hearing exam. Orientation :oriented X 3 , memory & recall :good, spelling or math testing: good,and mood & affect : normal . Depression / anxiety:denied [place] :[place]  , immunization status :? Shingles neded , transfusion history: no, and preventive health surveillance ( colonoscopies, BMD , etc as per protocol/ [facility]): colonoscopy 2013, Dental care:  Every 4 mos . Chart reviewed &  Updated. Active issues reviewed & addressed.       Review of Systems HYPERTENSION: Disease Monitoring: Blood pressure average-133.65/71.7 Chest pain, palpitations- evaluated in 11/13 by Dr Shirlee Latch; neg labs & stress test       Dyspnea- no Medications: Compliance- yes  Lightheadedness,Syncope- no    Edema- no   FASTING HYPERGLYCEMIA, PMH of:  Disease Monitoring: Blood Sugar ranges-no monitor  Polyuria/phagia/dipsia-no       Visual problems- no   HYPERLIPIDEMIA: Disease Monitoring: See symptoms for Hypertension Medications: Compliance-yes  Abd pain, bowel changes- no   Muscle aches- some, ? From deconditioning         Objective:   Physical Exam Gen.:  well-nourished in appearance. Alert, appropriate and cooperative throughout exam. Head: Normocephalic without obvious abnormalities; pattern alopecia  Eyes: No corneal or conjunctival inflammation noted. Pupils equal round  reactive to light and accommodation. Extraocular motion intact. Vision grossly normal with lenses. Ears: External  ear exam reveals no significant lesions or deformities. L canals clear; some wax on R.Increased hair in ears. Hearing is grossly normal bilaterally. Nose: External nasal exam reveals no deformity or inflammation. Nasal mucosa are pink and moist. No lesions or exudates noted. Septum to R  Mouth: Oral mucosa and oropharynx reveal no lesions or exudates. Teeth in good repair. Neck: No deformities, masses, or tenderness noted. Range of motion decreased. Thyroid substernal . Lungs: Normal respiratory effort; chest expands symmetrically. Lungs are clear to auscultation without rales, wheezes, or increased work of breathing.Decreased BS Heart: Normal rate and rhythm. Normal S1 and S2. No gallop, click, or rub. S4 w/o murmur. Distant heart sounds Abdomen: Bowel sounds normal; abdomen soft and nontender. No masses,or  organomegaly . Ventral hernia noted. Genitalia: Dr Achilles Dunk                                                                   Musculoskeletal/extremities: No deformity or scoliosis noted of  the thoracic or lumbar spine. No clubbing, cyanosis, edema, or deformity noted. Range of motion decreased @ knees & neck .Tone & strength  normal.Joints normal. Nail health  good. Vascular: Carotid, radial artery, dorsalis pedis and  posterior  tibial pulses are full and equal. No bruits present. Neurologic: Alert and oriented x3. Deep tendon reflexes symmetrical and normal.          Skin: Intact without suspicious lesions or rashes. Lymph: No cervical, axillary lymphadenopathy present. Psych: Mood and affect are normal. Normally interactive                                                                                         Assessment & Plan:  #1 Medicare Wellness Exam; criteria met ; data entered #2 Problem List reviewed ; Assessment/ Recommendations made Plan: see Orders

## 2013-01-16 DIAGNOSIS — N35919 Unspecified urethral stricture, male, unspecified site: Secondary | ICD-10-CM | POA: Insufficient documentation

## 2013-01-16 DIAGNOSIS — N138 Other obstructive and reflux uropathy: Secondary | ICD-10-CM | POA: Insufficient documentation

## 2013-01-16 DIAGNOSIS — N529 Male erectile dysfunction, unspecified: Secondary | ICD-10-CM | POA: Insufficient documentation

## 2013-01-16 DIAGNOSIS — R339 Retention of urine, unspecified: Secondary | ICD-10-CM | POA: Insufficient documentation

## 2013-01-16 DIAGNOSIS — R3129 Other microscopic hematuria: Secondary | ICD-10-CM | POA: Insufficient documentation

## 2013-06-02 IMAGING — CT CT OUTSIDE FILMS BODY
2 series · 16 of 42 positions shown, 20 images · non-contrast
Comparison: none

[Series 2: stone study 5.0 soft tissue · axial · 0.79mm/px · z∈[-449,-24]mm · 13 of 96 slices shown, 16 images]
[im 7/96  soft-tissue]
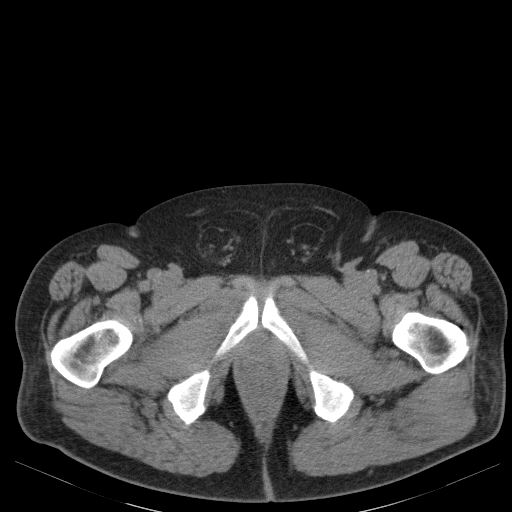
[im 7/96  bone]
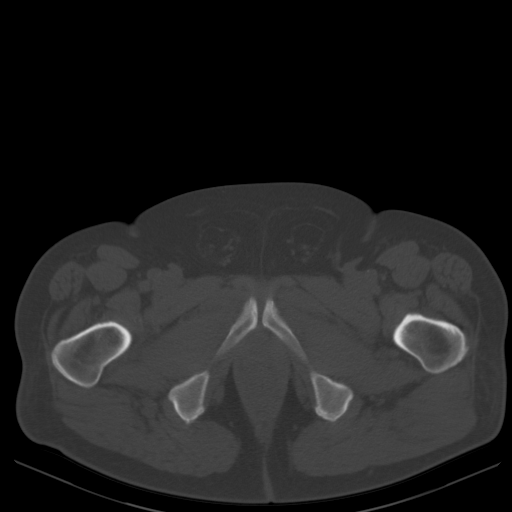
[im 16/96  soft-tissue]
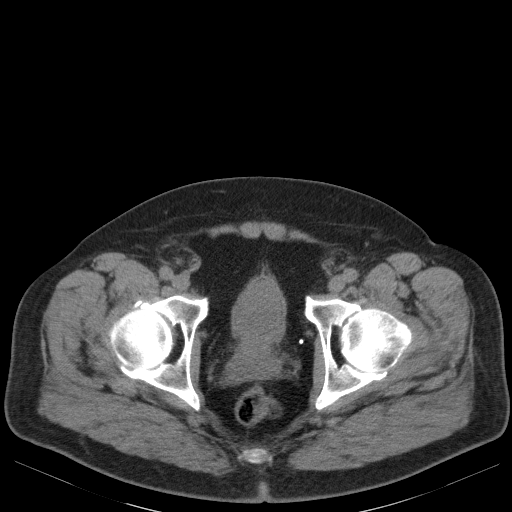
[im 25/96  soft-tissue]
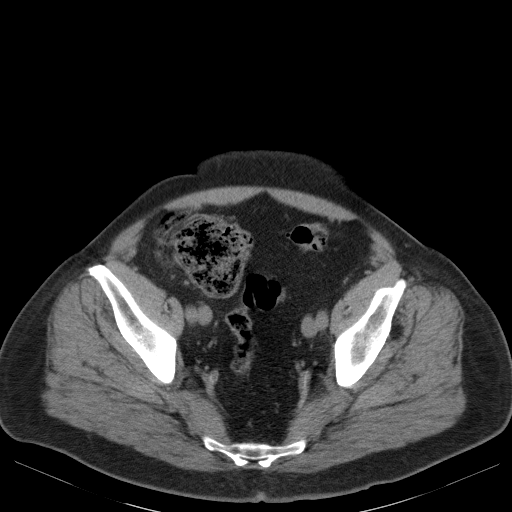
[im 34/96  soft-tissue]
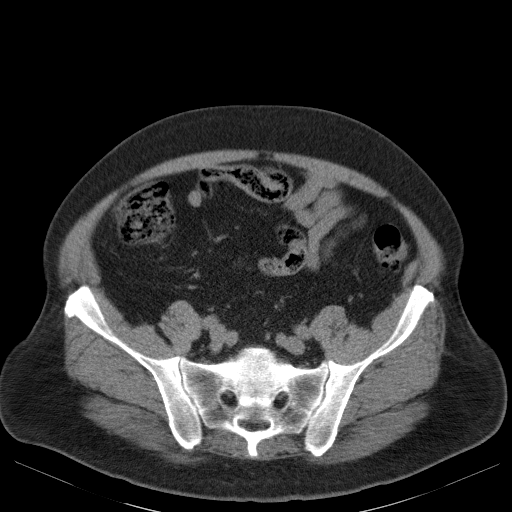
[im 43/96  soft-tissue]
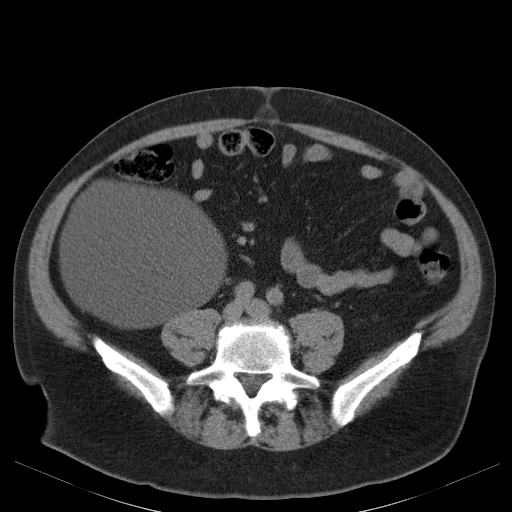
[im 53/96  soft-tissue]
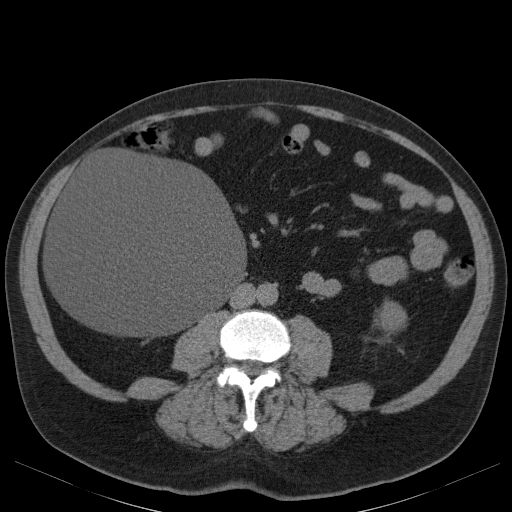
[im 62/96  soft-tissue]
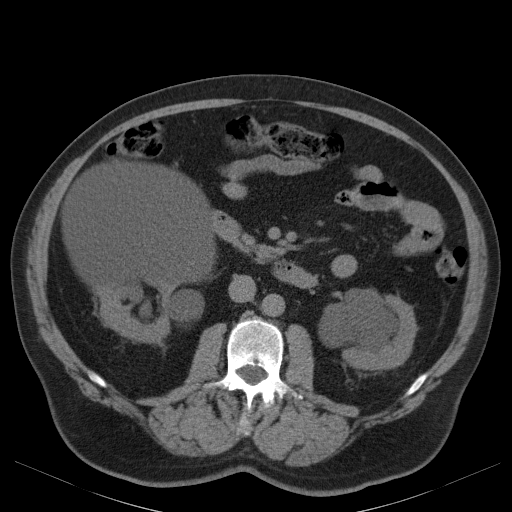
[im 71/96  soft-tissue]
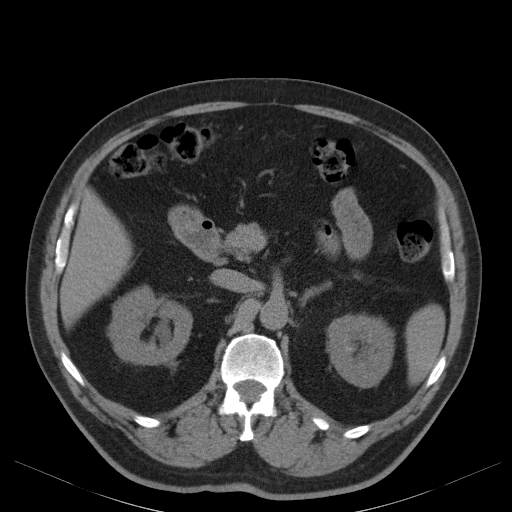
[im 80/96  soft-tissue]
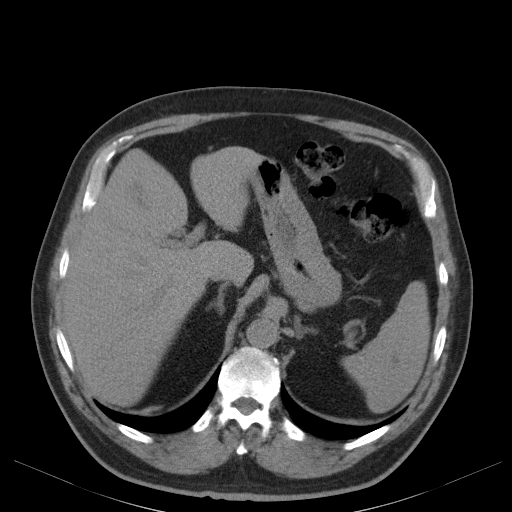
[im 80/96  bone]
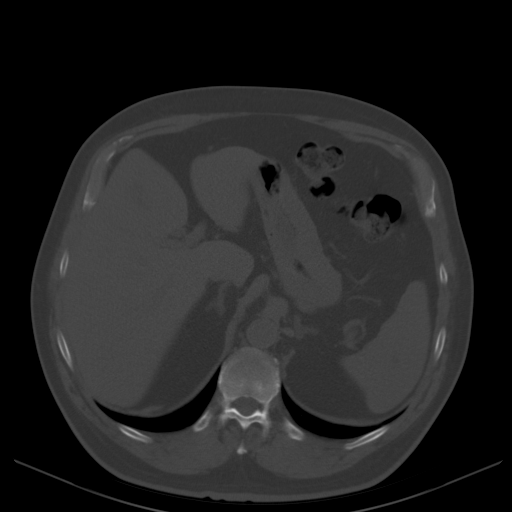
[im 83/96  lung]
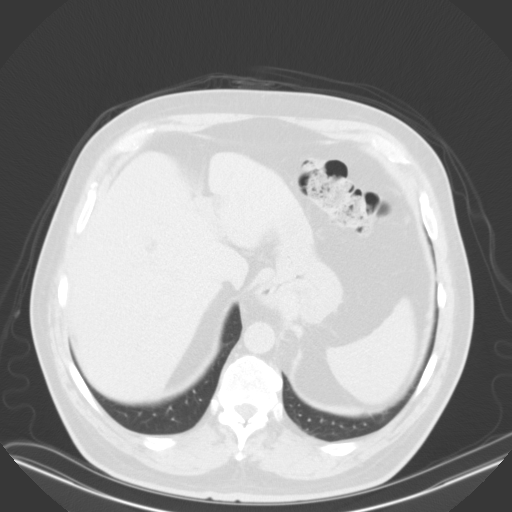
[im 86/96  lung]
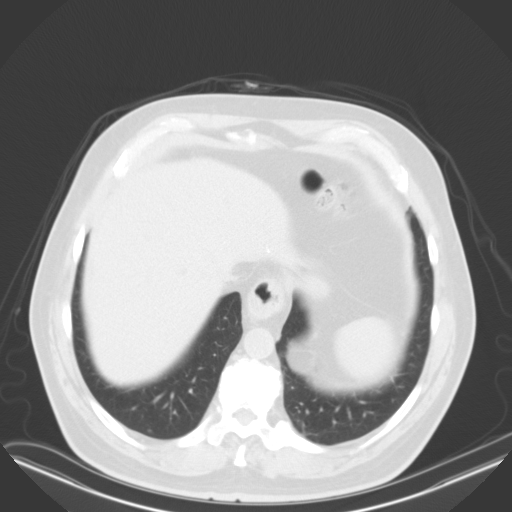
[im 89/96  soft-tissue]
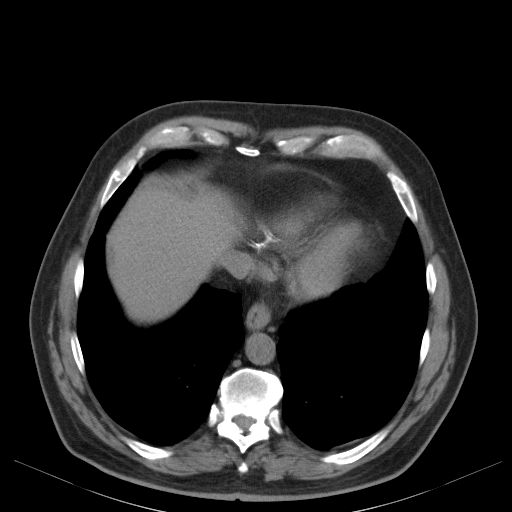
[im 89/96  lung]
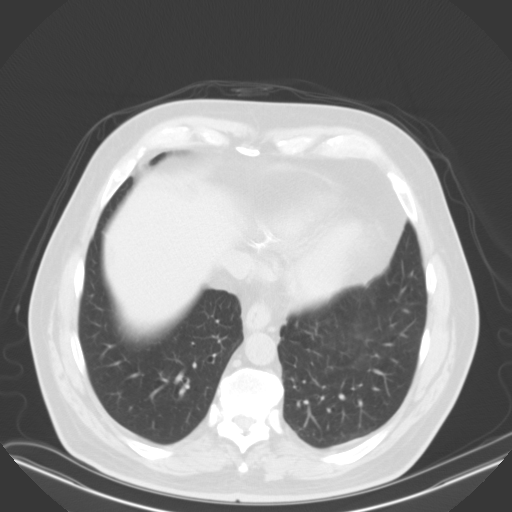
[im 92/96  lung]
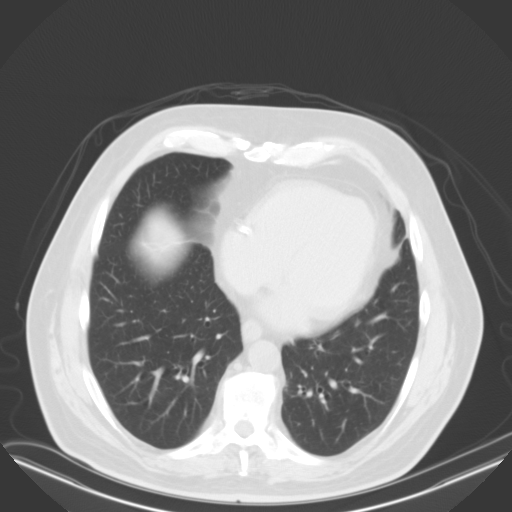

[Series 602: cor 5x3 mpr · coronal · 0.94mm/px · 3 of 92 slices shown, 4 images]
[im 31/92  soft-tissue]
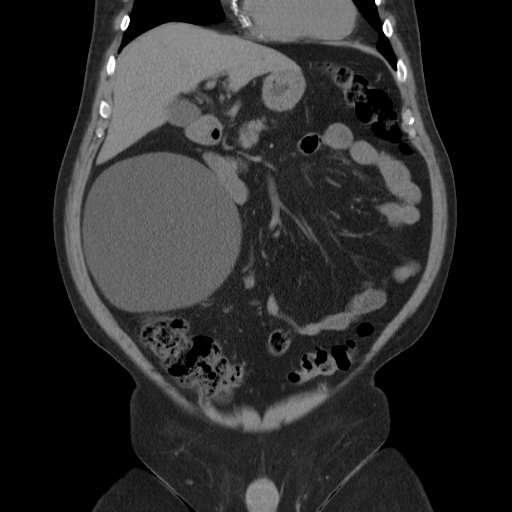
[im 41/92  soft-tissue]
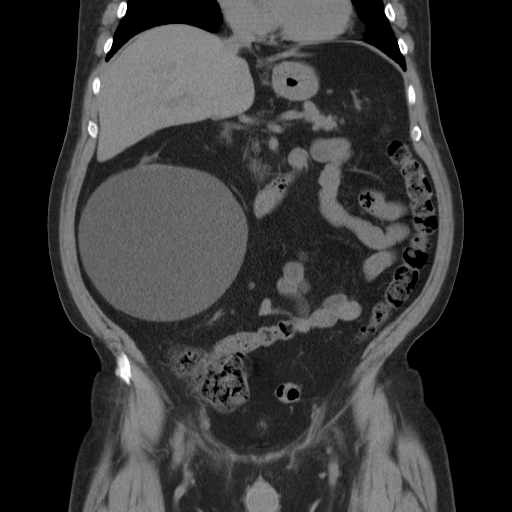
[im 41/92  bone]
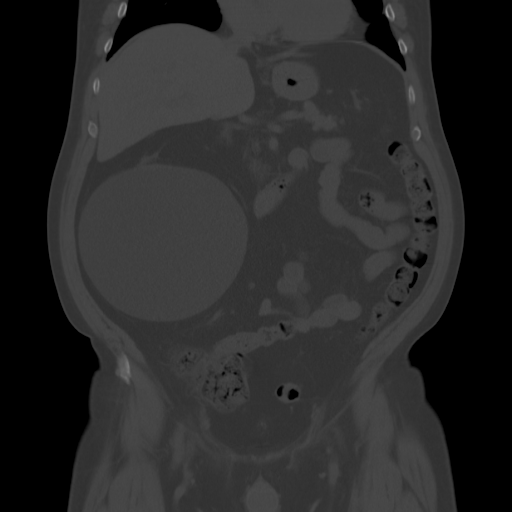
[im 51/92  soft-tissue]
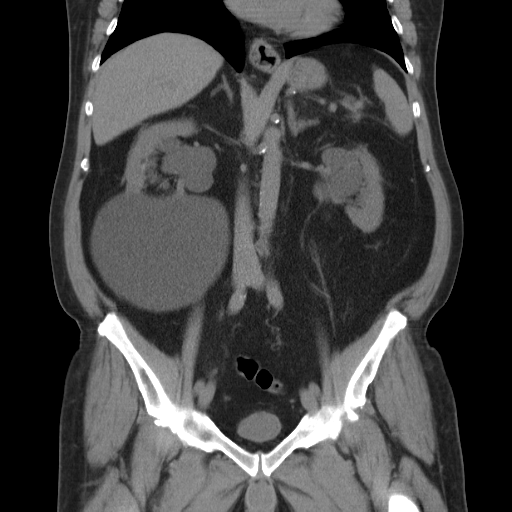

[16 of 42 positions shown; findings below may reference images not displayed]

IMAGES IMPORTED FROM THE SYNGO WORKFLOW SYSTEM
NO DICTATION FOR STUDY

## 2013-07-18 ENCOUNTER — Other Ambulatory Visit: Payer: Self-pay | Admitting: Cardiology

## 2013-07-25 ENCOUNTER — Telehealth: Payer: Self-pay | Admitting: Internal Medicine

## 2013-07-25 NOTE — Telephone Encounter (Signed)
Patient Information:  Caller Name: [name]  Phone: [phone]  Patient: Peter Blair, Peter Blair  Gender: Male  DOB: Mar 06, 1941  Age: 72 Years  PCP: Marga Melnick  Office Follow Up:  Does the office need to follow up with this patient?: No  Instructions For The Office: N/A  RN Note:  Drinking some fluids (approximately 12 oz); last void at 1530. Advised to go to [facility] now.  Symptoms  Reason For Call & Symptoms: Fever, fatigue, increased sleeping, nausea.  Symptoms began after became  overheated while working outdoors 07/22/13.  No vomiting or diarrhea.  Reviewed Health History In EMR: Yes  Reviewed Medications In EMR: Yes  Reviewed Allergies In EMR: Yes  Reviewed Surgeries / Procedures: Yes  Date of Onset of Symptoms: 07/22/2013  Any Fever: Yes  Fever Taken: Oral  Fever Time Of Reading: 00:00:00  Fever Last Reading: 101.6  Guideline(s) Used:  Heat Exposure (Heat Exhaustion and Heat Stroke)  Disposition Per Guideline:   Go to ED Now (or to Office with PCP Approval)  Reason For Disposition Reached:   Patient sounds very sick or weak to the triager  Advice Given:  Call Back If:  You become worse.  RN Overrode Recommendation:  Go To [facility]

## 2013-07-26 ENCOUNTER — Encounter (HOSPITAL_COMMUNITY): Payer: Self-pay

## 2013-07-26 ENCOUNTER — Emergency Department (HOSPITAL_COMMUNITY)
Admission: EM | Admit: 2013-07-26 | Discharge: 2013-07-26 | Disposition: A | Discharge status: Home / Self Care | Payer: Medicare Other | Source: Home / Self Care

## 2013-07-26 DIAGNOSIS — N390 Urinary tract infection, site not specified: Secondary | ICD-10-CM

## 2013-07-26 DIAGNOSIS — B9789 Other viral agents as the cause of diseases classified elsewhere: Secondary | ICD-10-CM

## 2013-07-26 DIAGNOSIS — B349 Viral infection, unspecified: Secondary | ICD-10-CM

## 2013-07-26 LAB — POCT I-STAT, CHEM 8
BUN: 16 mg/dL (ref 6–23)
Calcium, Ion: 1.15 mmol/L (ref 1.13–1.30)
Chloride: 108 mEq/L (ref 96–112)
Creatinine, Ser: 1.2 mg/dL (ref 0.50–1.35)
Glucose, Bld: 119 mg/dL — ABNORMAL HIGH (ref 70–99)
HCT: 45 % (ref 39.0–52.0)
Hemoglobin: 15.3 g/dL (ref 13.0–17.0)
Potassium: 3.7 mEq/L (ref 3.5–5.1)
Sodium: 140 mEq/L (ref 135–145)
TCO2: 22 mmol/L (ref 0–100)

## 2013-07-26 LAB — POCT URINALYSIS DIP (DEVICE)
Hgb urine dipstick: NEGATIVE
Protein, ur: 100 mg/dL — AB
Specific Gravity, Urine: >=1.03 (ref 1.005–1.030)
Urobilinogen, UA: >=8 mg/dL (ref 0.0–1.0)

## 2013-07-26 MED ORDER — ONDANSETRON HCL 4 MG PO TABS: 4.0000 mg | ORAL_TABLET | Freq: Four times a day (QID) | ORAL | Status: DC

## 2013-07-26 MED ORDER — CEPHALEXIN 500 MG PO CAPS: 500.0000 mg | ORAL_CAPSULE | Freq: Four times a day (QID) | ORAL | Status: DC

## 2013-07-26 NOTE — ED Notes (Addendum)
concern for poss "heat exaustion". Had been out of town in [place] on 8-18, where the heat index was elevated , and got dizzy ,vomied and nearly  fainted. Layed around the condo for next few days, not eating or drinking, wife drove home (he never lets me do that, and he barely complained about my driving) Called the nurse line yesterday, and wad advised to get checked at the [facility]  for poss heat related problem. Came yesterday, but clinic closed early , so he came back today for evaluation. No appetite, not wanting to eat or drink C/o pain in back resulted from bending over to pick up clothes from floor Wife states he had a fever of 102 yesterday

## 2013-07-26 NOTE — ED Provider Notes (Signed)
CSN: [id number]     Arrival date & time 07/26/13  1135 History     First MD Initiated Contact with Patient 07/26/13 1254     Chief Complaint  Patient presents with  . Fever   (Consider location/radiation/quality/duration/timing/severity/associated sxs/prior Treatment) HPI Comments: This 72 year old male was with his wife at the beach 5 days ago and was outside working on the boat. The heat index was 102. He developed weakness, headache, fatigue and nausea. He continued to feel bad the following day and has had little oral intake during this time. Approximately 3 days ago he he states he developed a fever of 102 and this has been coming and going. In the last 2-3 days his #1 complaint is nausea. No vomiting. He does say that last p.m. he was beginning to feel a little bit better.   Past Medical History  Diagnosis Date  . Hypertension   . Hyperlipidemia   . Nephrolithiasis     Dr. Achilles Dunk  . Skin cancer     Skin cancer, PMH of, cell type unknown, Dr. Margo Aye  . Right shoulder pain     rotator cuff  . Coronary artery disease     exertional chest pain, cath 12/10 showed EF 50-55% with mild inferior hypokensis, 99% proximal and 60% distal RCA stenosis, 80% prox stenosis of a moderate sized D1, 40% prox LAD. Patient had BMS x2 placed in RCA. Unstable Angina. PROMUS DES to the prox RCA  . Renal cyst   . GERD (gastroesophageal reflux disease)   . Erectile dysfunction   . Colon cancer sister   Past Surgical History  Procedure Laterality Date  . Lithotripsy      x 1  . Cystoscopic removal  04/2010  . Flexibe sigmoidoscopy   2001  . Coronary angioplasty with stent placement      X 3 total stents  . Colonoscopy with ploypectomy  03/2012     Dr Jarold Motto, GI   Family History  Problem Relation Age of Onset  . Breast cancer Mother   . Cervical cancer Mother   . Diabetes Father   . Heart attack Father 55  . Breast cancer Sister   . Colon cancer Sister   . Stroke Neg Hx    History   Substance Use Topics  . Smoking status: Former Smoker    Quit date: 12/05/1970  . Smokeless tobacco: Never Used  . Alcohol Use: Yes     Comment: minimally, 6 beers/year    Review of Systems  Constitutional: Positive for fever, activity change, appetite change and fatigue. Negative for chills.  HENT: Negative for congestion, sore throat, rhinorrhea, sneezing, mouth sores, neck stiffness, postnasal drip and ear discharge.   Gastrointestinal: Positive for nausea. Negative for vomiting, abdominal pain, diarrhea, constipation and blood in stool.  Genitourinary: Negative for dysuria, frequency, hematuria, flank pain, discharge and testicular pain.  Musculoskeletal: Positive for myalgias.       Allergies are primarily in his back and quadriceps that developed after he had been working on his knees while preparing the boat.  Skin: Negative.   Neurological: Positive for weakness and headaches. Negative for tremors, syncope, speech difficulty and light-headedness.    Allergies  Iohexol  Home Medications   Current Outpatient Rx  Name  Route  Sig  Dispense  Refill  . amLODipine (NORVASC) 10 MG tablet   Oral   Take 1 tablet (10 mg total) by mouth daily.   90 tablet   3   . aspirin  81 MG tablet   Oral   Take 81 mg by mouth daily.           . Coenzyme Q10 200 MG TABS   Oral   Take 1 tablet by mouth daily.       0   . CRESTOR 10 MG tablet      TAKE 1 TABLET AT BEDTIME   90 tablet   0   . metoprolol tartrate (LOPRESSOR) 25 MG tablet   Oral   Take 1 tablet (25 mg total) by mouth 2 (two) times daily.   180 tablet   3   . nitroGLYCERIN (NITROSTAT) 0.4 MG SL tablet   Sublingual   Place 0.4 mg under the tongue every 5 (five) minutes as needed.           Marland Kitchen omeprazole (PRILOSEC) 20 MG capsule   Oral   Take 20 mg by mouth daily.           . quinapril (ACCUPRIL) 40 MG tablet   Oral   Take 1 tablet (40 mg total) by mouth daily.   90 tablet   3   . sildenafil (VIAGRA)  100 MG tablet   Oral   Take 1 tablet (100 mg total) by mouth daily as needed. DO NOT TAKE WITH NTG   18 tablet   1   . cephALEXin (KEFLEX) 500 MG capsule   Oral   Take 1 capsule (500 mg total) by mouth 4 (four) times daily.   28 capsule   0   . ondansetron (ZOFRAN) 4 MG tablet   Oral   Take 1 tablet (4 mg total) by mouth every 6 (six) hours. Prn nausea   12 tablet   0    BP 113/71  Pulse 88  Temp(Src) 99.4 F (37.4 C) (Oral)  Resp 14  SpO2 96% Physical Exam  Nursing note and vitals reviewed. Constitutional: He is oriented to person, place, and time. He appears well-developed and well-nourished. No distress.  HENT:  Head: Normocephalic and atraumatic.  Mouth/Throat: Oropharynx is clear and moist. No oropharyngeal exudate.  Bilateral TMs are normal  Eyes: Conjunctivae and EOM are normal. Pupils are equal, round, and reactive to light.  Neck: Normal range of motion. Neck supple.  Cardiovascular: Normal rate, regular rhythm and normal heart sounds.   Pulmonary/Chest: Effort normal and breath sounds normal. No respiratory distress. He has no wheezes. He has no rales.  Abdominal: Soft. Bowel sounds are normal. He exhibits no distension. There is no tenderness.  Musculoskeletal: Normal range of motion. He exhibits no edema and no tenderness.  Lymphadenopathy:    He has no cervical adenopathy.  Neurological: He is alert and oriented to person, place, and time. No cranial nerve deficit.  Skin: Skin is warm and dry. No rash noted.  Psychiatric: He has a normal mood and affect.    ED Course   Procedures (including critical care time)  Labs Reviewed  POCT URINALYSIS DIP (DEVICE) - Abnormal; Notable for the following:    Glucose, UA 100 (*)    Bilirubin Urine SMALL (*)    Protein, ur 100 (*)    Leukocytes, UA TRACE (*)    All other components within normal limits  POCT I-STAT, CHEM 8 - Abnormal; Notable for the following:    Glucose, Bld 119 (*)    All other components  within normal limits  URINE CULTURE   Results for orders placed during the hospital encounter of 07/26/13  POCT URINALYSIS  DIP (DEVICE)      Result Value Range   Glucose, UA 100 (*) NEGATIVE mg/dL   Bilirubin Urine SMALL (*) NEGATIVE   Ketones, ur NEGATIVE  NEGATIVE mg/dL   Specific Gravity, Urine >=1.030  1.005 - 1.030   Hgb urine dipstick NEGATIVE  NEGATIVE   pH 5.5  5.0 - 8.0   Protein, ur 100 (*) NEGATIVE mg/dL   Urobilinogen, UA >=1.6  0.0 - 1.0 mg/dL   Nitrite NEGATIVE  NEGATIVE   Leukocytes, UA TRACE (*) NEGATIVE  POCT I-STAT, CHEM 8      Result Value Range   Sodium 140  135 - 145 mEq/L   Potassium 3.7  3.5 - 5.1 mEq/L   Chloride 108  96 - 112 mEq/L   BUN 16  6 - 23 mg/dL   Creatinine, Ser 1.09  0.50 - 1.35 mg/dL   Glucose, Bld 604 (*) 70 - 99 mg/dL   Calcium, Ion 5.40  9.81 - 1.30 mmol/L   TCO2 22  0 - 100 mmol/L   Hemoglobin 15.3  13.0 - 17.0 g/dL   HCT 19.1  47.8 - 29.5 %    No results found. 1. Heat exposure on board fishing boat, initial encounter   2. Viral syndrome   3. UTI (lower urinary tract infection)     MDM  The patient initially started with a heat-related illness after working a side of his blood in a temperature feel of 102. Many of his symptoms continued through this week. Another consideration would be a viral syndrome since she has been out of the sun for a few days and he has had a fever. The other consideration is urinary tract infection based on his urinalysis. We will treat with Keflex 500 mg 4 times a day for 7 days. Obtain urine culture. Encouraged to drink plenty of fluids and rehydrate. For any worsening new symptoms or problems may need an emergency department in the meantime an appointment with your doctor in case you are not worse but still are feeling poorly.  Hayden Rasmussen, NP 07/26/13 [phone]

## 2013-07-27 NOTE — ED Provider Notes (Signed)
Medical screening examination/treatment/procedure(s) were performed by non-physician practitioner and as supervising physician I was immediately available for consultation/collaboration.   MORENO-COLL,Yara Tomkinson; MD  Helio Lack Moreno-Coll, MD 07/27/13 0852 

## 2013-07-29 LAB — URINE CULTURE: Special Requests: NORMAL

## 2013-07-29 NOTE — ED Notes (Signed)
Urine culture: 45,000 colonies E. Coli.  Pt. adequately treated with Keflex. Vassie Moselle 07/29/2013

## 2013-09-16 ENCOUNTER — Ambulatory Visit (INDEPENDENT_AMBULATORY_CARE_PROVIDER_SITE_OTHER): Payer: Medicare Other | Admitting: Cardiology

## 2013-09-16 ENCOUNTER — Encounter: Payer: Self-pay | Admitting: Cardiology

## 2013-09-16 VITALS — BP 120/76 | HR 67 | Ht 70.0 in | Wt 225.0 lb

## 2013-09-16 DIAGNOSIS — E785 Hyperlipidemia, unspecified: Secondary | ICD-10-CM

## 2013-09-16 DIAGNOSIS — I2510 Atherosclerotic heart disease of native coronary artery without angina pectoris: Secondary | ICD-10-CM

## 2013-09-16 DIAGNOSIS — I10 Essential (primary) hypertension: Secondary | ICD-10-CM

## 2013-09-16 LAB — LIPID PANEL
Total CHOL/HDL Ratio: 3
VLDL: 41.2 mg/dL — ABNORMAL HIGH (ref 0.0–40.0)

## 2013-09-16 MED ORDER — ROSUVASTATIN CALCIUM 10 MG PO TABS: ORAL_TABLET | ORAL | Status: DC

## 2013-09-16 NOTE — Progress Notes (Signed)
Patient ID: Peter Blair, male   DOB: 1941/05/21, 72 y.o.   MRN: [id number] PCP: Dr. Alwyn Ren  72 yo with CAD with severe in-stent restenosis in the proximal RCA in 10/11 with placement of DES to the proximal RCA returns for followup.  Last Cardiolite was a normal study done in 11/13.  He has been doing well from a cardiovascular standpoint.  No chest pain, no exertional dyspnea.  He is not very active due to plantar fasciitis in his left foot.  Weight is stable.    ECG: NSR, LVH  Labs (10/12): LDL 46, HDL 44, K 4.3, creatinine 1.2 Labs (5/13): LDL 50, HDL 41 Labs (8/14): K 3.7, creatinine 1.2  Allergies (verified):  No Known Drug Allergies   Past Medical History:  1. Hypertension  2. Nephrolithiasis.  3. Hyperlipidemia: Myalgias with Lipitor.  4. Skin cancer, hx of, cell type unknown , Dr Margo Aye  5. Right shoulder pain, rotator cuff  6. CAD: Exertional chest pain, cath 12/10 showed EF 50-55% with mild inferior hypokinesis, 99% proximal and 60% distal RCA stenosis, 80% proximal stenosis of a moderate-sized D1, 40% proximal LAD. Patient had BMS x 2 placed in the RCA. Unstable angina 10/11 with LHC showing 99% instent restenosis in proximal RCA. Patient had PROMUS DES to the proximal RCA. EF 60% on LV-gram. ETT-Myoview (11/13) with 7'1" exercise, EF 69%, no evidence for ischemia or infarction.  7. Allergic rhinitis 8. ABIs normal in 8/12.  9. Large renal cyst on right.   Family History:  Father:? DM, MI at 67, smoker  Mother: breast CA, Gyn CA  Siblings: sister breast CA   Social History:  Retired, Theatre manager  Married  Alcohol use-yes:minimally (6 beers /year)  Regular exercise-no  Former Smoker; quit 1982   Current Outpatient Prescriptions  Medication Sig Dispense Refill  . amLODipine (NORVASC) 10 MG tablet Take 1 tablet (10 mg total) by mouth daily.  90 tablet  3  . aspirin 81 MG tablet Take 81 mg by mouth daily.        . cephALEXin (KEFLEX) 500 MG capsule Take 1 capsule  (500 mg total) by mouth 4 (four) times daily.  28 capsule  0  . Coenzyme Q10 200 MG TABS Take 1 tablet by mouth daily.     0  . metoprolol tartrate (LOPRESSOR) 25 MG tablet Take 1 tablet (25 mg total) by mouth 2 (two) times daily.  180 tablet  3  . nitroGLYCERIN (NITROSTAT) 0.4 MG SL tablet Place 0.4 mg under the tongue every 5 (five) minutes as needed.        Marland Kitchen omeprazole (PRILOSEC) 20 MG capsule Take 20 mg by mouth daily.        . ondansetron (ZOFRAN) 4 MG tablet Take 1 tablet (4 mg total) by mouth every 6 (six) hours. Prn nausea  12 tablet  0  . quinapril (ACCUPRIL) 40 MG tablet Take 1 tablet (40 mg total) by mouth daily.  90 tablet  3  . rosuvastatin (CRESTOR) 10 MG tablet TAKE 1 TABLET AT BEDTIME  90 tablet  3  . sildenafil (VIAGRA) 100 MG tablet Take 1 tablet (100 mg total) by mouth daily as needed. DO NOT TAKE WITH NTG  18 tablet  1   No current facility-administered medications for this visit.    BP 120/76  Pulse 67  Ht 5\' 10"  (1.778 m)  Wt 225 lb (102.059 kg)  BMI 32.28 kg/m2 General: NAD, obese.  Neck: No JVD, no thyromegaly or  thyroid nodule.  Lungs: Clear to auscultation bilaterally with normal respiratory effort. CV: Nondisplaced PMI.  Heart regular S1/S2, no S3/S4, no murmur.  No edema.  No carotid bruit.  Abdomen: Soft, nontender, no hepatosplenomegaly, no distention.   Neurologic: Alert and oriented x 3.  Psych: Normal affect. Extremities: No clubbing or cyanosis.   Assessment/Plan:  CAD, NATIVE VESSEL  DES to proximal RCA in-stent restenois in 10/11.   Normal Cardiolite in 11/13.  - Continue metoprolol, ACEI, Crestor, ASA 81.  HYPERLIPIDEMIA Check lipids today, he is fasting.  Hypertension BP is controlled.   Marca Ancona 09/16/2013  09/16/2013 11:51 PM

## 2013-09-16 NOTE — Patient Instructions (Signed)
Your physician recommends that you have  a FASTING lipid profile today.   Your physician wants you to follow-up in: 1 year with Dr Shirlee Latch. (October 2015).  You will receive a reminder letter in the mail two months in advance. If you don't receive a letter, please call our office to schedule the follow-up appointment.

## 2013-10-10 ENCOUNTER — Other Ambulatory Visit: Payer: Self-pay

## 2013-12-16 ENCOUNTER — Telehealth: Payer: Self-pay

## 2013-12-16 NOTE — Telephone Encounter (Signed)
Medication List and allergies:  Reviewed and updated  90 day supply/mail order: [facility] Local prescriptions: [facility] [address]  Immunizations due: Shingles (would like script)  A/P:   No changes to FH or PSH or personal history Tdap--05/05/10 CCS--03/14/12 PSA--09/29/09-2.80 PNA-09/10/10 Flu-12/04/13   To Discuss with Provider: Not at this time.

## 2013-12-17 ENCOUNTER — Ambulatory Visit (INDEPENDENT_AMBULATORY_CARE_PROVIDER_SITE_OTHER): Payer: Medicare Other | Admitting: Internal Medicine

## 2013-12-17 ENCOUNTER — Encounter: Payer: Self-pay | Admitting: Internal Medicine

## 2013-12-17 VITALS — BP 123/68 | HR 69 | Temp 98.2°F | Ht 70.5 in | Wt 220.0 lb

## 2013-12-17 DIAGNOSIS — E785 Hyperlipidemia, unspecified: Secondary | ICD-10-CM

## 2013-12-17 DIAGNOSIS — D126 Benign neoplasm of colon, unspecified: Secondary | ICD-10-CM

## 2013-12-17 DIAGNOSIS — N200 Calculus of kidney: Secondary | ICD-10-CM

## 2013-12-17 DIAGNOSIS — I10 Essential (primary) hypertension: Secondary | ICD-10-CM

## 2013-12-17 DIAGNOSIS — Z0000 Encounter for general adult medical examination without abnormal findings: Secondary | ICD-10-CM

## 2013-12-17 DIAGNOSIS — K635 Polyp of colon: Secondary | ICD-10-CM | POA: Insufficient documentation

## 2013-12-17 DIAGNOSIS — R7309 Other abnormal glucose: Secondary | ICD-10-CM

## 2013-12-17 LAB — MICROALBUMIN / CREATININE URINE RATIO
Creatinine,U: 207.2 mg/dL
Microalb Creat Ratio: 0.2 mg/g (ref 0.0–30.0)
Microalb, Ur: 0.4 mg/dL (ref 0.0–1.9)

## 2013-12-17 MED ORDER — VARICELLA-ZOSTER IMMUNE GLOB 125 UNITS IJ SOLR: INTRAMUSCULAR | Status: DC

## 2013-12-17 NOTE — Progress Notes (Signed)
Subjective:    Patient ID: Peter Blair, male    DOB: [birth year], 73 y.o.   MRN: [id number]  HPI  Medicare Wellness Visit: Psychosocial and medical history were reviewed as required by Medicare (history related to abuse, antisocial behavior , firearm risk). Social history: Caffeine:  , Alcohol: VERY rarely  , Tobacco GGY:IRSW  1972 Exercise: none currently Personal safety/fall risk:no Limitations of activities of daily living: none Seatbelt/ smoke alarm use: yes Healthcare Power of Attorney/Living Will status: yes  Ophthalmologic exam status:current Hearing evaluation status: not current Orientation: Oriented X 3 Memory and recall: accurate 3 word recall  Spelling or math testing: math test passed Depression/anxiety assessment: negative Foreign travel history: n/a Immunization status for influenza/pneumonia/ shingles /tetanus: Transfusion history: none Preventive health care maintenance status: Colonoscopy as per protocol/standard care: 2013 last colonscopy Dental care: last visit 4 months ago.  Chart reviewed and updated. Active issues reviewed and addressed as documented below.    Review of Systems Red meat and fried foods x 2-3/week; no exercise   Family history is negative for premature coronary disease. With CAD  LDL goal is less than 70 . There is medication compliance with the statin.  Low dose ASA taken. BP @ home 119/62-148/82; average 135/73. Specifically denied are  chest pain, palpitations, dyspnea. Reports bilateral calf pain with walking; R plantar fascitis.   Significant abdominal symptoms, memory deficit, or myalgias not present.     Objective:   Physical Exam Gen.:  well-nourished in appearance but central weight excess. Alert, appropriate and cooperative throughout exam. Head: Normocephalic without obvious abnormalities; pattern alopecia  Eyes: No corneal or conjunctival inflammation noted. Pupils equal round reactive to light and accommodation.  Extraocular motion intact. Ears: External  ear exam reveals no significant lesions or deformities. Canals clear .TMs normal. Hearing is grossly normal bilaterally. Nose: External nasal exam reveals no deformity or inflammation. Nasal mucosa are pink and moist. No lesions or exudates noted.  Mouth: Oral mucosa and oropharynx reveal no lesions or exudates. Teeth in good repair. Neck: No deformities, masses, or tenderness noted. Range of motion slightly decreased. Thyroid small. Lungs: Normal respiratory effort; chest expands symmetrically. Lungs are clear to auscultation without rales, wheezes, or increased work of breathing. Heart: Heart sounds distant.Normal rate and rhythm. Normal S1 and S2. No gallop, click, or rub. No murmur. Abdomen: Protuberant.Bowel sounds normal; abdomen soft and nontender. No masses, organomegaly or hernias noted. Genitalia:  as per Dr Jacqlyn Larsen                                 Musculoskeletal/extremities:Accentuated curvature of upper thoracic spine.  No clubbing, cyanosis, edema, or significant extremity  deformity noted. Range of motion decreased in lower extremities .Tone & strength normal. Hand joints normal . Fingernail health good. Able to lie down & sit up w/o help. Negative SLR bilaterally Vascular: Carotid, radial artery, dorsalis pedis and  posterior tibial pulses are full and equal. No bruits present. Neurologic: Alert and oriented x3. Deep tendon reflexes symmetrical and normal.        Skin: Intact without suspicious lesions or rashes. Lymph: No cervical, axillary lymphadenopathy present. Psych: Mood and affect are normal. Normally interactive  Assessment & Plan:  #1 Medicare Wellness Exam; criteria met ; data entered #2 Problem List/Diagnoses reviewed Plan:  Assessments made/ Orders entered

## 2013-12-17 NOTE — Patient Instructions (Signed)
Your next office appointment will be determined based upon review of your pending labs . Those instructions will be transmitted to you through My Chart  or by mail if you're not using this system.  The most common cause of elevated triglycerides (TG) is the ingestion of sugar from high fructose corn syrup sources added to processed foods & drinks.  Eat a low-fat diet with lots of fruits and vegetables, up to 7-9 servings per day. Consume less than 40  Grams (preferably ZERO) of sugar per day from foods & drinks with High Fructose Corn Syrup (HFCS) sugar as #1,2,3 or # 4 on label.[employer], [employer] do not carry products with HFCS. Follow a  low carb nutrition program such as [place] or The New Sugar Busters  to prevent Diabetes progression . White carbohydrates (potatoes, rice, bread, and pasta) have a high spike of sugar and a high load of sugar. For example a  baked potato has a cup of sugar and a  french fry  2 teaspoons of sugar. Yams, wild  rice, whole grained bread &  wheat pasta have been much lower spike and load of  sugar. Portions should be the size of a deck of cards or your palm.

## 2013-12-17 NOTE — Progress Notes (Signed)
Pre visit review using our clinic review tool, if applicable. No additional management support is needed unless otherwise documented below in the visit note. 

## 2013-12-18 ENCOUNTER — Other Ambulatory Visit: Payer: Self-pay | Admitting: Internal Medicine

## 2013-12-18 ENCOUNTER — Telehealth: Payer: Self-pay | Admitting: Internal Medicine

## 2013-12-18 DIAGNOSIS — E1159 Type 2 diabetes mellitus with other circulatory complications: Secondary | ICD-10-CM

## 2013-12-18 LAB — BASIC METABOLIC PANEL
BUN: 12 mg/dL (ref 6–23)
CALCIUM: 9.3 mg/dL (ref 8.4–10.5)
CHLORIDE: 106 meq/L (ref 96–112)
CO2: 26 meq/L (ref 19–32)
Creatinine, Ser: 1 mg/dL (ref 0.4–1.5)
GFR: 77.29 mL/min (ref >60.00)
GLUCOSE: 104 mg/dL — AB (ref 70–99)
POTASSIUM: 4.1 meq/L (ref 3.5–5.1)
SODIUM: 139 meq/L (ref 135–145)

## 2013-12-18 LAB — TSH: TSH: 2.53 u[IU]/mL (ref 0.35–5.50)

## 2013-12-18 LAB — HEMOGLOBIN A1C: Hgb A1c MFr Bld: 6.7 % — ABNORMAL HIGH (ref 4.6–6.5)

## 2013-12-18 NOTE — Telephone Encounter (Signed)
Relevant patient education assigned to patient using Emmi. ° °

## 2013-12-19 ENCOUNTER — Other Ambulatory Visit: Payer: Self-pay | Admitting: *Deleted

## 2013-12-19 ENCOUNTER — Telehealth: Payer: Self-pay | Admitting: Internal Medicine

## 2013-12-19 DIAGNOSIS — I2510 Atherosclerotic heart disease of native coronary artery without angina pectoris: Secondary | ICD-10-CM

## 2013-12-19 MED ORDER — QUINAPRIL HCL 40 MG PO TABS: 40.0000 mg | ORAL_TABLET | Freq: Every day | ORAL | Status: DC

## 2013-12-19 MED ORDER — METOPROLOL TARTRATE 25 MG PO TABS: 25.0000 mg | ORAL_TABLET | Freq: Two times a day (BID) | ORAL | Status: DC

## 2013-12-19 MED ORDER — AMLODIPINE BESYLATE 10 MG PO TABS: 10.0000 mg | ORAL_TABLET | Freq: Every day | ORAL | Status: DC

## 2013-12-19 MED ORDER — SILDENAFIL CITRATE 100 MG PO TABS: 100.0000 mg | ORAL_TABLET | Freq: Every day | ORAL | Status: DC | PRN

## 2013-12-19 NOTE — Telephone Encounter (Signed)
Patient is calling to request a 90-day supply for his 3 blood pressure medications and Viagra. He wants to pick rx's up when ready. Do not send to pharmacy. Patient says that he was told by Dr. Linna Darner that he could not have a 90-day supply until his lab results had came back and they are already back. Please advise.

## 2013-12-19 NOTE — Telephone Encounter (Signed)
Scripts printed, awaiting signature. JG//CMA

## 2013-12-20 NOTE — Telephone Encounter (Signed)
Scripts placed up front for patient to pick up. Patient aware. JG//CMA

## 2014-08-01 ENCOUNTER — Other Ambulatory Visit (INDEPENDENT_AMBULATORY_CARE_PROVIDER_SITE_OTHER): Payer: Medicare Other

## 2014-08-01 ENCOUNTER — Ambulatory Visit (INDEPENDENT_AMBULATORY_CARE_PROVIDER_SITE_OTHER): Payer: Medicare Other | Admitting: Internal Medicine

## 2014-08-01 ENCOUNTER — Ambulatory Visit: Payer: Medicare Other | Admitting: Family Medicine

## 2014-08-01 ENCOUNTER — Encounter: Payer: Self-pay | Admitting: Internal Medicine

## 2014-08-01 ENCOUNTER — Ambulatory Visit (INDEPENDENT_AMBULATORY_CARE_PROVIDER_SITE_OTHER)
Admission: RE | Admit: 2014-08-01 | Discharge: 2014-08-01 | Disposition: A | Discharge status: Home / Self Care | Payer: Medicare Other | Source: Ambulatory Visit | Attending: Internal Medicine | Admitting: Internal Medicine

## 2014-08-01 ENCOUNTER — Other Ambulatory Visit: Payer: Self-pay | Admitting: Internal Medicine

## 2014-08-01 VITALS — BP 140/88 | HR 73 | Temp 98.2°F | Wt 217.0 lb

## 2014-08-01 DIAGNOSIS — IMO0002 Reserved for concepts with insufficient information to code with codable children: Secondary | ICD-10-CM

## 2014-08-01 DIAGNOSIS — M5414 Radiculopathy, thoracic region: Secondary | ICD-10-CM

## 2014-08-01 DIAGNOSIS — R1013 Epigastric pain: Secondary | ICD-10-CM

## 2014-08-01 LAB — CBC WITH DIFFERENTIAL/PLATELET
BASOS ABS: 0 10*3/uL (ref 0.0–0.1)
BASOS PCT: 0.3 % (ref 0.0–3.0)
EOS ABS: 0.1 10*3/uL (ref 0.0–0.7)
Eosinophils Relative: 1.7 % (ref 0.0–5.0)
HEMATOCRIT: 43.8 % (ref 39.0–52.0)
HEMOGLOBIN: 15 g/dL (ref 13.0–17.0)
LYMPHS ABS: 1.4 10*3/uL (ref 0.7–4.0)
Lymphocytes Relative: 22.4 % (ref 12.0–46.0)
MCHC: 34.3 g/dL (ref 30.0–36.0)
MCV: 91.3 fl (ref 78.0–100.0)
Monocytes Absolute: 0.4 10*3/uL (ref 0.1–1.0)
Monocytes Relative: 6 % (ref 3.0–12.0)
NEUTROS ABS: 4.5 10*3/uL (ref 1.4–7.7)
Neutrophils Relative %: 69.6 % (ref 43.0–77.0)
Platelets: 160 10*3/uL (ref 150.0–400.0)
RBC: 4.8 Mil/uL (ref 4.22–5.81)
RDW: 13 % (ref 11.5–15.5)
WBC: 6.4 10*3/uL (ref 4.0–10.5)

## 2014-08-01 LAB — HEPATIC FUNCTION PANEL
ALK PHOS: 62 U/L (ref 39–117)
ALT: 38 U/L (ref 0–53)
AST: 28 U/L (ref 0–37)
Albumin: 3.9 g/dL (ref 3.5–5.2)
BILIRUBIN DIRECT: 0.2 mg/dL (ref 0.0–0.3)
BILIRUBIN TOTAL: 0.8 mg/dL (ref 0.2–1.2)
Total Protein: 7.1 g/dL (ref 6.0–8.3)

## 2014-08-01 LAB — AMYLASE: AMYLASE: 29 U/L (ref 27–131)

## 2014-08-01 LAB — LIPASE: LIPASE: 18 U/L (ref 11.0–59.0)

## 2014-08-01 MED ORDER — TRAMADOL HCL 50 MG PO TABS: 50.0000 mg | ORAL_TABLET | Freq: Three times a day (TID) | ORAL | Status: DC | PRN

## 2014-08-01 NOTE — Progress Notes (Signed)
Pre visit review using our clinic review tool, if applicable. No additional management support is needed unless otherwise documented below in the visit note. 

## 2014-08-01 NOTE — Progress Notes (Signed)
   Subjective:    Patient ID: Peter Blair, male    DOB: Apr 03, 1941, 73 y.o.   MRN: [id number]  HPI  He describes intermittent mid  thoracic pain posteriorly for several months. There was no specific injury or trigger such as repetitive motion.  It is intermittent throughout  the day and can occur even while at rest. Twisting or any activity which jars the spine causes the symptoms. It is described as dull. He does have occasional radiation anteriorly under the rib cage bilaterally. Rest does help as does ibuprofen. The heated car seat has been beneficial as well. He states he will take 2-3 ibuprofen at least twice a week.  He denies any associated neuromuscular or cardiac symptoms.  Additionally he's had epigastric discomfort for the last 2-3 weeks. It is brief.  It can be present even when he does not have the back pain.  He has a past history of GERD and is on over-the-counter protein pump inhibitor daily.  He denies other GI symptoms.    Review of Systems  Fever, chills, sweats, or unexplained weight loss not present. There is no numbness, tingling, or weakness in extremities.  No loss of control of bladder or bowels. Significant dyspepsia, dysphagia, melena, rectal bleeding, or persistently small caliber stools are denied.  He did recently have some bronchitic symptoms which resolved. Otherwise he has no active cardio pulmonary symptoms.  Chest pain, palpitations, tachycardia, exertional dyspnea, paroxysmal nocturnal dyspnea, claudication or edema are absent.         Objective:   Physical Exam  Positive or pertinent findings include: Pattern alopecia is present. There is some decreased range of motion of cervical spine. There is pronounced hirsutism of the external ear canals. Abdomen is protuberant with ventral hernia. There is a 1 x 1 cm granuloma of the right posterior calf. He states that this has actually decreased in size over time and is a chronic  finding.  General appearance :adequately nourished; in no distress. Eyes: No conjunctival inflammation or scleral icterus is present. Oral exam: Dental hygiene is good. Lips and gums are healthy appearing.There is no oropharyngeal erythema or exudate noted.  Heart:  Normal rate and regular rhythm. S1 and S2 normal without gallop, murmur, click, rub or other extra sounds   Lungs:Chest clear to auscultation; no wheezes, rhonchi,rales ,or rubs present.No increased work of breathing.  Abdomen: bowel sounds normal, soft and non-tender without masses or organomegaly .  No guarding or rebound. No flank tenderness to percussion. Skin:Warm & dry.  Intact without suspicious lesions or rashes ; no jaundice or tenting Lymphatic: No lymphadenopathy is noted about the head, neck, axilla Neurologic exam :Strength equal & normal in upper & lower extremities.DTRs normal          Assessment & Plan:  #1thoracic radiculopathy, NSAID rsponsive #2 epigastric pain with PMH GERD with recent increased NSAID use See orders & AVS

## 2014-08-01 NOTE — Patient Instructions (Addendum)
Reflux of gastric acid may be asymptomatic as this may occur mainly during sleep.The triggers for reflux  include stress; the "aspirin family" ; alcohol; peppermint; and caffeine (coffee, tea, cola, and chocolate). The aspirin family would include aspirin and the nonsteroidal agents such as ibuprofen &  Naproxen. Tylenol would not cause reflux. If having symptoms ; food & drink should be avoided for @ least 2 hours before going to bed. Take the protein pump inhibitor 30 minutes before breakfast and 30 minutes before the evening meal for 8 weeks then go back to once a day  30 minutes before breakfast.  Your next office appointment will be determined based upon review of your pending labs & x-rays. Those instructions will be transmitted to you through My Chart  Followup as needed for your acute issue. Please report any significant change in your symptoms.

## 2014-10-12 ENCOUNTER — Other Ambulatory Visit: Payer: Self-pay | Admitting: Cardiology

## 2014-10-23 ENCOUNTER — Ambulatory Visit (INDEPENDENT_AMBULATORY_CARE_PROVIDER_SITE_OTHER): Payer: Medicare Other | Admitting: Internal Medicine

## 2014-10-23 ENCOUNTER — Encounter: Payer: Self-pay | Admitting: Internal Medicine

## 2014-10-23 VITALS — BP 140/80 | HR 71 | Temp 98.1°F | Ht 70.0 in | Wt 219.0 lb

## 2014-10-23 DIAGNOSIS — R7301 Impaired fasting glucose: Secondary | ICD-10-CM

## 2014-10-23 DIAGNOSIS — I2510 Atherosclerotic heart disease of native coronary artery without angina pectoris: Secondary | ICD-10-CM

## 2014-10-23 DIAGNOSIS — E785 Hyperlipidemia, unspecified: Secondary | ICD-10-CM

## 2014-10-23 DIAGNOSIS — Z23 Encounter for immunization: Secondary | ICD-10-CM

## 2014-10-23 DIAGNOSIS — I10 Essential (primary) hypertension: Secondary | ICD-10-CM

## 2014-10-23 NOTE — Progress Notes (Signed)
Pre visit review using our clinic review tool, if applicable. No additional management support is needed unless otherwise documented below in the visit note. 

## 2014-10-23 NOTE — Progress Notes (Signed)
Subjective:    Patient ID: Peter Blair, male    DOB: [birth year], 73 y.o.   MRN: [id number]  HPI Transferring care from Dr Linna Darner Lives near here   Still sees Dr Jacqlyn Larsen Recurrent kidney stones Renal cyst that has been drained once Does 1 year follow up there  Has had 2 stents in 2010--bare metal Revision of 1 with drug eluting inside one of these 2011 Dr Aundra Dubin follows Is on statin, beta blocker, aspirin and ACEI Has never used the nitroglycerin  No chest pain No SOB He checks BP-- 112-150/59-78 Average 134/71 Hasn't really been able to exercise--due to arthritis Had been walking 1 mile a day  Current Outpatient Prescriptions on File Prior to Visit  Medication Sig Dispense Refill  . amLODipine (NORVASC) 10 MG tablet Take 1 tablet (10 mg total) by mouth daily. 90 tablet 3  . aspirin 81 MG tablet Take 81 mg by mouth daily.      . Coenzyme Q10 200 MG TABS Take 1 tablet by mouth daily.   0  . CRESTOR 10 MG tablet TAKE 1 TABLET AT BEDTIME 90 tablet 0  . metoprolol tartrate (LOPRESSOR) 25 MG tablet Take 1 tablet (25 mg total) by mouth 2 (two) times daily. 180 tablet 3  . omeprazole (PRILOSEC) 20 MG capsule Take 20 mg by mouth daily.      . quinapril (ACCUPRIL) 40 MG tablet Take 1 tablet (40 mg total) by mouth daily. 90 tablet 3   No current facility-administered medications on file prior to visit.    Allergies  Allergen Reactions  . Iohexol     Itchy eyes, dry mouth in early 1990s during imaging for renal calculi    Past Medical History  Diagnosis Date  . Hypertension   . Hyperlipidemia   . Nephrolithiasis     Dr. Jacqlyn Larsen  . Skin cancer     Skin cancer, PMH of, cell type unknown, Dr. Nevada Crane  . Right shoulder pain     rotator cuff  . Coronary artery disease     exertional chest pain, cath 12/10 showed EF 50-55% with mild inferior hypokensis, 99% proximal and 60% distal RCA stenosis, 80% prox stenosis of a moderate sized D1, 40% prox LAD. Patient had BMS x2 placed in RCA.  Unstable Angina. PROMUS DES to the prox RCA  . Renal cyst   . GERD (gastroesophageal reflux disease)   . Erectile dysfunction     Past Surgical History  Procedure Laterality Date  . Lithotripsy      x 1  . Cystoscopic removal  04/2010  . Flexibe sigmoidoscopy   2001  . Coronary angioplasty with stent placement      X 3 total stents  . Colonoscopy with ploypectomy  03/2012     Dr Sharlett Iles, GI    Family History  Problem Relation Age of Onset  . Breast cancer Mother   . Cervical cancer Mother   . Diabetes Father   . Heart attack Father 72  . Breast cancer Sister   . Colon cancer Sister     mets to liver & lung  . Stroke Neg Hx     History   Social History  . Marital Status: Married    Spouse Name: N/A    Number of Children: 0  . Years of Education: N/A   Occupational History  . Telephone technician     Retired   Social History Main Topics  . Smoking status: Former Smoker  Quit date: 12/05/1970  . Smokeless tobacco: Never Used     Comment: smoked 1961-1972, up to 1 ppd  . Alcohol Use: Yes     Comment: minimally, 6 beers/year  . Drug Use: No  . Sexual Activity: Not on file   Other Topics Concern  . Not on file   Social History Narrative   Has living will    Wife is health care POA   Would accept resuscitation attempts   No extended tube feedings if cognitively unaware   Review of Systems  Sleeps okay Does have the acid reflux at times--- mostly controlled with omeprazole Snores but doesn't stop breathing per wife Appetite is fine Weight stable     Objective:   Physical Exam  Constitutional: He appears well-developed and well-nourished. No distress.  Neck: Normal range of motion. Neck supple. No thyromegaly present.  Cardiovascular: Normal rate, regular rhythm, normal heart sounds and intact distal pulses.  Exam reveals no gallop.   No murmur heard. Pulmonary/Chest: Effort normal and breath sounds normal. No respiratory distress. He has no  wheezes. He has no rales.  Abdominal: Soft. There is no tenderness.  Musculoskeletal: He exhibits no edema or tenderness.  Lymphadenopathy:    He has no cervical adenopathy.  Psychiatric: He has a normal mood and affect. His behavior is normal.          Assessment & Plan:

## 2014-10-23 NOTE — Assessment & Plan Note (Signed)
No symptoms On appropriate meds 

## 2014-10-23 NOTE — Assessment & Plan Note (Signed)
Feels some of his cramps may be related to the crestor If cholesterol still very low, may want to try just 5mg  daily

## 2014-10-23 NOTE — Assessment & Plan Note (Signed)
BP Readings from Last 3 Encounters:  10/23/14 140/80  08/01/14 140/88  12/17/13 123/68   Good control No changes needed

## 2014-10-23 NOTE — Assessment & Plan Note (Signed)
Will check A1c Discussed weight loss--exercise, healthy eating

## 2014-10-23 NOTE — Addendum Note (Signed)
Addended by: Despina Hidden on: 10/23/2014 05:32 PM   Modules accepted: Orders

## 2014-10-24 LAB — LIPID PANEL
CHOL/HDL RATIO: 3
Cholesterol: 113 mg/dL (ref 0–200)
HDL: 38.2 mg/dL — AB (ref >39.00)
LDL CALC: 44 mg/dL (ref 0–99)
NONHDL: 74.8
Triglycerides: 154 mg/dL — ABNORMAL HIGH (ref 0.0–149.0)
VLDL: 30.8 mg/dL (ref 0.0–40.0)

## 2014-10-24 LAB — HEMOGLOBIN A1C: Hgb A1c MFr Bld: 6.4 % (ref 4.6–6.5)

## 2014-12-15 ENCOUNTER — Telehealth: Payer: Self-pay | Admitting: Family Medicine

## 2014-12-15 MED ORDER — QUINAPRIL HCL 40 MG PO TABS: 40.0000 mg | ORAL_TABLET | Freq: Every day | ORAL | Status: DC

## 2014-12-15 MED ORDER — METOPROLOL TARTRATE 25 MG PO TABS: 25.0000 mg | ORAL_TABLET | Freq: Two times a day (BID) | ORAL | Status: DC

## 2014-12-15 MED ORDER — AMLODIPINE BESYLATE 10 MG PO TABS: 10.0000 mg | ORAL_TABLET | Freq: Every day | ORAL | Status: DC

## 2014-12-15 NOTE — Telephone Encounter (Signed)
amoldipine besylate 10mg      [facility] 90 pills   3 refills metoprolol tartrate 25 mg       [facility] 120 pill   3 refills  quinatril 40mg   [facility] [place]  [phone] 360 pills.  Cheaper to get for 1 year at a time

## 2014-12-15 NOTE — Telephone Encounter (Signed)
Pt left vm on triage phone requesting med refills but did not specify which medications he needed refills on.  Left vm for pt to return call.

## 2014-12-15 NOTE — Telephone Encounter (Signed)
rx sent to pharmacy by e-script  

## 2014-12-17 ENCOUNTER — Encounter: Payer: Self-pay | Admitting: Cardiology

## 2014-12-17 ENCOUNTER — Ambulatory Visit (INDEPENDENT_AMBULATORY_CARE_PROVIDER_SITE_OTHER): Payer: Medicare Other | Admitting: Cardiology

## 2014-12-17 VITALS — BP 130/74 | HR 65 | Ht 70.5 in | Wt 222.1 lb

## 2014-12-17 DIAGNOSIS — I10 Essential (primary) hypertension: Secondary | ICD-10-CM

## 2014-12-17 DIAGNOSIS — E785 Hyperlipidemia, unspecified: Secondary | ICD-10-CM

## 2014-12-17 DIAGNOSIS — I2510 Atherosclerotic heart disease of native coronary artery without angina pectoris: Secondary | ICD-10-CM

## 2014-12-17 MED ORDER — ROSUVASTATIN CALCIUM 10 MG PO TABS: 10.0000 mg | ORAL_TABLET | Freq: Every day | ORAL | Status: DC

## 2014-12-17 NOTE — Patient Instructions (Signed)
Your physician recommends that you return for a FASTING lipid profile /liver profile in 1 month.  Your physician wants you to follow-up in: 1 year with Dr Aundra Dubin. (January 2017).You will receive a reminder letter in the mail two months in advance. If you don't receive a letter, please call our office to schedule the follow-up appointment.

## 2014-12-17 NOTE — Progress Notes (Signed)
Patient ID: Peter Blair, male   DOB: 26-Aug-1941, 74 y.o.   MRN: [id number] PCP: Dr. Silvio Pate  74 yo with CAD with severe in-stent restenosis in the proximal RCA in 10/11 with placement of DES to the proximal RCA returns for followup.  Last Cardiolite was a normal study done in 11/13.  He has been doing well from a cardiovascular standpoint.  No chest pain, no neck/facial pain (this was prior anginal equivalent).  Mild chronic dyspnea when walking up a hill.  He has some muscle soreness and was told by his PCP recently to try cutting Crestor back to 5 mg daily.  This did not make any difference in his symptoms.  He is also taking coenzyme Q10 200 mg daily.  He has some lightheadedness if he bends over then stands up again.  Weight is down 3 lbs.     ECG: NSR, LVH  Labs (10/12): LDL 46, HDL 44, K 4.3, creatinine 1.2 Labs (5/13): LDL 50, HDL 41 Labs (8/14): K 3.7, creatinine 1.2 Labs (11/15): LDL 44, HDL 38  Allergies (verified):  No Known Drug Allergies   Past Medical History:  1. Hypertension  2. Nephrolithiasis.  3. Hyperlipidemia: Myalgias with Lipitor.  4. Skin cancer, hx of, cell type unknown , Dr Nevada Crane  5. Right shoulder pain, rotator cuff  6. CAD: Exertional chest pain, cath 12/10 showed EF 50-55% with mild inferior hypokinesis, 99% proximal and 60% distal RCA stenosis, 80% proximal stenosis of a moderate-sized D1, 40% proximal LAD. Patient had BMS x 2 placed in the RCA. Unstable angina 10/11 with LHC showing 99% instent restenosis in proximal RCA. Patient had PROMUS DES to the proximal RCA. EF 60% on LV-gram. ETT-Myoview (11/13) with 7'1" exercise, EF 69%, no evidence for ischemia or infarction.  7. Allergic rhinitis 8. ABIs normal in 8/12.  9. Large renal cyst on right.   Family History:  Father:? DM, MI at 77, smoker  Mother: breast CA, Gyn CA  Siblings: sister breast CA   Social History:  Retired, Multimedia programmer  Married  Alcohol use-yes:minimally (6 beers /year)   Regular exercise-no  Former Smoker; quit 1982   Current Outpatient Prescriptions  Medication Sig Dispense Refill  . amLODipine (NORVASC) 10 MG tablet Take 1 tablet (10 mg total) by mouth daily. 90 tablet 3  . aspirin 81 MG tablet Take 81 mg by mouth daily.      . Coenzyme Q10 200 MG TABS Take 1 tablet by mouth daily.   0  . metoprolol tartrate (LOPRESSOR) 25 MG tablet Take 1 tablet (25 mg total) by mouth 2 (two) times daily. 180 tablet 3  . omeprazole (PRILOSEC) 20 MG capsule Take 20 mg by mouth daily.      . quinapril (ACCUPRIL) 40 MG tablet Take 1 tablet (40 mg total) by mouth daily. 90 tablet 3  . rosuvastatin (CRESTOR) 10 MG tablet Take 1 tablet (10 mg total) by mouth at bedtime. 90 tablet 3  . sildenafil (REVATIO) 20 MG tablet Take 60-100 mg by mouth as directed.      No current facility-administered medications for this visit.    BP 130/74 mmHg  Pulse 65  Ht 5' 10.5" (1.791 m)  Wt 222 lb 1.9 oz (100.753 kg)  BMI 31.41 kg/m2 General: NAD, obese.  Neck: No JVD, no thyromegaly or thyroid nodule.  Lungs: Clear to auscultation bilaterally with normal respiratory effort. CV: Nondisplaced PMI.  Heart regular S1/S2, no S3/S4, no murmur.  Trace ankle edema.  No  carotid bruit. 2+ PT pulses bilaterally.  Abdomen: Soft, nontender, no hepatosplenomegaly, no distention.   Neurologic: Alert and oriented x 3.  Psych: Normal affect. Extremities: No clubbing or cyanosis.   Assessment/Plan:  CAD DES to proximal RCA in-stent restenois in 10/11.   Normal Cardiolite in 11/13. No ischemic symptoms. - Continue metoprolol, ACEI, Crestor, ASA 81.  HYPERLIPIDEMIA Crestor cut back, did not make a difference in myalgias.  These are manageable.  Continue current Crestor but will need lipids in 1 month.  If LDL > 70, go back to prior Crestor dosing.  Hypertension BP is controlled.   Loralie Champagne 12/17/2014

## 2014-12-18 ENCOUNTER — Other Ambulatory Visit: Payer: Self-pay

## 2014-12-18 MED ORDER — QUINAPRIL HCL 40 MG PO TABS: 40.0000 mg | ORAL_TABLET | Freq: Every day | ORAL | Status: DC

## 2014-12-18 NOTE — Telephone Encounter (Signed)
rx sent to pharmacy by e-script Verbal ok per Dr. Silvio Pate

## 2014-12-18 NOTE — Telephone Encounter (Signed)
Pt has already called and cancelled rx at [facility] for quinapril; pt wants #360 of quinapril sent to [facility] in [place]. Advised had been refilled # 90 x 3 but pt said cheaper if gets #360 at one time. Pt request cb.

## 2015-01-16 ENCOUNTER — Other Ambulatory Visit (INDEPENDENT_AMBULATORY_CARE_PROVIDER_SITE_OTHER): Payer: Medicare Other | Admitting: *Deleted

## 2015-01-16 DIAGNOSIS — I10 Essential (primary) hypertension: Secondary | ICD-10-CM

## 2015-01-16 DIAGNOSIS — E785 Hyperlipidemia, unspecified: Secondary | ICD-10-CM

## 2015-01-16 DIAGNOSIS — I2510 Atherosclerotic heart disease of native coronary artery without angina pectoris: Secondary | ICD-10-CM

## 2015-01-16 LAB — LIPID PANEL
CHOL/HDL RATIO: 3
Cholesterol: 113 mg/dL (ref 0–200)
HDL: 36 mg/dL — ABNORMAL LOW (ref >39.00)
LDL CALC: 52 mg/dL (ref 0–99)
NONHDL: 77
Triglycerides: 126 mg/dL (ref 0.0–149.0)
VLDL: 25.2 mg/dL (ref 0.0–40.0)

## 2015-01-16 LAB — HEPATIC FUNCTION PANEL
ALT: 27 U/L (ref 0–53)
AST: 21 U/L (ref 0–37)
Albumin: 4 g/dL (ref 3.5–5.2)
Alkaline Phosphatase: 65 U/L (ref 39–117)
Bilirubin, Direct: 0.1 mg/dL (ref 0.0–0.3)
Total Bilirubin: 0.6 mg/dL (ref 0.2–1.2)
Total Protein: 7 g/dL (ref 6.0–8.3)

## 2015-08-05 ENCOUNTER — Encounter: Payer: Self-pay | Admitting: Internal Medicine

## 2015-08-05 ENCOUNTER — Ambulatory Visit (INDEPENDENT_AMBULATORY_CARE_PROVIDER_SITE_OTHER): Payer: Medicare Other | Admitting: Internal Medicine

## 2015-08-05 VITALS — BP 140/80 | HR 67 | Temp 97.9°F | Ht 71.0 in | Wt 220.0 lb

## 2015-08-05 DIAGNOSIS — I2510 Atherosclerotic heart disease of native coronary artery without angina pectoris: Secondary | ICD-10-CM

## 2015-08-05 DIAGNOSIS — N200 Calculus of kidney: Secondary | ICD-10-CM

## 2015-08-05 DIAGNOSIS — Z0000 Encounter for general adult medical examination without abnormal findings: Secondary | ICD-10-CM | POA: Diagnosis not present

## 2015-08-05 DIAGNOSIS — I10 Essential (primary) hypertension: Secondary | ICD-10-CM | POA: Diagnosis not present

## 2015-08-05 DIAGNOSIS — K219 Gastro-esophageal reflux disease without esophagitis: Secondary | ICD-10-CM

## 2015-08-05 DIAGNOSIS — E785 Hyperlipidemia, unspecified: Secondary | ICD-10-CM

## 2015-08-05 DIAGNOSIS — Z7189 Other specified counseling: Secondary | ICD-10-CM

## 2015-08-05 LAB — CBC WITH DIFFERENTIAL/PLATELET
BASOS PCT: 0.3 % (ref 0.0–3.0)
Basophils Absolute: 0 10*3/uL (ref 0.0–0.1)
EOS PCT: 1.2 % (ref 0.0–5.0)
Eosinophils Absolute: 0.1 10*3/uL (ref 0.0–0.7)
HCT: 45.5 % (ref 39.0–52.0)
Hemoglobin: 15.4 g/dL (ref 13.0–17.0)
LYMPHS ABS: 1.6 10*3/uL (ref 0.7–4.0)
Lymphocytes Relative: 23.1 % (ref 12.0–46.0)
MCHC: 33.8 g/dL (ref 30.0–36.0)
MCV: 91.7 fl (ref 78.0–100.0)
MONO ABS: 0.5 10*3/uL (ref 0.1–1.0)
Monocytes Relative: 7.9 % (ref 3.0–12.0)
NEUTROS PCT: 67.5 % (ref 43.0–77.0)
Neutro Abs: 4.6 10*3/uL (ref 1.4–7.7)
Platelets: 148 10*3/uL — ABNORMAL LOW (ref 150.0–400.0)
RBC: 4.96 Mil/uL (ref 4.22–5.81)
RDW: 13.2 % (ref 11.5–15.5)
WBC: 6.8 10*3/uL (ref 4.0–10.5)

## 2015-08-05 LAB — COMPREHENSIVE METABOLIC PANEL
ALT: 23 U/L (ref 0–53)
AST: 20 U/L (ref 0–37)
Albumin: 4.2 g/dL (ref 3.5–5.2)
Alkaline Phosphatase: 62 U/L (ref 39–117)
BUN: 15 mg/dL (ref 6–23)
CO2: 29 meq/L (ref 19–32)
Calcium: 9.5 mg/dL (ref 8.4–10.5)
Chloride: 105 mEq/L (ref 96–112)
Creatinine, Ser: 0.99 mg/dL (ref 0.40–1.50)
GFR: 78.73 mL/min (ref >60.00)
GLUCOSE: 128 mg/dL — AB (ref 70–99)
POTASSIUM: 4.3 meq/L (ref 3.5–5.1)
SODIUM: 138 meq/L (ref 135–145)
Total Bilirubin: 0.7 mg/dL (ref 0.2–1.2)
Total Protein: 7.2 g/dL (ref 6.0–8.3)

## 2015-08-05 LAB — LIPID PANEL
CHOLESTEROL: 118 mg/dL (ref 0–200)
HDL: 37.8 mg/dL — AB (ref >39.00)
LDL Cholesterol: 58 mg/dL (ref 0–99)
NonHDL: 79.94
Total CHOL/HDL Ratio: 3
Triglycerides: 110 mg/dL (ref 0.0–149.0)
VLDL: 22 mg/dL (ref 0.0–40.0)

## 2015-08-05 LAB — T4, FREE: FREE T4: 0.8 ng/dL (ref 0.60–1.60)

## 2015-08-05 NOTE — Assessment & Plan Note (Signed)
Tolerates the lower dose of the statin

## 2015-08-05 NOTE — Progress Notes (Signed)
Subjective:    Patient ID: Peter Blair, male    DOB: [birth year], 74 y.o.   MRN: [id number]  HPI Here for Medicare wellness and follow up of chronic medical conditions Reviewed form and advanced directives Reviewed other doctors No alcohol or very rare No tobacco now No exercise---discussed No falls No depression or anhedonia Vision is okay--new Rx is helping. Hearing is okay Independent with instrumental ADLs No significant memory problems  No new concerns Keeps up with Dr Aundra Dubin No chest pain or SOB No palpitations No dizziness or syncope. Rare lightheadedness if gets up very fast after head is very low No edema  Sees Dr Jacqlyn Larsen yearly No kidney stones at present Checks cyst on kidney regularly--no recent change  Takes the prilosec regularly Tried to cut back to every other day--but didn't tolerate No swallowing problems  Still satisfied with sildenafil No problems  No apparent problems with statin Only taking 1/2 daily--full tab gave him myalgias  Some trouble with arthritis Mostly in knees Some Achilles pain and has had plantar fasciitis  Current Outpatient Prescriptions on File Prior to Visit  Medication Sig Dispense Refill  . amLODipine (NORVASC) 10 MG tablet Take 1 tablet (10 mg total) by mouth daily. 90 tablet 3  . aspirin 81 MG tablet Take 81 mg by mouth daily.      . Coenzyme Q10 200 MG TABS Take 1 tablet by mouth daily.   0  . metoprolol tartrate (LOPRESSOR) 25 MG tablet Take 1 tablet (25 mg total) by mouth 2 (two) times daily. 180 tablet 3  . omeprazole (PRILOSEC) 20 MG capsule Take 20 mg by mouth daily.      . quinapril (ACCUPRIL) 40 MG tablet Take 1 tablet (40 mg total) by mouth daily. 360 tablet 0  . rosuvastatin (CRESTOR) 10 MG tablet Take 1 tablet (10 mg total) by mouth at bedtime. 90 tablet 3  . sildenafil (REVATIO) 20 MG tablet Take 60-100 mg by mouth as directed.      No current facility-administered medications on file prior to visit.     Allergies  Allergen Reactions  . Iohexol     Itchy eyes, dry mouth in early 1990s during imaging for renal calculi    Past Medical History  Diagnosis Date  . Hypertension   . Hyperlipidemia   . Nephrolithiasis     Dr. Jacqlyn Larsen  . Skin cancer     Skin cancer, PMH of, cell type unknown, Dr. Nevada Crane  . Right shoulder pain     rotator cuff  . Coronary artery disease     exertional chest pain, cath 12/10 showed EF 50-55% with mild inferior hypokensis, 99% proximal and 60% distal RCA stenosis, 80% prox stenosis of a moderate sized D1, 40% prox LAD. Patient had BMS x2 placed in RCA. Unstable Angina. PROMUS DES to the prox RCA  . Renal cyst   . GERD (gastroesophageal reflux disease)   . Erectile dysfunction     Past Surgical History  Procedure Laterality Date  . Lithotripsy      x 1  . Cystoscopic removal  04/2010  . Flexibe sigmoidoscopy   2001  . Coronary angioplasty with stent placement      X 3 total stents  . Colonoscopy with ploypectomy  03/2012     Dr Sharlett Iles, GI    Family History  Problem Relation Age of Onset  . Breast cancer Mother   . Cervical cancer Mother   . Diabetes Father   . Heart  attack Father 71  . Breast cancer Sister   . Colon cancer Sister     mets to liver & lung  . Stroke Neg Hx     Social History   Social History  . Marital Status: Married    Spouse Name: N/A  . Number of Children: 0  . Years of Education: N/A   Occupational History  . Telephone technician     Retired   Social History Main Topics  . Smoking status: Former Smoker    Quit date: 12/05/1970  . Smokeless tobacco: Never Used     Comment: smoked 1961-1972, up to 1 ppd  . Alcohol Use: Yes     Comment: minimally, 6 beers/year  . Drug Use: No  . Sexual Activity: Not on file   Other Topics Concern  . Not on file   Social History Narrative   Has living will    Wife is health care POA   Would accept resuscitation attempts   No extended tube feedings if cognitively unaware    Review of Systems Wears seat belt Sees dentist every 4 months---watching gums Appetite is fine Weight about stable Sleeps fine most of the time Keeps up with dermatologist--- multiple areas frozen Voids okay. Has still been getting PSA tests    Objective:   Physical Exam  Constitutional: He is oriented to person, place, and time. He appears well-developed and well-nourished. No distress.  HENT:  Mouth/Throat: Oropharynx is clear and moist.  Neck: Normal range of motion. Neck supple. No thyromegaly present.  Cardiovascular: Normal rate, regular rhythm, normal heart sounds and intact distal pulses.  Exam reveals no gallop.   No murmur heard. Pulmonary/Chest: Effort normal and breath sounds normal. No respiratory distress. He has no wheezes. He has no rales.  Abdominal: Soft. There is no tenderness.  Musculoskeletal: He exhibits no edema or tenderness.  Lymphadenopathy:    He has no cervical adenopathy.  Neurological: He is alert and oriented to person, place, and time.  President-- "Obama, Anne Hahn--- (then) Clinton" [id number] D-l-[id number] Recall 3/3  Skin: No rash noted. No erythema.  Various keratosis  Psychiatric: He has a normal mood and affect. His behavior is normal.          Assessment & Plan:

## 2015-08-05 NOTE — Assessment & Plan Note (Signed)
Didn't tolerate any decrease in PPI dose

## 2015-08-05 NOTE — Assessment & Plan Note (Signed)
See social history 

## 2015-08-05 NOTE — Patient Instructions (Signed)
DASH Eating Plan DASH stands for "Dietary Approaches to Stop Hypertension." The DASH eating plan is a healthy eating plan that has been shown to reduce high blood pressure (hypertension). Additional health benefits may include reducing the risk of type 2 diabetes mellitus, heart disease, and stroke. The DASH eating plan may also help with weight loss. WHAT DO I NEED TO KNOW ABOUT THE DASH EATING PLAN? For the DASH eating plan, you will follow these general guidelines:  Choose foods with a percent daily value for sodium of less than 5% (as listed on the food label).  Use salt-free seasonings or herbs instead of table salt or sea salt.  Check with your health care provider or pharmacist before using salt substitutes.  Eat lower-sodium products, often labeled as "lower sodium" or "no salt added."  Eat fresh foods.  Eat more vegetables, fruits, and low-fat dairy products.  Choose whole grains. Look for the word "whole" as the first word in the ingredient list.  Choose fish and skinless chicken or turkey more often than red meat. Limit fish, poultry, and meat to 6 oz (170 g) each day.  Limit sweets, desserts, sugars, and sugary drinks.  Choose heart-healthy fats.  Limit cheese to 1 oz (28 g) per day.  Eat more home-cooked food and less restaurant, buffet, and fast food.  Limit fried foods.  Cook foods using methods other than frying.  Limit canned vegetables. If you do use them, rinse them well to decrease the sodium.  When eating at a restaurant, ask that your food be prepared with less salt, or no salt if possible. WHAT FOODS CAN I EAT? Seek help from a dietitian for individual calorie needs. Grains Whole grain or whole wheat bread. Brown rice. Whole grain or whole wheat pasta. Quinoa, bulgur, and whole grain cereals. Low-sodium cereals. Corn or whole wheat flour tortillas. Whole grain cornbread. Whole grain crackers. Low-sodium crackers. Vegetables Fresh or frozen vegetables  (raw, steamed, roasted, or grilled). Low-sodium or reduced-sodium tomato and vegetable juices. Low-sodium or reduced-sodium tomato sauce and paste. Low-sodium or reduced-sodium canned vegetables.  Fruits All fresh, canned (in natural juice), or frozen fruits. Meat and Other Protein Products Ground beef (85% or leaner), grass-fed beef, or beef trimmed of fat. Skinless chicken or turkey. Ground chicken or turkey. Pork trimmed of fat. All fish and seafood. Eggs. Dried beans, peas, or lentils. Unsalted nuts and seeds. Unsalted canned beans. Dairy Low-fat dairy products, such as skim or 1% milk, 2% or reduced-fat cheeses, low-fat ricotta or cottage cheese, or plain low-fat yogurt. Low-sodium or reduced-sodium cheeses. Fats and Oils Tub margarines without trans fats. Light or reduced-fat mayonnaise and salad dressings (reduced sodium). Avocado. Safflower, olive, or canola oils. Natural peanut or almond butter. Other Unsalted popcorn and pretzels. The items listed above may not be a complete list of recommended foods or beverages. Contact your dietitian for more options. WHAT FOODS ARE NOT RECOMMENDED? Grains White bread. White pasta. White rice. Refined cornbread. Bagels and croissants. Crackers that contain trans fat. Vegetables Creamed or fried vegetables. Vegetables in a cheese sauce. Regular canned vegetables. Regular canned tomato sauce and paste. Regular tomato and vegetable juices. Fruits Dried fruits. Canned fruit in light or heavy syrup. Fruit juice. Meat and Other Protein Products Fatty cuts of meat. Ribs, chicken wings, bacon, sausage, bologna, salami, chitterlings, fatback, hot dogs, bratwurst, and packaged luncheon meats. Salted nuts and seeds. Canned beans with salt. Dairy Whole or 2% milk, cream, half-and-half, and cream cheese. Whole-fat or sweetened yogurt. Full-fat   cheeses or blue cheese. Nondairy creamers and whipped toppings. Processed cheese, cheese spreads, or cheese  curds. Condiments Onion and garlic salt, seasoned salt, table salt, and sea salt. Canned and packaged gravies. Worcestershire sauce. Tartar sauce. Barbecue sauce. Teriyaki sauce. Soy sauce, including reduced sodium. Steak sauce. Fish sauce. Oyster sauce. Cocktail sauce. Horseradish. Ketchup and mustard. Meat flavorings and tenderizers. Bouillon cubes. Hot sauce. Tabasco sauce. Marinades. Taco seasonings. Relishes. Fats and Oils Butter, stick margarine, lard, shortening, ghee, and bacon fat. Coconut, palm kernel, or palm oils. Regular salad dressings. Other Pickles and olives. Salted popcorn and pretzels. The items listed above may not be a complete list of foods and beverages to avoid. Contact your dietitian for more information. WHERE CAN I FIND MORE INFORMATION? National Heart, Lung, and Blood Institute: www.nhlbi.nih.gov/health/health-topics/topics/dash/ Document Released: 11/10/2011 Document Revised: 04/07/2014 Document Reviewed: 09/25/2013 ExitCare Patient Information 2015 [employer]. This information is not intended to replace advice given to you by your health care provider. Make sure you discuss any questions you have with your health care provider. Exercise to Lose Weight Exercise and a healthy diet may help you lose weight. Your doctor may suggest specific exercises. EXERCISE IDEAS AND TIPS  Choose low-cost things you enjoy doing, such as walking, bicycling, or exercising to workout videos.  Take stairs instead of the elevator.  Walk during your lunch break.  Park your car further away from work or school.  Go to a gym or an exercise class.  Start with 5 to 10 minutes of exercise each day. Build up to 30 minutes of exercise 4 to 6 days a week.  Wear shoes with good support and comfortable clothes.  Stretch before and after working out.  Work out until you breathe harder and your heart beats faster.  Drink extra water when you exercise.  Do not do so much that you  hurt yourself, feel dizzy, or get very short of breath. Exercises that burn about 150 calories:  Running 1  miles in 15 minutes.  Playing volleyball for 45 to 60 minutes.  Washing and waxing a car for 45 to 60 minutes.  Playing touch football for 45 minutes.  Walking 1  miles in 35 minutes.  Pushing a stroller 1  miles in 30 minutes.  Playing basketball for 30 minutes.  Raking leaves for 30 minutes.  Bicycling 5 miles in 30 minutes.  Walking 2 miles in 30 minutes.  Dancing for 30 minutes.  Shoveling snow for 15 minutes.  Swimming laps for 20 minutes.  Walking up stairs for 15 minutes.  Bicycling 4 miles in 15 minutes.  Gardening for 30 to 45 minutes.  Jumping rope for 15 minutes.  Washing windows or floors for 45 to 60 minutes. Document Released: 12/24/2010 Document Revised: 02/13/2012 Document Reviewed: 12/24/2010 ExitCare Patient Information 2015 Exit[employer], [employer]. This information is not intended to replace advice given to you by your health care provider. Make sure you discuss any questions you have with your health care provider.  

## 2015-08-05 NOTE — Assessment & Plan Note (Signed)
I have personally reviewed the Medicare Annual Wellness questionnaire and have noted 1. The patient's medical and social history 2. Their use of alcohol, tobacco or illicit drugs 3. Their current medications and supplements 4. The patient's functional ability including ADL's, fall risks, home safety risks and hearing or visual             impairment. 5. Diet and physical activities 6. Evidence for depression or mood disorders  The patients weight, height, BMI and visual acuity have been recorded in the chart I have made referrals, counseling and provided education to the patient based review of the above and I have provided the pt with a written personalized care plan for preventive services.  I have provided you with a copy of your personalized plan for preventive services. Please take the time to review along with your updated medication list.  He will get flu vaccine in October I recommended no PSAs--but he is getting them from Dr Jacqlyn Larsen Next colonoscopy due 2023--probably not appropriate at that time UTD otherwise Info on exercise and healthy eating

## 2015-08-05 NOTE — Progress Notes (Signed)
Pre visit review using our clinic review tool, if applicable. No additional management support is needed unless otherwise documented below in the visit note. 

## 2015-08-05 NOTE — Assessment & Plan Note (Signed)
Quiet Keeps up with Dr Jacqlyn Larsen

## 2015-08-05 NOTE — Assessment & Plan Note (Signed)
No symptoms On appropriate Rx 

## 2015-08-05 NOTE — Assessment & Plan Note (Signed)
BP Readings from Last 3 Encounters:  08/05/15 140/80  12/17/14 130/74  10/23/14 140/80   Average at home 137/73 No changes needed

## 2015-08-06 ENCOUNTER — Other Ambulatory Visit: Payer: Self-pay | Admitting: Internal Medicine

## 2015-08-06 DIAGNOSIS — R7301 Impaired fasting glucose: Secondary | ICD-10-CM

## 2015-09-16 ENCOUNTER — Other Ambulatory Visit (INDEPENDENT_AMBULATORY_CARE_PROVIDER_SITE_OTHER): Payer: Medicare Other

## 2015-09-16 DIAGNOSIS — R7301 Impaired fasting glucose: Secondary | ICD-10-CM | POA: Diagnosis not present

## 2015-09-16 LAB — GLUCOSE, RANDOM: Glucose, Bld: 133 mg/dL — ABNORMAL HIGH (ref 70–99)

## 2015-09-16 LAB — HEMOGLOBIN A1C: Hgb A1c MFr Bld: 6.4 % (ref 4.6–6.5)

## 2015-11-05 ENCOUNTER — Other Ambulatory Visit: Payer: Self-pay | Admitting: Internal Medicine

## 2015-11-05 NOTE — Telephone Encounter (Signed)
Pt request status of quinapril refill; advised pt done; pt will ck with pharmacy.

## 2015-12-17 ENCOUNTER — Other Ambulatory Visit: Payer: Medicare Other

## 2015-12-24 ENCOUNTER — Other Ambulatory Visit: Payer: Self-pay | Admitting: Internal Medicine

## 2016-01-21 ENCOUNTER — Encounter: Payer: Self-pay | Admitting: Cardiology

## 2016-01-21 ENCOUNTER — Ambulatory Visit (INDEPENDENT_AMBULATORY_CARE_PROVIDER_SITE_OTHER): Payer: Medicare Other | Admitting: Cardiology

## 2016-01-21 VITALS — BP 134/70 | HR 70 | Ht 71.0 in | Wt 211.0 lb

## 2016-01-21 DIAGNOSIS — Z79899 Other long term (current) drug therapy: Secondary | ICD-10-CM | POA: Diagnosis not present

## 2016-01-21 DIAGNOSIS — E785 Hyperlipidemia, unspecified: Secondary | ICD-10-CM

## 2016-01-21 DIAGNOSIS — I2510 Atherosclerotic heart disease of native coronary artery without angina pectoris: Secondary | ICD-10-CM

## 2016-01-21 LAB — LIPID PANEL
Cholesterol: 121 mg/dL — ABNORMAL LOW (ref 125–200)
HDL: 39 mg/dL — ABNORMAL LOW (ref >=40)
LDL Cholesterol: 59 mg/dL (ref <130)
Total CHOL/HDL Ratio: 3.1 Ratio (ref <=5.0)
Triglycerides: 113 mg/dL (ref <150)
VLDL: 23 mg/dL (ref <30)

## 2016-01-21 LAB — HEPATIC FUNCTION PANEL
ALBUMIN: 4.3 g/dL (ref 3.6–5.1)
ALT: 19 U/L (ref 9–46)
AST: 18 U/L (ref 10–35)
Alkaline Phosphatase: 63 U/L (ref 40–115)
Bilirubin, Direct: 0.2 mg/dL (ref <=0.2)
Indirect Bilirubin: 0.5 mg/dL (ref 0.2–1.2)
TOTAL PROTEIN: 7.3 g/dL (ref 6.1–8.1)
Total Bilirubin: 0.7 mg/dL (ref 0.2–1.2)

## 2016-01-21 NOTE — Patient Instructions (Signed)
Medication Instructions:  Your physician recommends that you continue on your current medications as directed. Please refer to the Current Medication list given to you today.  Labwork: Today: Lipids & LFTs  Testing/Procedures: None ordered  Follow-Up: Your physician wants you to follow-up in: 1 year with Dr. Aundra Dubin.  You will receive a reminder letter in the mail two months in advance. If you don't receive a letter, please call our office to schedule the follow-up appointment.  If you need a refill on your cardiac medications before your next appointment, please call your pharmacy.  Thank you for choosing [facility]!!

## 2016-01-22 NOTE — Progress Notes (Signed)
Patient ID: Peter Blair, male   DOB: 05/07/1941, 75 y.o.   MRN: [id number] PCP: Dr. Silvio Pate  75 yo with CAD with severe in-stent restenosis in the proximal RCA in 10/11 with placement of DES to the proximal RCA returns for followup.  Last Cardiolite was a normal study done in 11/13.  He has been doing well from a cardiovascular standpoint.  No chest pain, no neck/facial pain (this was prior anginal equivalent).  Mild chronic dyspnea when walking up a hill.  His main problem has been quite significant arthritis pain.  His knee pain has been getting severe and is limiting his mobility.  He has not seen an orthopedist.  Weight is down 11 lbs despite his mobility limitations.   ECG: NSR, LVH  Labs (10/12): LDL 46, HDL 44, K 4.3, creatinine 1.2 Labs (5/13): LDL 50, HDL 41 Labs (8/14): K 3.7, creatinine 1.2 Labs (11/15): LDL 44, HDL 38 Labs (8/16): LDL 58, HDL 38, K 4.3, creatinine 0.94  Allergies (verified):  No Known Drug Allergies   Past Medical History:  1. Hypertension  2. Nephrolithiasis.  3. Hyperlipidemia: Myalgias with Lipitor.  4. Skin cancer, hx of, cell type unknown , Dr Nevada Crane  5. Right shoulder pain, rotator cuff  6. CAD: Exertional chest pain, cath 12/10 showed EF 50-55% with mild inferior hypokinesis, 99% proximal and 60% distal RCA stenosis, 80% proximal stenosis of a moderate-sized D1, 40% proximal LAD. Patient had BMS x 2 placed in the RCA. Unstable angina 10/11 with LHC showing 99% instent restenosis in proximal RCA. Patient had PROMUS DES to the proximal RCA. EF 60% on LV-gram. ETT-Myoview (11/13) with 7'1" exercise, EF 69%, no evidence for ischemia or infarction.  7. Allergic rhinitis 8. ABIs normal in 8/12.  9. Large renal cyst on right.  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 DM, MI at 56, smoker  Mother: breast CA, Gyn CA  Siblings: sister breast CA   Social History:  Retired, Multimedia programmer  Married  Alcohol use-yes:minimally (6 beers /year)   Regular exercise-no  Former Smoker; quit 1982   Current Outpatient Prescriptions  Medication Sig Dispense Refill  . amLODipine (NORVASC) 10 MG tablet TAKE 1 TABLET BY MOUTH DAILY. 90 tablet 2  . aspirin 81 MG tablet Take 81 mg by mouth daily.      . Coenzyme Q10 200 MG TABS Take 1 tablet by mouth daily.   0  . metoprolol tartrate (LOPRESSOR) 25 MG tablet TAKE 1 TABLET BY MOUTH 2 TIMES DAILY. 180 tablet 2  . omeprazole (PRILOSEC) 20 MG capsule Take 20 mg by mouth daily.      . quinapril (ACCUPRIL) 40 MG tablet TAKE 1 TABLET (40 MG TOTAL) BY MOUTH DAILY. 360 tablet 0  . rosuvastatin (CRESTOR) 10 MG tablet Take 1 tablet (10 mg total) by mouth at bedtime. 90 tablet 3  . rosuvastatin (CRESTOR) 10 MG tablet Take 5 mg by mouth daily.    . sildenafil (REVATIO) 20 MG tablet Take 60-100 mg by mouth as directed.      No current facility-administered medications for this visit.    BP 134/70 mmHg  Pulse 70  Ht 5\' 11"  (1.803 m)  Wt 211 lb (95.709 kg)  BMI 29.44 kg/m2 General: NAD, obese.  Neck: No JVD, no thyromegaly or thyroid nodule.  Lungs: Clear to auscultation bilaterally with normal respiratory effort. CV: Nondisplaced PMI.  Heart regular S1/S2, no S3/S4, no murmur.  No edema.  No carotid bruit. 2+ PT pulses  bilaterally.  Abdomen: Soft, nontender, no hepatosplenomegaly, no distention.   Neurologic: Alert and oriented x 3.  Psych: Normal affect. Extremities: No clubbing or cyanosis.   Assessment/Plan:  CAD DES to proximal RCA in-stent restenois in 10/11.   Normal Cardiolite in 11/13. No ischemic symptoms. - Continue metoprolol, ACEI, Crestor, ASA 81.  HYPERLIPIDEMIA Crestor cut back, did not make a difference in pain symptoms.  I suspect that the main problem is his arthritis.  Continue current Crestor 5 mg daily, check lipids/LFTs today.   Hypertension BP is controlled.  Osteoarthritis This is becoming limiting.  I think he is nearing the point where he needs to see an  orthopedist.   Loralie Champagne 01/22/2016

## 2016-04-13 ENCOUNTER — Other Ambulatory Visit: Payer: Self-pay | Admitting: Cardiology

## 2016-05-25 ENCOUNTER — Encounter: Payer: Self-pay | Admitting: Podiatry

## 2016-05-25 ENCOUNTER — Ambulatory Visit (INDEPENDENT_AMBULATORY_CARE_PROVIDER_SITE_OTHER): Payer: Medicare Other | Admitting: Podiatry

## 2016-05-25 ENCOUNTER — Ambulatory Visit (INDEPENDENT_AMBULATORY_CARE_PROVIDER_SITE_OTHER): Payer: Medicare Other

## 2016-05-25 VITALS — BP 144/88 | HR 74 | Resp 16

## 2016-05-25 DIAGNOSIS — M79671 Pain in right foot: Secondary | ICD-10-CM

## 2016-05-25 DIAGNOSIS — M7661 Achilles tendinitis, right leg: Secondary | ICD-10-CM | POA: Diagnosis not present

## 2016-05-25 MED ORDER — TRIAMCINOLONE ACETONIDE 10 MG/ML IJ SUSP
10.0000 mg | Freq: Once | INTRAMUSCULAR | Status: AC
  Administered 2016-05-25: 10 mg

## 2016-05-25 NOTE — Progress Notes (Signed)
   Subjective:    Patient ID: Peter Blair, male    DOB: Jan 17, 1941, 75 y.o.   MRN: [id number]  HPI    Review of Systems  Musculoskeletal: Positive for back pain and arthralgias.  All other systems reviewed and are negative.      Objective:   Physical Exam        Assessment & Plan:

## 2016-05-25 NOTE — Patient Instructions (Signed)

## 2016-05-25 NOTE — Progress Notes (Signed)
Subjective:     Patient ID: Peter Blair, male   DOB: [birth year], 75 y.o.   MRN: [id number]  HPI patient presents stating I had shockwave on my left heel and I developed a lot of pain in the outside of the back of my right heel over the last few months   Review of Systems  All other systems reviewed and are negative.      Objective:   Physical Exam  Constitutional: He is oriented to person, place, and time.  Cardiovascular: Intact distal pulses.   Musculoskeletal: Normal range of motion.  Neurological: He is oriented to person, place, and time.  Skin: Skin is warm.  Nursing note and vitals reviewed.  neurovascular status intact muscle strength adequate range of motion within normal limits with patient's right posterior lateral heel being quite sore with inflammation and fluid buildup with the center and medial side doing well with adequate muscle strength of the Achilles     Assessment:     Inflammatory changes consistent with Achilles tendinitis lateral right    Plan:     H&P x-rays reviewed and condition discussed at great length. I recommended careful injection of the lateral area and if improvement not forthcoming to consider shockwave or other treatments and also advised on physical therapy. Administered 3 mg Dexon some Kenalog 5 mill grams Xylocaine not putting it directly into the tendon and before doing this I did explain chances for rupture associated with injection  Report indicate small spur formation with no indication stress fracture arthritis of the right calcaneus

## 2016-06-09 ENCOUNTER — Encounter: Payer: Self-pay | Admitting: Podiatry

## 2016-06-09 ENCOUNTER — Ambulatory Visit (INDEPENDENT_AMBULATORY_CARE_PROVIDER_SITE_OTHER): Payer: Medicare Other | Admitting: Podiatry

## 2016-06-09 DIAGNOSIS — M7661 Achilles tendinitis, right leg: Secondary | ICD-10-CM | POA: Diagnosis not present

## 2016-06-10 NOTE — Progress Notes (Signed)
Subjective:     Patient ID: Peter Blair, male   DOB: Apr 21, 1941, 75 y.o.   MRN: [id number]  HPI patient states I'm feeling quite a bit better on my right heel and able to walk without pain   Review of Systems     Objective:   Physical Exam Neurovascular status intact muscle strength adequate with diminished posterior discomfort right with inflammation under complete control currently    Assessment:     Achilles tendinitis right improved    Plan:     Dispensed Achilles silicone sheet keep pressure off the heel and advised on physical therapy and if symptoms persist we'll need to consider other treatments

## 2016-08-04 ENCOUNTER — Encounter: Payer: Self-pay | Admitting: Podiatry

## 2016-08-04 ENCOUNTER — Ambulatory Visit (INDEPENDENT_AMBULATORY_CARE_PROVIDER_SITE_OTHER): Payer: Medicare Other | Admitting: Podiatry

## 2016-08-04 DIAGNOSIS — M7661 Achilles tendinitis, right leg: Secondary | ICD-10-CM | POA: Diagnosis not present

## 2016-08-04 MED ORDER — TRIAMCINOLONE ACETONIDE 10 MG/ML IJ SUSP: 10.0000 mg | Freq: Once | INTRAMUSCULAR | Status: DC

## 2016-08-04 NOTE — Progress Notes (Signed)
Subjective:     Patient ID: Peter Blair, male   DOB: 03-Jun-1941, 75 y.o.   MRN: [id number]  HPI patient states I was doing really well but then I developed pain in the back of his heel again and I feel like there is a bump on   Review of Systems     Objective:   Physical Exam Neurovascular status intact muscle strength adequate with inflammation pain in the lateral side of the right Achilles at the insertion localized in nature with no proximal edema erythema noted    Assessment:     Reoccurrence of acute Achilles tendinitis right    Plan:     Advised on ice therapy and I did one more careful injection explaining chances or rupture first 3 mg [place] some [employer] 5 g Xylocaine and advised on reduced activity. Reappoint to recheck

## 2016-08-10 ENCOUNTER — Encounter: Payer: Medicare Other | Admitting: Internal Medicine

## 2016-09-26 ENCOUNTER — Other Ambulatory Visit: Payer: Self-pay | Admitting: Internal Medicine

## 2016-10-10 ENCOUNTER — Encounter: Payer: Self-pay | Admitting: Internal Medicine

## 2016-10-10 ENCOUNTER — Ambulatory Visit (INDEPENDENT_AMBULATORY_CARE_PROVIDER_SITE_OTHER): Payer: Medicare Other | Admitting: Internal Medicine

## 2016-10-10 VITALS — BP 136/80 | HR 70 | Temp 97.8°F | Ht 69.5 in | Wt 211.0 lb

## 2016-10-10 DIAGNOSIS — Z0000 Encounter for general adult medical examination without abnormal findings: Secondary | ICD-10-CM

## 2016-10-10 DIAGNOSIS — R7303 Prediabetes: Secondary | ICD-10-CM | POA: Diagnosis not present

## 2016-10-10 DIAGNOSIS — I10 Essential (primary) hypertension: Secondary | ICD-10-CM | POA: Diagnosis not present

## 2016-10-10 DIAGNOSIS — I2510 Atherosclerotic heart disease of native coronary artery without angina pectoris: Secondary | ICD-10-CM | POA: Diagnosis not present

## 2016-10-10 DIAGNOSIS — K219 Gastro-esophageal reflux disease without esophagitis: Secondary | ICD-10-CM

## 2016-10-10 DIAGNOSIS — R972 Elevated prostate specific antigen [PSA]: Secondary | ICD-10-CM | POA: Diagnosis not present

## 2016-10-10 DIAGNOSIS — Z7189 Other specified counseling: Secondary | ICD-10-CM

## 2016-10-10 LAB — CBC WITH DIFFERENTIAL/PLATELET
BASOS ABS: 0 10*3/uL (ref 0.0–0.1)
BASOS PCT: 0.3 % (ref 0.0–3.0)
Eosinophils Absolute: 0.1 10*3/uL (ref 0.0–0.7)
Eosinophils Relative: 1.9 % (ref 0.0–5.0)
HEMATOCRIT: 46.2 % (ref 39.0–52.0)
Hemoglobin: 15.9 g/dL (ref 13.0–17.0)
LYMPHS PCT: 28.8 % (ref 12.0–46.0)
Lymphs Abs: 2 10*3/uL (ref 0.7–4.0)
MCHC: 34.5 g/dL (ref 30.0–36.0)
MCV: 90.8 fl (ref 78.0–100.0)
MONOS PCT: 8.3 % (ref 3.0–12.0)
Monocytes Absolute: 0.6 10*3/uL (ref 0.1–1.0)
NEUTROS ABS: 4.3 10*3/uL (ref 1.4–7.7)
Neutrophils Relative %: 60.7 % (ref 43.0–77.0)
PLATELETS: 165 10*3/uL (ref 150.0–400.0)
RBC: 5.09 Mil/uL (ref 4.22–5.81)
RDW: 13.2 % (ref 11.5–15.5)
WBC: 7.1 10*3/uL (ref 4.0–10.5)

## 2016-10-10 LAB — RENAL FUNCTION PANEL
Albumin: 4.4 g/dL (ref 3.5–5.2)
BUN: 14 mg/dL (ref 6–23)
CHLORIDE: 104 meq/L (ref 96–112)
CO2: 29 meq/L (ref 19–32)
CREATININE: 1.02 mg/dL (ref 0.40–1.50)
Calcium: 10 mg/dL (ref 8.4–10.5)
GFR: 75.82 mL/min (ref >60.00)
Glucose, Bld: 89 mg/dL (ref 70–99)
PHOSPHORUS: 3.7 mg/dL (ref 2.3–4.6)
Potassium: 4.4 mEq/L (ref 3.5–5.1)
Sodium: 139 mEq/L (ref 135–145)

## 2016-10-10 LAB — HEMOGLOBIN A1C: Hgb A1c MFr Bld: 6.6 % — ABNORMAL HIGH (ref 4.6–6.5)

## 2016-10-10 NOTE — Assessment & Plan Note (Signed)
No symptoms despite weaning the PPI

## 2016-10-10 NOTE — Assessment & Plan Note (Signed)
Will start metformin if still elevated Has lost 10#

## 2016-10-10 NOTE — Progress Notes (Signed)
Subjective:    Patient ID: Peter Blair, male    DOB: 05-06-1941, 75 y.o.   MRN: [id number]  HPI Here for Medicare wellness and follow up of chronic medical conditions Reviewed form and advanced directives Reviewed other doctors No tobacco or alcohol Doesn't exercise---blames his Achilles for this [place] once this year--tripped. No injury No depression or anhedonia Vision is okay--some better. Hearing is fine Independent with instrumental ADLs No memory problems  Has lump on back of right leg Present for at least 2 years May be some bigger lately No pain or drainage  Discussed the elevated sugars from the last time He doesn't remember that Has cut down on bread Weight is down about 10# since last year  No chest pain No SOB No palpitations Will have some dizziness if he bends over (and nausea) when he gets up quickly  Still on the PPI Takes only every other day No dysphagia on reduced dose  Checks BP at home variably Average 134/71 (153/85-- 109/61) Average pulse 56  Takes 1/2 crestor Not sure if his aches are still related to this Does take the coQ 10  No recent kidney stones Does have appt with Dr Jacqlyn Larsen soon PSA elevated recently so checking again (up to 6.3) No trouble voiding  Current Outpatient Prescriptions on File Prior to Visit  Medication Sig Dispense Refill  . amLODipine (NORVASC) 10 MG tablet TAKE 1 TABLET BY MOUTH DAILY. 90 tablet 3  . aspirin 81 MG tablet Take 81 mg by mouth daily.      . Coenzyme Q10 200 MG TABS Take 1 tablet by mouth daily.   0  . metoprolol tartrate (LOPRESSOR) 25 MG tablet TAKE 1 TABLET BY MOUTH 2 TIMES DAILY. 180 tablet 3  . omeprazole (PRILOSEC) 20 MG capsule Take 20 mg by mouth daily.      . quinapril (ACCUPRIL) 40 MG tablet TAKE 1 TABLET (40 MG TOTAL) BY MOUTH DAILY. 360 tablet 0  . rosuvastatin (CRESTOR) 10 MG tablet TAKE 1 TABLET AT BEDTIME (Patient taking differently: TAKE 0.5 TABLET AT BEDTIME) 90 tablet 3  .  sildenafil (REVATIO) 20 MG tablet Take 60-100 mg by mouth as directed.      Current Facility-Administered Medications on File Prior to Visit  Medication Dose Route Frequency Provider Last Rate Last Dose  . triamcinolone acetonide (KENALOG) 10 MG/ML injection 10 mg  10 mg Other Once Wallene Huh, DPM        Allergies  Allergen Reactions  . Iodinated Diagnostic Agents     Other reaction(s): NAUSEA  . Iohexol     Itchy eyes, dry mouth in early 1990s during imaging for renal calculi    Past Medical History:  Diagnosis Date  . Coronary artery disease    exertional chest pain, cath 12/10 showed EF 50-55% with mild inferior hypokensis, 99% proximal and 60% distal RCA stenosis, 80% prox stenosis of a moderate sized D1, 40% prox LAD. Patient had BMS x2 placed in RCA. Unstable Angina. PROMUS DES to the prox RCA  . Erectile dysfunction   . GERD (gastroesophageal reflux disease)   . Hyperlipidemia   . Hypertension   . Nephrolithiasis    Dr. Jacqlyn Larsen  . Renal cyst   . Right shoulder pain    rotator cuff  . Skin cancer    Skin cancer, PMH of, cell type unknown, Dr. Nevada Crane    Past Surgical History:  Procedure Laterality Date  . colonoscopy with ploypectomy  03/2012  Dr Sharlett Iles, GI  . CORONARY ANGIOPLASTY WITH STENT PLACEMENT     X 3 total stents  . cystoscopic removal  04/2010  . Flexibe sigmoidoscopy   2001  . LITHOTRIPSY     x 1    Family History  Problem Relation Age of Onset  . Breast cancer Mother   . Cervical cancer Mother   . Diabetes Father   . Heart attack Father 63  . Breast cancer Sister   . Colon cancer Sister     mets to liver & lung  . Stroke Neg Hx     Social History   Social History  . Marital status: Married    Spouse name: N/A  . Number of children: 0  . Years of education: N/A   Occupational History  . Telephone technician     Retired   Social History Main Topics  . Smoking status: Former Smoker    Quit date: 12/05/1970  . Smokeless tobacco:  Never Used     Comment: smoked 1961-1972, up to 1 ppd  . Alcohol use Yes     Comment: minimally, 6 beers/year  . Drug use: No  . Sexual activity: Not on file   Other Topics Concern  . Not on file   Social History Narrative   Has living will    Wife is health care POA   Would accept resuscitation attempts   No extended tube feedings if cognitively unaware   Review of Systems Has typical "aches and pains" Ongoing Achilles tendonitis--shots and brace haven't helped Appetite is fine Sleeps fairly well--rare problems Bowels are okay--no blood in stool No rash or suspicious lesions (sees dermatologist in [place])     Objective:   Physical Exam  Constitutional: He is oriented to person, place, and time. He appears well-developed and well-nourished. No distress.  HENT:  Mouth/Throat: Oropharynx is clear and moist. No oropharyngeal exudate.  Neck: Normal range of motion. Neck supple. No thyromegaly present.  Cardiovascular: Normal rate, regular rhythm, normal heart sounds and intact distal pulses.  Exam reveals no gallop.   No murmur heard. Pulmonary/Chest: Effort normal and breath sounds normal. No respiratory distress. He has no wheezes. He has no rales.  Abdominal: Soft. There is no tenderness.  Musculoskeletal: He exhibits no edema or tenderness.  1.5 cm firm well circumscribed mass in posterior mid right calf--looks like cyst Not inflamed or tender Not clearly moving with the muscle (discussed showing it to the dermatologist)  Lymphadenopathy:    He has no cervical adenopathy.  Neurological: He is alert and oriented to person, place, and time.  President-- "Trump, Obama, Bush" [id number] D-l-r-o-w Recall 3/3  Skin: No rash noted. No erythema.  Psychiatric: He has a normal mood and affect. His behavior is normal.          Assessment & Plan:

## 2016-10-10 NOTE — Patient Instructions (Signed)
DASH Eating Plan  DASH stands for "Dietary Approaches to Stop Hypertension." The DASH eating plan is a healthy eating plan that has been shown to reduce high blood pressure (hypertension). Additional health benefits may include reducing the risk of type 2 diabetes mellitus, heart disease, and stroke. The DASH eating plan may also help with weight loss.  WHAT DO I NEED TO KNOW ABOUT THE DASH EATING PLAN?  For the DASH eating plan, you will follow these general guidelines:  · Choose foods with a percent daily value for sodium of less than 5% (as listed on the food label).  · Use salt-free seasonings or herbs instead of table salt or sea salt.  · Check with your health care provider or pharmacist before using salt substitutes.  · Eat lower-sodium products, often labeled as "lower sodium" or "no salt added."  · Eat fresh foods.  · Eat more vegetables, fruits, and low-fat dairy products.  · Choose whole grains. Look for the word "whole" as the first word in the ingredient list.  · Choose fish and skinless chicken or turkey more often than red meat. Limit fish, poultry, and meat to 6 oz (170 g) each day.  · Limit sweets, desserts, sugars, and sugary drinks.  · Choose heart-healthy fats.  · Limit cheese to 1 oz (28 g) per day.  · Eat more home-cooked food and less restaurant, buffet, and fast food.  · Limit fried foods.  · Cook foods using methods other than frying.  · Limit canned vegetables. If you do use them, rinse them well to decrease the sodium.  · When eating at a restaurant, ask that your food be prepared with less salt, or no salt if possible.  WHAT FOODS CAN I EAT?  Seek help from a dietitian for individual calorie needs.  Grains  Whole grain or whole wheat bread. Brown rice. Whole grain or whole wheat pasta. Quinoa, bulgur, and whole grain cereals. Low-sodium cereals. Corn or whole wheat flour tortillas. Whole grain cornbread. Whole grain crackers. Low-sodium crackers.  Vegetables  Fresh or frozen vegetables  (raw, steamed, roasted, or grilled). Low-sodium or reduced-sodium tomato and vegetable juices. Low-sodium or reduced-sodium tomato sauce and paste. Low-sodium or reduced-sodium canned vegetables.   Fruits  All fresh, canned (in natural juice), or frozen fruits.  Meat and Other Protein Products  Ground beef (85% or leaner), grass-fed beef, or beef trimmed of fat. Skinless chicken or turkey. Ground chicken or turkey. Pork trimmed of fat. All fish and seafood. Eggs. Dried beans, peas, or lentils. Unsalted nuts and seeds. Unsalted canned beans.  Dairy  Low-fat dairy products, such as skim or 1% milk, 2% or reduced-fat cheeses, low-fat ricotta or cottage cheese, or plain low-fat yogurt. Low-sodium or reduced-sodium cheeses.  Fats and Oils  Tub margarines without trans fats. Light or reduced-fat mayonnaise and salad dressings (reduced sodium). Avocado. Safflower, olive, or canola oils. Natural peanut or almond butter.  Other  Unsalted popcorn and pretzels.  The items listed above may not be a complete list of recommended foods or beverages. Contact your dietitian for more options.  WHAT FOODS ARE NOT RECOMMENDED?  Grains  White bread. White pasta. White rice. Refined cornbread. Bagels and croissants. Crackers that contain trans fat.  Vegetables  Creamed or fried vegetables. Vegetables in a cheese sauce. Regular canned vegetables. Regular canned tomato sauce and paste. Regular tomato and vegetable juices.  Fruits  Dried fruits. Canned fruit in light or heavy syrup. Fruit juice.  Meat and Other Protein   Products  Fatty cuts of meat. Ribs, chicken wings, bacon, sausage, bologna, salami, chitterlings, fatback, hot dogs, bratwurst, and packaged luncheon meats. Salted nuts and seeds. Canned beans with salt.  Dairy  Whole or 2% milk, cream, half-and-half, and cream cheese. Whole-fat or sweetened yogurt. Full-fat cheeses or blue cheese. Nondairy creamers and whipped toppings. Processed cheese, cheese spreads, or cheese  curds.  Condiments  Onion and garlic salt, seasoned salt, table salt, and sea salt. Canned and packaged gravies. Worcestershire sauce. Tartar sauce. Barbecue sauce. Teriyaki sauce. Soy sauce, including reduced sodium. Steak sauce. Fish sauce. Oyster sauce. Cocktail sauce. Horseradish. Ketchup and mustard. Meat flavorings and tenderizers. Bouillon cubes. Hot sauce. Tabasco sauce. Marinades. Taco seasonings. Relishes.  Fats and Oils  Butter, stick margarine, lard, shortening, ghee, and bacon fat. Coconut, palm kernel, or palm oils. Regular salad dressings.  Other  Pickles and olives. Salted popcorn and pretzels.  The items listed above may not be a complete list of foods and beverages to avoid. Contact your dietitian for more information.  WHERE CAN I FIND MORE INFORMATION?  National Heart, Lung, and Blood Institute: www.nhlbi.nih.gov/health/health-topics/topics/dash/     This information is not intended to replace advice given to you by your health care provider. Make sure you discuss any questions you have with your health care provider.     Document Released: 11/10/2011 Document Revised: 12/12/2014 Document Reviewed: 09/25/2013  Elsevier Interactive Patient Education ©2016 Else[employer] Inc[employer]

## 2016-10-10 NOTE — Assessment & Plan Note (Signed)
See social history 

## 2016-10-10 NOTE — Assessment & Plan Note (Signed)
Has follow up with Dr Jacqlyn Larsen

## 2016-10-10 NOTE — Assessment & Plan Note (Signed)
I have personally reviewed the Medicare Annual Wellness questionnaire and have noted 1. The patient's medical and social history 2. Their use of alcohol, tobacco or illicit drugs 3. Their current medications and supplements 4. The patient's functional ability including ADL's, fall risks, home safety risks and hearing or visual             impairment. 5. Diet and physical activities 6. Evidence for depression or mood disorders  The patients weight, height, BMI and visual acuity have been recorded in the chart I have made referrals, counseling and provided education to the patient based review of the above and I have provided the pt with a written personalized care plan for preventive services.  I have provided you with a copy of your personalized plan for preventive services. Please take the time to review along with your updated medication list.  UTD on imms Recent PSA Colon not due for some years

## 2016-10-10 NOTE — Assessment & Plan Note (Signed)
No angina Limited activity due to Achilles Sees cardiologist

## 2016-10-10 NOTE — Assessment & Plan Note (Signed)
BP Readings from Last 3 Encounters:  10/10/16 136/80  05/25/16 (!) 144/88  01/21/16 134/70   Better today No changes

## 2016-10-10 NOTE — Progress Notes (Signed)
Pre visit review using our clinic review tool, if applicable. No additional management support is needed unless otherwise documented below in the visit note. 

## 2016-12-01 ENCOUNTER — Other Ambulatory Visit: Payer: Self-pay | Admitting: Internal Medicine

## 2017-01-12 ENCOUNTER — Encounter: Payer: Self-pay | Admitting: Gastroenterology

## 2017-09-19 ENCOUNTER — Other Ambulatory Visit: Payer: Self-pay | Admitting: Internal Medicine

## 2017-10-11 ENCOUNTER — Ambulatory Visit (INDEPENDENT_AMBULATORY_CARE_PROVIDER_SITE_OTHER): Payer: Medicare Other | Admitting: Internal Medicine

## 2017-10-11 ENCOUNTER — Encounter: Payer: Self-pay | Admitting: Internal Medicine

## 2017-10-11 VITALS — BP 122/76 | HR 66 | Temp 97.8°F | Ht 69.5 in | Wt 216.5 lb

## 2017-10-11 DIAGNOSIS — Z7189 Other specified counseling: Secondary | ICD-10-CM | POA: Diagnosis not present

## 2017-10-11 DIAGNOSIS — Z0000 Encounter for general adult medical examination without abnormal findings: Secondary | ICD-10-CM

## 2017-10-11 DIAGNOSIS — I2510 Atherosclerotic heart disease of native coronary artery without angina pectoris: Secondary | ICD-10-CM | POA: Diagnosis not present

## 2017-10-11 DIAGNOSIS — K219 Gastro-esophageal reflux disease without esophagitis: Secondary | ICD-10-CM | POA: Diagnosis not present

## 2017-10-11 DIAGNOSIS — M150 Primary generalized (osteo)arthritis: Secondary | ICD-10-CM

## 2017-10-11 DIAGNOSIS — I10 Essential (primary) hypertension: Secondary | ICD-10-CM

## 2017-10-11 DIAGNOSIS — M159 Polyosteoarthritis, unspecified: Secondary | ICD-10-CM | POA: Insufficient documentation

## 2017-10-11 LAB — COMPREHENSIVE METABOLIC PANEL
ALBUMIN: 4.2 g/dL (ref 3.5–5.2)
ALT: 18 U/L (ref 0–53)
AST: 14 U/L (ref 0–37)
Alkaline Phosphatase: 64 U/L (ref 39–117)
BILIRUBIN TOTAL: 0.6 mg/dL (ref 0.2–1.2)
BUN: 13 mg/dL (ref 6–23)
CALCIUM: 9.7 mg/dL (ref 8.4–10.5)
CO2: 30 mEq/L (ref 19–32)
CREATININE: 1.07 mg/dL (ref 0.40–1.50)
Chloride: 105 mEq/L (ref 96–112)
GFR: 71.55 mL/min (ref >60.00)
Glucose, Bld: 181 mg/dL — ABNORMAL HIGH (ref 70–99)
Potassium: 5.1 mEq/L (ref 3.5–5.1)
Sodium: 139 mEq/L (ref 135–145)
Total Protein: 7.2 g/dL (ref 6.0–8.3)

## 2017-10-11 LAB — CBC
HCT: 46.9 % (ref 39.0–52.0)
Hemoglobin: 15.6 g/dL (ref 13.0–17.0)
MCHC: 33.3 g/dL (ref 30.0–36.0)
MCV: 92.8 fl (ref 78.0–100.0)
PLATELETS: 153 10*3/uL (ref 150.0–400.0)
RBC: 5.05 Mil/uL (ref 4.22–5.81)
RDW: 13.2 % (ref 11.5–15.5)
WBC: 5.5 10*3/uL (ref 4.0–10.5)

## 2017-10-11 LAB — LIPID PANEL
CHOL/HDL RATIO: 3
CHOLESTEROL: 120 mg/dL (ref 0–200)
HDL: 37.1 mg/dL — ABNORMAL LOW (ref >39.00)
LDL Cholesterol: 61 mg/dL (ref 0–99)
NonHDL: 82.64
TRIGLYCERIDES: 107 mg/dL (ref 0.0–149.0)
VLDL: 21.4 mg/dL (ref 0.0–40.0)

## 2017-10-11 MED ORDER — ROSUVASTATIN CALCIUM 10 MG PO TABS: 10.0000 mg | ORAL_TABLET | Freq: Every day | ORAL | 3 refills | Status: DC

## 2017-10-11 NOTE — Progress Notes (Signed)
Subjective:    Patient ID: Peter Blair, male    DOB: 13-May-1941, 76 y.o.   MRN: [id number]  HPI Here for Medicare wellness visit and follow up of chronic health conditions Reviewed form and advanced directives Reviewed other doctors No alcohol or tobacco Still not really exercising Fell once---jammed knee some Vision is okay---needs to go back Hearing is fine No depression or anhedonia  (but 2 damaged places at [employer]) Independent with instrumental ADLs No trouble with memory  He notes some aching and knee pain Thinks the flexogenics helps some---injections Fell and jammed left knee after this--so hard to tell Still with Achilles tendonitis--- hasn't been back to podiatrist. This really limits his walking Has knot on back of heel  Monitors his BP Average at home 137/72 No SOB. Occasional vague chest pain ---usually at rest (?after lifting or twists wrong) Gets dizzy if he bends over---no syncope No edema--but is stiff in ankles No palpitations  Keeps up with Dr Jacqlyn Larsen PSA stable --discussed considering stopping these Voids okay but flow remains slow. Empties okay Nocturia is usually only once Right kidney cyst appears stable Satisfied with sildenafil  Continues on omeprazole Tries to go every other day Occasional heartburn with spicy foods No dysphagia  Current Outpatient Medications on File Prior to Visit  Medication Sig Dispense Refill  . amLODipine (NORVASC) 10 MG tablet TAKE 1 TABLET BY MOUTH DAILY. 90 tablet 2  . aspirin 81 MG tablet Take 81 mg by mouth daily.      . Coenzyme Q10 200 MG TABS Take 1 tablet by mouth daily.   0  . metoprolol tartrate (LOPRESSOR) 25 MG tablet TAKE 1 TABLET BY MOUTH 2 TIMES DAILY. 180 tablet 2  . omeprazole (PRILOSEC) 20 MG capsule Take 20 mg by mouth daily.      . quinapril (ACCUPRIL) 40 MG tablet TAKE 1 TABLET (40 MG TOTAL) BY MOUTH DAILY 360 tablet 0  . rosuvastatin (CRESTOR) 10 MG tablet TAKE 1 TABLET AT BEDTIME (Patient  taking differently: TAKE 0.5 TABLET AT BEDTIME) 90 tablet 3  . sildenafil (REVATIO) 20 MG tablet Take 60-100 mg by mouth as directed.      Current Facility-Administered Medications on File Prior to Visit  Medication Dose Route Frequency Provider Last Rate Last Dose  . triamcinolone acetonide (KENALOG) 10 MG/ML injection 10 mg  10 mg Other Once Wallene Huh, DPM        Allergies  Allergen Reactions  . Iodinated Diagnostic Agents     Other reaction(s): NAUSEA  . Iohexol     Itchy eyes, dry mouth in early 1990s during imaging for renal calculi    Past Medical History:  Diagnosis Date  . Coronary artery disease    exertional chest pain, cath 12/10 showed EF 50-55% with mild inferior hypokensis, 99% proximal and 60% distal RCA stenosis, 80% prox stenosis of a moderate sized D1, 40% prox LAD. Patient had BMS x2 placed in RCA. Unstable Angina. PROMUS DES to the prox RCA  . Erectile dysfunction   . GERD (gastroesophageal reflux disease)   . Hyperlipidemia   . Hypertension   . Nephrolithiasis    Dr. Jacqlyn Larsen  . Renal cyst   . Right shoulder pain    rotator cuff  . Skin cancer    Skin cancer, PMH of, cell type unknown, Dr. [place] Crane    Past Surgical History:  Procedure Laterality Date  . colonoscopy with ploypectomy  03/2012    Dr Sharlett Iles, GI  .  CORONARY ANGIOPLASTY WITH STENT PLACEMENT     X 3 total stents  . cystoscopic removal  04/2010  . Flexibe sigmoidoscopy   2001  . LITHOTRIPSY     x 1    Family History  Problem Relation Age of Onset  . Breast cancer Mother   . Cervical cancer Mother   . Diabetes Father   . Heart attack Father 77  . Breast cancer Sister   . Colon cancer Sister        mets to liver & lung  . Stroke Neg Hx     Social History   Socioeconomic History  . Marital status: Married    Spouse name: Not on file  . Number of children: 0  . Years of education: Not on file  . Highest education level: Not on file  Social Needs  . Financial resource strain:  Not on file  . Food insecurity - worry: Not on file  . Food insecurity - inability: Not on file  . Transportation needs - medical: Not on file  . Transportation needs - non-medical: Not on file  Occupational History  . Occupation: Occupational hygienist    Comment: Retired  Tobacco Use  . Smoking status: Former Smoker    Last attempt to quit: 12/05/1970    Years since quitting: 46.8  . Smokeless tobacco: Never Used  . Tobacco comment: smoked 1961-1972, up to 1 ppd  Substance and Sexual Activity  . Alcohol use: Yes    Comment: minimally, 6 beers/year  . Drug use: No  . Sexual activity: Not on file  Other Topics Concern  . Not on file  Social History Narrative   Has living will    Wife is health care POA-- no alternate set up   Would accept resuscitation attempts   No extended tube feedings if cognitively unaware   Review of Systems Appetite is fine Weight up slightly Wears seat belt Sleeps okay Teeth are good--- keeps up with dentist Hasn't been to dermatologist lately---- no suspicious lesions Bowels are fine. No blood No headaches     Objective:   Physical Exam  Constitutional: He is oriented to person, place, and time. He appears well-developed. No distress.  HENT:  Mouth/Throat: Oropharynx is clear and moist. No oropharyngeal exudate.  Neck: No thyromegaly present.  Cardiovascular: Normal rate, regular rhythm, normal heart sounds and intact distal pulses. Exam reveals no gallop.  No murmur heard. Pulmonary/Chest: Effort normal and breath sounds normal. No respiratory distress. He has no wheezes. He has no rales.  Abdominal: Soft. He exhibits no distension. There is no tenderness. There is no rebound and no guarding.  Musculoskeletal: He exhibits no edema or tenderness.  Lymphadenopathy:    He has no cervical adenopathy.  Neurological: He is alert and oriented to person, place, and time.  President--- "Dwaine Deter, Bush" [phone] D-l-r-o-w Recall 3/3    Skin: No rash noted. No erythema.  Psychiatric: He has a normal mood and affect. His behavior is normal.          Assessment & Plan:

## 2017-10-11 NOTE — Assessment & Plan Note (Signed)
Discussed trying to find some aerobic work he can do---and definitely resistance exercise

## 2017-10-11 NOTE — Assessment & Plan Note (Signed)
BP Readings from Last 3 Encounters:  10/11/17 122/76  10/10/16 136/80  05/25/16 (!) 144/88   Good control Due for labs

## 2017-10-11 NOTE — Assessment & Plan Note (Addendum)
No apparent angina but very sedentary EKG---unchanged from 01/21/16 Sinus at 63. Normal intervals. LAD. Voltage criteria for LVH. No ischemic changes

## 2017-10-11 NOTE — Assessment & Plan Note (Signed)
I have personally reviewed the Medicare Annual Wellness questionnaire and have noted 1. The patient's medical and social history 2. Their use of alcohol, tobacco or illicit drugs 3. Their current medications and supplements 4. The patient's functional ability including ADL's, fall risks, home safety risks and hearing or visual             impairment. 5. Diet and physical activities 6. Evidence for depression or mood disorders  The patients weight, height, BMI and visual acuity have been recorded in the chart I have made referrals, counseling and provided education to the patient based review of the above and I have provided the pt with a written personalized care plan for preventive services.  I have provided you with a copy of your personalized plan for preventive services. Please take the time to review along with your updated medication list.  Had flu vaccine Discussed shingrix Discussed lifestyle-- DASH info given Recommended stopping PSA testing No more colon--benign in 2013

## 2017-10-11 NOTE — Assessment & Plan Note (Signed)
See social history 

## 2017-10-11 NOTE — Patient Instructions (Signed)
DASH Eating Plan DASH stands for "Dietary Approaches to Stop Hypertension." The DASH eating plan is a healthy eating plan that has been shown to reduce high blood pressure (hypertension). It may also reduce your risk for type 2 diabetes, heart disease, and stroke. The DASH eating plan may also help with weight loss. What are tips for following this plan? General guidelines  Avoid eating more than 2,300 mg (milligrams) of salt (sodium) a day. If you have hypertension, you may need to reduce your sodium intake to 1,500 mg a day.  Limit alcohol intake to no more than 1 drink a day for nonpregnant women and 2 drinks a day for men. One drink equals 12 oz of beer, 5 oz of wine, or 1 oz of hard liquor.  Work with your health care provider to maintain a healthy body weight or to lose weight. Ask what an ideal weight is for you.  Get at least 30 minutes of exercise that causes your heart to beat faster (aerobic exercise) most days of the week. Activities may include walking, swimming, or biking.  Work with your health care provider or diet and nutrition specialist (dietitian) to adjust your eating plan to your individual calorie needs. Reading food labels  Check food labels for the amount of sodium per serving. Choose foods with less than 5 percent of the Daily Value of sodium. Generally, foods with less than 300 mg of sodium per serving fit into this eating plan.  To find whole grains, look for the word "whole" as the first word in the ingredient list. Shopping  Buy products labeled as "low-sodium" or "no salt added."  Buy fresh foods. Avoid canned foods and premade or frozen meals. Cooking  Avoid adding salt when cooking. Use salt-free seasonings or herbs instead of table salt or sea salt. Check with your health care provider or pharmacist before using salt substitutes.  Do not fry foods. Cook foods using healthy methods such as baking, boiling, grilling, and broiling instead.  Cook with  heart-healthy oils, such as olive, canola, soybean, or sunflower oil. Meal planning   Eat a balanced diet that includes: ? 5 or more servings of fruits and vegetables each day. At each meal, try to fill half of your plate with fruits and vegetables. ? Up to 6-8 servings of whole grains each day. ? Less than 6 oz of lean meat, poultry, or fish each day. A 3-oz serving of meat is about the same size as a deck of cards. One egg equals 1 oz. ? 2 servings of low-fat dairy each day. ? A serving of nuts, seeds, or beans 5 times each week. ? Heart-healthy fats. Healthy fats called Omega-3 fatty acids are found in foods such as flaxseeds and coldwater fish, like sardines, salmon, and mackerel.  Limit how much you eat of the following: ? Canned or prepackaged foods. ? Food that is high in trans fat, such as fried foods. ? Food that is high in saturated fat, such as fatty meat. ? Sweets, desserts, sugary drinks, and other foods with added sugar. ? Full-fat dairy products.  Do not salt foods before eating.  Try to eat at least 2 vegetarian meals each week.  Eat more home-cooked food and less restaurant, buffet, and fast food.  When eating at a restaurant, ask that your food be prepared with less salt or no salt, if possible. What foods are recommended? The items listed may not be a complete list. Talk with your dietitian about what   dietary choices are best for you. Grains Whole-grain or whole-wheat bread. Whole-grain or whole-wheat pasta. Brown rice. Oatmeal. Quinoa. Bulgur. Whole-grain and low-sodium cereals. Pita bread. Low-fat, low-sodium crackers. Whole-wheat flour tortillas. Vegetables Fresh or frozen vegetables (raw, steamed, roasted, or grilled). Low-sodium or reduced-sodium tomato and vegetable juice. Low-sodium or reduced-sodium tomato sauce and tomato paste. Low-sodium or reduced-sodium canned vegetables. Fruits All fresh, dried, or frozen fruit. Canned fruit in natural juice (without  added sugar). Meat and other protein foods Skinless chicken or turkey. Ground chicken or turkey. Pork with fat trimmed off. Fish and seafood. Egg whites. Dried beans, peas, or lentils. Unsalted nuts, nut butters, and seeds. Unsalted canned beans. Lean cuts of beef with fat trimmed off. Low-sodium, lean deli meat. Dairy Low-fat (1%) or fat-free (skim) milk. Fat-free, low-fat, or reduced-fat cheeses. Nonfat, low-sodium ricotta or cottage cheese. Low-fat or nonfat yogurt. Low-fat, low-sodium cheese. Fats and oils Soft margarine without trans fats. Vegetable oil. Low-fat, reduced-fat, or light mayonnaise and salad dressings (reduced-sodium). Canola, safflower, olive, soybean, and sunflower oils. Avocado. Seasoning and other foods Herbs. Spices. Seasoning mixes without salt. Unsalted popcorn and pretzels. Fat-free sweets. What foods are not recommended? The items listed may not be a complete list. Talk with your dietitian about what dietary choices are best for you. Grains Baked goods made with fat, such as croissants, muffins, or some breads. Dry pasta or rice meal packs. Vegetables Creamed or fried vegetables. Vegetables in a cheese sauce. Regular canned vegetables (not low-sodium or reduced-sodium). Regular canned tomato sauce and paste (not low-sodium or reduced-sodium). Regular tomato and vegetable juice (not low-sodium or reduced-sodium). Pickles. Olives. Fruits Canned fruit in a light or heavy syrup. Fried fruit. Fruit in cream or butter sauce. Meat and other protein foods Fatty cuts of meat. Ribs. Fried meat. Bacon. Sausage. Bologna and other processed lunch meats. Salami. Fatback. Hotdogs. Bratwurst. Salted nuts and seeds. Canned beans with added salt. Canned or smoked fish. Whole eggs or egg yolks. Chicken or turkey with skin. Dairy Whole or 2% milk, cream, and half-and-half. Whole or full-fat cream cheese. Whole-fat or sweetened yogurt. Full-fat cheese. Nondairy creamers. Whipped toppings.  Processed cheese and cheese spreads. Fats and oils Butter. Stick margarine. Lard. Shortening. Ghee. Bacon fat. Tropical oils, such as coconut, palm kernel, or palm oil. Seasoning and other foods Salted popcorn and pretzels. Onion salt, garlic salt, seasoned salt, table salt, and sea salt. Worcestershire sauce. Tartar sauce. Barbecue sauce. Teriyaki sauce. Soy sauce, including reduced-sodium. Steak sauce. Canned and packaged gravies. Fish sauce. Oyster sauce. Cocktail sauce. Horseradish that you find on the shelf. Ketchup. Mustard. Meat flavorings and tenderizers. Bouillon cubes. Hot sauce and Tabasco sauce. Premade or packaged marinades. Premade or packaged taco seasonings. Relishes. Regular salad dressings. Where to find more information:  National Heart, Lung, and Blood Institute: www.nhlbi.nih.gov  American Heart Association: www.heart.org Summary  The DASH eating plan is a healthy eating plan that has been shown to reduce high blood pressure (hypertension). It may also reduce your risk for type 2 diabetes, heart disease, and stroke.  With the DASH eating plan, you should limit salt (sodium) intake to 2,300 mg a day. If you have hypertension, you may need to reduce your sodium intake to 1,500 mg a day.  When on the DASH eating plan, aim to eat more fresh fruits and vegetables, whole grains, lean proteins, low-fat dairy, and heart-healthy fats.  Work with your health care provider or diet and nutrition specialist (dietitian) to adjust your eating plan to your individual   calorie needs. This information is not intended to replace advice given to you by your health care provider. Make sure you discuss any questions you have with your health care provider. Document Released: 11/10/2011 Document Revised: 11/14/2016 Document Reviewed: 11/14/2016 Elsevier Interactive Patient Education  2017 Elsevier Inc.  

## 2017-10-11 NOTE — Assessment & Plan Note (Signed)
Generally okay on every other day PPI

## 2017-10-16 ENCOUNTER — Other Ambulatory Visit: Payer: Self-pay | Admitting: Internal Medicine

## 2017-10-16 DIAGNOSIS — R7301 Impaired fasting glucose: Secondary | ICD-10-CM

## 2017-11-20 ENCOUNTER — Other Ambulatory Visit: Payer: Self-pay | Admitting: Internal Medicine

## 2017-11-21 ENCOUNTER — Other Ambulatory Visit (INDEPENDENT_AMBULATORY_CARE_PROVIDER_SITE_OTHER): Payer: Medicare Other

## 2017-11-21 DIAGNOSIS — R7301 Impaired fasting glucose: Secondary | ICD-10-CM | POA: Diagnosis not present

## 2017-11-21 LAB — GLUCOSE, RANDOM: GLUCOSE: 148 mg/dL — AB (ref 70–99)

## 2017-11-21 LAB — HEMOGLOBIN A1C: HEMOGLOBIN A1C: 7.6 % — AB (ref 4.6–6.5)

## 2017-12-11 ENCOUNTER — Ambulatory Visit (INDEPENDENT_AMBULATORY_CARE_PROVIDER_SITE_OTHER): Payer: Medicare Other | Admitting: Internal Medicine

## 2017-12-11 ENCOUNTER — Encounter: Payer: Self-pay | Admitting: Internal Medicine

## 2017-12-11 VITALS — BP 130/84 | HR 70 | Temp 97.9°F | Wt 211.0 lb

## 2017-12-11 DIAGNOSIS — E119 Type 2 diabetes mellitus without complications: Secondary | ICD-10-CM

## 2017-12-11 DIAGNOSIS — E1159 Type 2 diabetes mellitus with other circulatory complications: Secondary | ICD-10-CM | POA: Insufficient documentation

## 2017-12-11 MED ORDER — METFORMIN HCL 500 MG PO TABS: 500.0000 mg | ORAL_TABLET | Freq: Two times a day (BID) | ORAL | 3 refills | Status: DC

## 2017-12-11 NOTE — Patient Instructions (Signed)
Let me know if you have any indigestion or diarrhea with the metformin. Start with just one a day with breakfast for the first few days, and then increase to twice a day (with breakfast and dinner).

## 2017-12-11 NOTE — Progress Notes (Signed)
Subjective:    Patient ID: Peter Blair, male    DOB: [birth year], 77 y.o.   MRN: [id number]  HPI Here for review of new diagnosis of diabetes  Has tried to be more careful and has lost a few pounds Has cut out sugared beverages for some time Watching bread, etc  Recognizes he needs to improve exercise Limited by aches and pains Tries to walk some--but still limited  Current Outpatient Medications on File Prior to Visit  Medication Sig Dispense Refill  . amLODipine (NORVASC) 10 MG tablet TAKE 1 TABLET BY MOUTH DAILY. 90 tablet 2  . aspirin 81 MG tablet Take 81 mg by mouth daily.      . Coenzyme Q10 200 MG TABS Take 1 tablet by mouth daily.   0  . metoprolol tartrate (LOPRESSOR) 25 MG tablet TAKE 1 TABLET BY MOUTH 2 TIMES DAILY. 180 tablet 2  . omeprazole (PRILOSEC) 20 MG capsule Take 20 mg by mouth daily.      . quinapril (ACCUPRIL) 40 MG tablet TAKE 1 TABLET (40 MG TOTAL) BY MOUTH DAILY 365 tablet 0  . rosuvastatin (CRESTOR) 10 MG tablet Take 1 tablet (10 mg total) at bedtime by mouth. 90 tablet 3  . sildenafil (REVATIO) 20 MG tablet Take 60-100 mg by mouth as directed.      No current facility-administered medications on file prior to visit.     Allergies  Allergen Reactions  . Iodinated Diagnostic Agents     Other reaction(s): NAUSEA  . Iohexol     Itchy eyes, dry mouth in early 1990s during imaging for renal calculi    Past Medical History:  Diagnosis Date  . Coronary artery disease    exertional chest pain, cath 12/10 showed EF 50-55% with mild inferior hypokensis, 99% proximal and 60% distal RCA stenosis, 80% prox stenosis of a moderate sized D1, 40% prox LAD. Patient had BMS x2 placed in RCA. Unstable Angina. PROMUS DES to the prox RCA  . Erectile dysfunction   . GERD (gastroesophageal reflux disease)   . Hyperlipidemia   . Hypertension   . Nephrolithiasis    Dr. Jacqlyn Larsen  . Renal cyst   . Right shoulder pain    rotator cuff  . Skin cancer    Skin cancer, PMH  of, cell type unknown, Dr. Nevada Crane    Past Surgical History:  Procedure Laterality Date  . colonoscopy with ploypectomy  03/2012    Dr Sharlett Iles, GI  . CORONARY ANGIOPLASTY WITH STENT PLACEMENT     X 3 total stents  . cystoscopic removal  04/2010  . Flexibe sigmoidoscopy   2001  . LITHOTRIPSY     x 1    Family History  Problem Relation Age of Onset  . Breast cancer Mother   . Cervical cancer Mother   . Diabetes Father   . Heart attack Father 42  . Breast cancer Sister   . Colon cancer Sister        mets to liver & lung  . Stroke Neg Hx     Social History   Socioeconomic History  . Marital status: Married    Spouse name: Not on file  . Number of children: 0  . Years of education: Not on file  . Highest education level: Not on file  Social Needs  . Financial resource strain: Not on file  . Food insecurity - worry: Not on file  . Food insecurity - inability: Not on file  . Transportation needs -  medical: Not on file  . Transportation needs - non-medical: Not on file  Occupational History  . Occupation: Occupational hygienist    Comment: Retired  Tobacco Use  . Smoking status: Former Smoker    Last attempt to quit: 12/05/1970    Years since quitting: 47.0  . Smokeless tobacco: Never Used  . Tobacco comment: smoked 1961-1972, up to 1 ppd  Substance and Sexual Activity  . Alcohol use: Yes    Comment: minimally, 6 beers/year  . Drug use: No  . Sexual activity: Not on file  Other Topics Concern  . Not on file  Social History Narrative   Has living will    Wife is health care POA-- no alternate set up   Would accept resuscitation attempts   No extended tube feedings if cognitively unaware   Review of Systems  Sleeps well usually Weight is down 5#     Objective:   Physical Exam  Constitutional: No distress.  Psychiatric: He has a normal mood and affect. His behavior is normal.          Assessment & Plan:

## 2017-12-11 NOTE — Assessment & Plan Note (Signed)
Counseled all of 15 minute visit Reluctant about exercise--- discussed Will start metformin--discussed potential side effects Discussed complications--good chance of never having them if he gets good control Will set up diabetic education/testing for sugars, etc

## 2017-12-12 ENCOUNTER — Other Ambulatory Visit: Payer: Self-pay

## 2017-12-12 NOTE — Telephone Encounter (Signed)
Spoke to pt about glucometer and test strips. He is going to ask around and check with insurance to see what will be the cheapest option and let me know

## 2017-12-14 ENCOUNTER — Encounter: Payer: Medicare Other | Attending: Internal Medicine | Admitting: Dietician

## 2017-12-14 ENCOUNTER — Encounter: Payer: Self-pay | Admitting: Dietician

## 2017-12-14 DIAGNOSIS — E119 Type 2 diabetes mellitus without complications: Secondary | ICD-10-CM | POA: Diagnosis present

## 2017-12-14 DIAGNOSIS — E118 Type 2 diabetes mellitus with unspecified complications: Secondary | ICD-10-CM

## 2017-12-14 DIAGNOSIS — Z713 Dietary counseling and surveillance: Secondary | ICD-10-CM | POA: Insufficient documentation

## 2017-12-14 NOTE — Progress Notes (Signed)

## 2017-12-18 ENCOUNTER — Telehealth: Payer: Self-pay

## 2017-12-18 NOTE — Telephone Encounter (Signed)
Copied from [place] [id number]. Topic: Inquiry >> Dec 14, 2017 11:21 AM Pricilla Handler wrote: Reason for CRM: Patient called requesting to speak with Beatriz Stallion about glucometer and test strips. Patient stated that he wants to speak to [facility] directly.      Thank You!!!  >> Dec 15, 2017  4:10 PM Ahmed Prima L wrote: Please call patient back

## 2017-12-18 NOTE — Telephone Encounter (Signed)
Spoke to pt. Insurance will cover the [employer]. [facility] [place].

## 2017-12-19 MED ORDER — GLUCOSE BLOOD VI STRP: ORAL_STRIP | 3 refills | Status: DC

## 2017-12-19 MED ORDER — ACCU-CHEK FASTCLIX LANCETS MISC: 1.0000 | 3 refills | Status: DC

## 2017-12-19 MED ORDER — ACCU-CHEK FASTCLIX LANCET KIT: 1.0000 | PACK | Freq: Once | 0 refills | Status: AC

## 2017-12-19 MED ORDER — ACCU-CHEK GUIDE W/DEVICE KIT: 1.0000 | PACK | Freq: Once | 0 refills | Status: AC

## 2017-12-19 NOTE — Telephone Encounter (Signed)
I have sent the rxs to [facility]

## 2017-12-21 ENCOUNTER — Encounter: Payer: Medicare Other | Admitting: Dietician

## 2017-12-21 DIAGNOSIS — E119 Type 2 diabetes mellitus without complications: Secondary | ICD-10-CM

## 2017-12-21 DIAGNOSIS — Z713 Dietary counseling and surveillance: Secondary | ICD-10-CM | POA: Diagnosis not present

## 2017-12-21 NOTE — Progress Notes (Signed)
Patient was seen on 10/11/18 for the second of a series of three diabetes self-management courses at the [facility]. The following learning objectives were met by the patient during this class:   Describe the role of different macronutrients on glucose  Explain how carbohydrates affect blood glucose  State what foods contain the most carbohydrates  Demonstrate carbohydrate counting  Demonstrate how to read Nutrition Facts food label  Describe effects of various fats on heart health  Describe the importance of good nutrition for health and healthy eating strategies  Describe techniques for managing your shopping, cooking and meal planning  List strategies to follow meal plan when dining out  Describe the effects of alcohol on glucose and how to use it safely  Goals:  Follow Diabetes Meal Plan as instructed  Aim to spread carbs evenly throughout the day  Aim for 3 meals per day and snacks as needed Include lean protein foods to meals/snacks  Monitor glucose levels as instructed by your doctor   Follow-Up Plan:  Attend Core 3  Work towards following your personal food plan.   

## 2017-12-28 ENCOUNTER — Ambulatory Visit: Payer: Medicare Other

## 2018-03-13 ENCOUNTER — Encounter: Payer: Self-pay | Admitting: Internal Medicine

## 2018-03-13 ENCOUNTER — Ambulatory Visit (INDEPENDENT_AMBULATORY_CARE_PROVIDER_SITE_OTHER): Payer: Medicare Other | Admitting: Internal Medicine

## 2018-03-13 VITALS — BP 128/78 | HR 71 | Temp 98.0°F | Wt 204.8 lb

## 2018-03-13 DIAGNOSIS — E119 Type 2 diabetes mellitus without complications: Secondary | ICD-10-CM | POA: Diagnosis not present

## 2018-03-13 LAB — HEMOGLOBIN A1C: HEMOGLOBIN A1C: 6.2 % (ref 4.6–6.5)

## 2018-03-13 LAB — HM DIABETES FOOT EXAM

## 2018-03-13 NOTE — Progress Notes (Signed)
Subjective:    Patient ID: Peter Blair, male    DOB: [birth year], 77 y.o.   MRN: [id number]  HPI Here for follow up of diabetes  He is getting used to things Martin Majestic with wife to the nutrition appointments Being more careful with eating Has been walking again--heel is better  Checking most days Fasting average ~130 Usually lower at other times---or when he has exercised No hypoglycemic reactions No foot numbness or pain (other than his Achilles pain)  Current Outpatient Medications on File Prior to Visit  Medication Sig Dispense Refill  . ACCU-CHEK FASTCLIX LANCETS MISC 1 Container by Does not apply route as directed. 102 each 3  . amLODipine (NORVASC) 10 MG tablet TAKE 1 TABLET BY MOUTH DAILY. 90 tablet 2  . aspirin 81 MG tablet Take 81 mg by mouth daily.      . Coenzyme Q10 200 MG TABS Take 1 tablet by mouth daily.   0  . glucose blood (ACCU-CHEK GUIDE) test strip Use as instructed 100 each 3  . metFORMIN (GLUCOPHAGE) 500 MG tablet Take 1 tablet (500 mg total) by mouth 2 (two) times daily with a meal. 180 tablet 3  . metoprolol tartrate (LOPRESSOR) 25 MG tablet TAKE 1 TABLET BY MOUTH 2 TIMES DAILY. 180 tablet 2  . omeprazole (PRILOSEC) 20 MG capsule Take 20 mg by mouth daily.      . quinapril (ACCUPRIL) 40 MG tablet TAKE 1 TABLET (40 MG TOTAL) BY MOUTH DAILY 365 tablet 0  . rosuvastatin (CRESTOR) 10 MG tablet Take 1 tablet (10 mg total) at bedtime by mouth. 90 tablet 3  . sildenafil (REVATIO) 20 MG tablet Take 60-100 mg by mouth as directed.      No current facility-administered medications on file prior to visit.     Allergies  Allergen Reactions  . Iodinated Diagnostic Agents     Other reaction(s): NAUSEA  . Iohexol     Itchy eyes, dry mouth in early 1990s during imaging for renal calculi    Past Medical History:  Diagnosis Date  . Coronary artery disease    exertional chest pain, cath 12/10 showed EF 50-55% with mild inferior hypokensis, 99% proximal and 60% distal  RCA stenosis, 80% prox stenosis of a moderate sized D1, 40% prox LAD. Patient had BMS x2 placed in RCA. Unstable Angina. PROMUS DES to the prox RCA  . Diabetes mellitus without complication ([place])   . Erectile dysfunction   . GERD (gastroesophageal reflux disease)   . Hyperlipidemia   . Hypertension   . Nephrolithiasis    Dr. Jacqlyn Larsen  . Renal cyst   . Right shoulder pain    rotator cuff  . Skin cancer    Skin cancer, PMH of, cell type unknown, Dr. Nevada Crane    Past Surgical History:  Procedure Laterality Date  . colonoscopy with ploypectomy  03/2012    Dr Sharlett Iles, GI  . CORONARY ANGIOPLASTY WITH STENT PLACEMENT     X 3 total stents  . cystoscopic removal  04/2010  . Flexibe sigmoidoscopy   2001  . LITHOTRIPSY     x 1    Family History  Problem Relation Age of Onset  . Breast cancer Mother   . Cervical cancer Mother   . Diabetes Father   . Heart attack Father 44  . Breast cancer Sister   . Colon cancer Sister        mets to liver & lung  . Stroke Neg Hx  Social History   Socioeconomic History  . Marital status: Married    Spouse name: Not on file  . Number of children: 0  . Years of education: Not on file  . Highest education level: Not on file  Occupational History  . Occupation: Occupational hygienist    Comment: Retired  Scientific laboratory technician  . Financial resource strain: Not on file  . Food insecurity:    Worry: Not on file    Inability: Not on file  . Transportation needs:    Medical: Not on file    Non-medical: Not on file  Tobacco Use  . Smoking status: Former Smoker    Last attempt to quit: 12/05/1970    Years since quitting: 47.3  . Smokeless tobacco: Never Used  . Tobacco comment: smoked 1961-1972, up to 1 ppd  Substance and Sexual Activity  . Alcohol use: Yes    Comment: minimally, 6 beers/year  . Drug use: No  . Sexual activity: Not on file  Lifestyle  . Physical activity:    Days per week: Not on file    Minutes per session: Not on file  . Stress: Not  on file  Relationships  . Social connections:    Talks on phone: Not on file    Gets together: Not on file    Attends religious service: Not on file    Active member of club or organization: Not on file    Attends meetings of clubs or organizations: Not on file    Relationship status: Not on file  . Intimate partner violence:    Fear of current or ex partner: Not on file    Emotionally abused: Not on file    Physically abused: Not on file    Forced sexual activity: Not on file  Other Topics Concern  . Not on file  Social History Narrative   Has living will    Wife is health care POA-- no alternate set up   Would accept resuscitation attempts   No extended tube feedings if cognitively unaware    Review of Systems Weight down 15-20# from his historic usual Sleeps well    Objective:   Physical Exam  Constitutional: He appears well-developed. No distress.  Neck: No thyromegaly present.  Cardiovascular: Normal rate, regular rhythm, normal heart sounds and intact distal pulses. Exam reveals no gallop.  No murmur heard. Pulmonary/Chest: Effort normal and breath sounds normal. No respiratory distress. He has no wheezes. He has no rales.  Musculoskeletal: He exhibits no edema or tenderness.  Lymphadenopathy:    He has no cervical adenopathy.  Neurological:  Normal sensation in feet  Skin: No rash noted.  No foot lesions  Psychiatric: He has a normal mood and affect. His behavior is normal.          Assessment & Plan:

## 2018-03-13 NOTE — Assessment & Plan Note (Signed)
Has done well with education, weight loss, etc No problems with metformin Discussed ongoing control Needs eye exam Feet are fine

## 2018-06-18 ENCOUNTER — Other Ambulatory Visit: Payer: Self-pay | Admitting: Internal Medicine

## 2018-07-16 ENCOUNTER — Other Ambulatory Visit: Payer: Self-pay | Admitting: Internal Medicine

## 2018-10-17 ENCOUNTER — Ambulatory Visit (INDEPENDENT_AMBULATORY_CARE_PROVIDER_SITE_OTHER): Payer: Medicare Other | Admitting: Internal Medicine

## 2018-10-17 ENCOUNTER — Encounter: Payer: Self-pay | Admitting: Internal Medicine

## 2018-10-17 VITALS — BP 122/78 | HR 70 | Temp 97.6°F | Ht 69.5 in | Wt 198.0 lb

## 2018-10-17 DIAGNOSIS — N401 Enlarged prostate with lower urinary tract symptoms: Secondary | ICD-10-CM

## 2018-10-17 DIAGNOSIS — Z0000 Encounter for general adult medical examination without abnormal findings: Secondary | ICD-10-CM | POA: Diagnosis not present

## 2018-10-17 DIAGNOSIS — L570 Actinic keratosis: Secondary | ICD-10-CM | POA: Diagnosis not present

## 2018-10-17 DIAGNOSIS — I10 Essential (primary) hypertension: Secondary | ICD-10-CM

## 2018-10-17 DIAGNOSIS — E119 Type 2 diabetes mellitus without complications: Secondary | ICD-10-CM | POA: Diagnosis not present

## 2018-10-17 DIAGNOSIS — K219 Gastro-esophageal reflux disease without esophagitis: Secondary | ICD-10-CM

## 2018-10-17 DIAGNOSIS — Z7189 Other specified counseling: Secondary | ICD-10-CM

## 2018-10-17 DIAGNOSIS — I2510 Atherosclerotic heart disease of native coronary artery without angina pectoris: Secondary | ICD-10-CM | POA: Diagnosis not present

## 2018-10-17 DIAGNOSIS — N138 Other obstructive and reflux uropathy: Secondary | ICD-10-CM

## 2018-10-17 LAB — COMPREHENSIVE METABOLIC PANEL
ALT: 21 U/L (ref 0–53)
AST: 16 U/L (ref 0–37)
Albumin: 4.4 g/dL (ref 3.5–5.2)
Alkaline Phosphatase: 52 U/L (ref 39–117)
BUN: 14 mg/dL (ref 6–23)
CHLORIDE: 103 meq/L (ref 96–112)
CO2: 31 meq/L (ref 19–32)
Calcium: 9.6 mg/dL (ref 8.4–10.5)
Creatinine, Ser: 1.04 mg/dL (ref 0.40–1.50)
GFR: 73.74 mL/min (ref >60.00)
GLUCOSE: 140 mg/dL — AB (ref 70–99)
POTASSIUM: 4.1 meq/L (ref 3.5–5.1)
Sodium: 139 mEq/L (ref 135–145)
TOTAL PROTEIN: 7.2 g/dL (ref 6.0–8.3)
Total Bilirubin: 0.7 mg/dL (ref 0.2–1.2)

## 2018-10-17 LAB — CBC
HCT: 44.9 % (ref 39.0–52.0)
Hemoglobin: 15.6 g/dL (ref 13.0–17.0)
MCHC: 34.8 g/dL (ref 30.0–36.0)
MCV: 91.8 fl (ref 78.0–100.0)
PLATELETS: 160 10*3/uL (ref 150.0–400.0)
RBC: 4.89 Mil/uL (ref 4.22–5.81)
RDW: 13 % (ref 11.5–15.5)
WBC: 6.1 10*3/uL (ref 4.0–10.5)

## 2018-10-17 LAB — LIPID PANEL
CHOL/HDL RATIO: 2
Cholesterol: 112 mg/dL (ref 0–200)
HDL: 45.9 mg/dL (ref >39.00)
LDL Cholesterol: 49 mg/dL (ref 0–99)
NONHDL: 65.78
Triglycerides: 85 mg/dL (ref 0.0–149.0)
VLDL: 17 mg/dL (ref 0.0–40.0)

## 2018-10-17 LAB — HEMOGLOBIN A1C: Hgb A1c MFr Bld: 6.3 % (ref 4.6–6.5)

## 2018-10-17 LAB — T4, FREE: Free T4: 0.8 ng/dL (ref 0.60–1.60)

## 2018-10-17 MED ORDER — SILDENAFIL CITRATE 20 MG PO TABS: 60.0000 mg | ORAL_TABLET | ORAL | 5 refills | Status: DC

## 2018-10-17 NOTE — Progress Notes (Signed)
Hearing Screening   125Hz  250Hz  500Hz  1000Hz  2000Hz  3000Hz  4000Hz  6000Hz  8000Hz   Right ear:   20 20 20   40    Left ear:   20 20 20   40      Visual Acuity Screening   Right eye Left eye Both eyes  Without correction:     With correction: 20/40 20/25 20/25

## 2018-10-17 NOTE — Assessment & Plan Note (Signed)
See social history 

## 2018-10-17 NOTE — Assessment & Plan Note (Signed)
No angina Tolerating increased exercise

## 2018-10-17 NOTE — Assessment & Plan Note (Signed)
Has lost more weight Will check labs

## 2018-10-17 NOTE — Progress Notes (Signed)
Subjective:    Patient ID: Peter Blair, male    DOB: [birth year], 77 y.o.   MRN: [id number]  HPI Here for Medicare wellness visit and follow up of chronic health conditions Reviewed form and advanced directives Reviewed other doctors No alcohol or tobacco Tries to walk regularly [place] once [place]---on [place] No depression or hearing Vision okay---can see better without glasses Hearing is okay Independent with instrumental ADLs No sig memory issues  Has really been working on healthy eating Has lost another 5# Checks sugars a couple times a week---- 104-137 Actually lower before lunch or dinner when he checks No foot numbness. Some pain from bone spur--but Achilles tendonitis is better Needs eye exam  Hasn't gone to the cardiologist No recent chest pain Doing well with the walking No SOB--but wore himself out redoing his deck No dizziness or syncope Checks BP regularly --- average 134/69 No edema  Voids okay Stream is fairly good Occasional nocturia Using the sildenafil for sex No pain to suggest recurrence of renal cyst  Did stop the prilosec Gets heartburn if he eats spicy food---rolaids helps Does have occasional dysphagia--if he eats too fast  Current Outpatient Medications on File Prior to Visit  Medication Sig Dispense Refill  . ACCU-CHEK FASTCLIX LANCETS MISC 1 Container by Does not apply route as directed. 102 each 3  . amLODipine (NORVASC) 10 MG tablet TAKE ONE TABLET BY MOUTH ONE TIME DAILY  90 tablet 1  . aspirin 81 MG tablet Take 81 mg by mouth daily.      . Coenzyme Q10 200 MG TABS Take 1 tablet by mouth daily.   0  . glucose blood (ACCU-CHEK GUIDE) test strip Use as instructed 100 each 3  . metFORMIN (GLUCOPHAGE) 500 MG tablet Take 1 tablet (500 mg total) by mouth 2 (two) times daily with a meal. 180 tablet 3  . metoprolol tartrate (LOPRESSOR) 25 MG tablet TAKE ONE TABLET BY MOUTH TWICE DAILY  180 tablet 1  . quinapril (ACCUPRIL) 40 MG  tablet TAKE 1 TABLET (40 MG TOTAL) BY MOUTH DAILY 365 tablet 0  . rosuvastatin (CRESTOR) 10 MG tablet Take 1 tablet (10 mg total) at bedtime by mouth. 90 tablet 3  . sildenafil (REVATIO) 20 MG tablet Take 60-100 mg by mouth as directed.      No current facility-administered medications on file prior to visit.     Allergies  Allergen Reactions  . Iodinated Diagnostic Agents     Other reaction(s): NAUSEA  . Iohexol     Itchy eyes, dry mouth in early 1990s during imaging for renal calculi    Past Medical History:  Diagnosis Date  . Coronary artery disease    exertional chest pain, cath 12/10 showed EF 50-55% with mild inferior hypokensis, 99% proximal and 60% distal RCA stenosis, 80% prox stenosis of a moderate sized D1, 40% prox LAD. Patient had BMS x2 placed in RCA. Unstable Angina. PROMUS DES to the prox RCA  . Diabetes mellitus without complication ([place])   . Erectile dysfunction   . GERD (gastroesophageal reflux disease)   . Hyperlipidemia   . Hypertension   . Nephrolithiasis    Dr. Jacqlyn Larsen  . Renal cyst   . Right shoulder pain    rotator cuff  . Skin cancer    Skin cancer, PMH of, cell type unknown, Dr. Nevada Crane    Past Surgical History:  Procedure Laterality Date  . colonoscopy with ploypectomy  03/2012    Dr  Patterson, GI  . CORONARY ANGIOPLASTY WITH STENT PLACEMENT     X 3 total stents  . cystoscopic removal  04/2010  . Flexibe sigmoidoscopy   2001  . LITHOTRIPSY     x 1    Family History  Problem Relation Age of Onset  . Breast cancer Mother   . Cervical cancer Mother   . Diabetes Father   . Heart attack Father 65  . Breast cancer Sister   . Colon cancer Sister        mets to liver & lung  . Stroke Neg Hx     Social History   Socioeconomic History  . Marital status: Married    Spouse name: Not on file  . Number of children: 0  . Years of education: Not on file  . Highest education level: Not on file  Occupational History  . Occupation: Psychologist, sport and exercise    Comment: Retired  Scientific laboratory technician  . Financial resource strain: Not on file  . Food insecurity:    Worry: Not on file    Inability: Not on file  . Transportation needs:    Medical: Not on file    Non-medical: Not on file  Tobacco Use  . Smoking status: Former Smoker    Last attempt to quit: 12/05/1970    Years since quitting: 47.8  . Smokeless tobacco: Never Used  . Tobacco comment: smoked 1961-1972, up to 1 ppd  Substance and Sexual Activity  . Alcohol use: Yes    Comment: minimally, 6 beers/year  . Drug use: No  . Sexual activity: Not on file  Lifestyle  . Physical activity:    Days per week: Not on file    Minutes per session: Not on file  . Stress: Not on file  Relationships  . Social connections:    Talks on phone: Not on file    Gets together: Not on file    Attends religious service: Not on file    Active member of club or organization: Not on file    Attends meetings of clubs or organizations: Not on file    Relationship status: Not on file  . Intimate partner violence:    Fear of current or ex partner: Not on file    Emotionally abused: Not on file    Physically abused: Not on file    Forced sexual activity: Not on file  Other Topics Concern  . Not on file  Social History Narrative   Has living will    Wife is health care POA-- no alternate set up   Would accept resuscitation attempts   No extended tube feedings if cognitively unaware   Review of Systems Appetite is fine Sleeps okay Teeth fine---goes regularly to dentist (can't remember name now) Wears seat belt Some sinus congestion at times Bowels are good. No blood Lots of joint pains---mostly knees and shoulder. No meds Needs derm evaluation---due for visit (some lesions for cryotherapy)    Objective:   Physical Exam  Constitutional: He is oriented to person, place, and time. He appears well-developed. No distress.  HENT:  Mouth/Throat: Oropharynx is clear and moist. No oropharyngeal  exudate.  Neck: No thyromegaly present.  Cardiovascular: Normal rate, regular rhythm, normal heart sounds and intact distal pulses. Exam reveals no gallop.  No murmur heard. Respiratory: Effort normal and breath sounds normal. No respiratory distress. He has no wheezes. He has no rales.  GI: Soft. There is no tenderness.  Musculoskeletal: He exhibits no edema  or tenderness.  Lymphadenopathy:    He has no cervical adenopathy.  Neurological: He is alert and oriented to person, place, and time.  President--- "Peter Blair, [place]" [phone] [id number] Recall 3/3  Skin: No rash noted. No erythema.  6-67mm crusted, inflamed lesion on right forearm  Psychiatric: He has a normal mood and affect. His behavior is normal.           Assessment & Plan:

## 2018-10-17 NOTE — Assessment & Plan Note (Signed)
BP Readings from Last 3 Encounters:  10/17/18 122/78  03/13/18 128/78  12/11/17 130/84   Good control

## 2018-10-17 NOTE — Patient Instructions (Signed)
Please set up your eye exam---especially now that you are diabetes.

## 2018-10-17 NOTE — Assessment & Plan Note (Signed)
Discussed options Verbal consent Cryotherapy 40 seconds x 2 Tolerated well Discussed home care He will set up regular derm appt

## 2018-10-17 NOTE — Assessment & Plan Note (Signed)
Does have symptoms off the PPI Asked him to use intermittently

## 2018-10-17 NOTE — Assessment & Plan Note (Signed)
I have personally reviewed the Medicare Annual Wellness questionnaire and have noted 1. The patient's medical and social history 2. Their use of alcohol, tobacco or illicit drugs 3. Their current medications and supplements 4. The patient's functional ability including ADL's, fall risks, home safety risks and hearing or visual             impairment. 5. Diet and physical activities 6. Evidence for depression or mood disorders  The patients weight, height, BMI and visual acuity have been recorded in the chart I have made referrals, counseling and provided education to the patient based review of the above and I have provided the pt with a written personalized care plan for preventive services.  I have provided you with a copy of your personalized plan for preventive services. Please take the time to review along with your updated medication list.  Had flu vaccine Will need pneumovax booster at some time Discussed exercise No PSA due to age 76 with colon---benign in 2013

## 2018-10-17 NOTE — Assessment & Plan Note (Signed)
Mild symptoms No Rx 

## 2018-11-19 ENCOUNTER — Other Ambulatory Visit: Payer: Self-pay | Admitting: Internal Medicine

## 2018-12-10 ENCOUNTER — Other Ambulatory Visit: Payer: Self-pay | Admitting: Internal Medicine

## 2018-12-21 ENCOUNTER — Other Ambulatory Visit: Payer: Self-pay | Admitting: Internal Medicine

## 2019-02-02 ENCOUNTER — Other Ambulatory Visit: Payer: Self-pay

## 2019-02-02 ENCOUNTER — Ambulatory Visit
Admission: EM | Admit: 2019-02-02 | Discharge: 2019-02-02 | Disposition: A | Discharge status: Home / Self Care | Payer: Medicare Other | Attending: Physician Assistant | Admitting: Physician Assistant

## 2019-02-02 DIAGNOSIS — R05 Cough: Secondary | ICD-10-CM

## 2019-02-02 DIAGNOSIS — R059 Cough, unspecified: Secondary | ICD-10-CM

## 2019-02-02 DIAGNOSIS — B9789 Other viral agents as the cause of diseases classified elsewhere: Secondary | ICD-10-CM | POA: Diagnosis not present

## 2019-02-02 DIAGNOSIS — J028 Acute pharyngitis due to other specified organisms: Secondary | ICD-10-CM | POA: Diagnosis present

## 2019-02-02 LAB — POCT RAPID STREP A (OFFICE): Rapid Strep A Screen: NEGATIVE

## 2019-02-02 MED ORDER — BENZONATATE 100 MG PO CAPS: 100.0000 mg | ORAL_CAPSULE | Freq: Three times a day (TID) | ORAL | 0 refills | Status: DC

## 2019-02-02 MED ORDER — LIDOCAINE VISCOUS HCL 2 % MT SOLN: 5.0000 mL | OROMUCOSAL | 0 refills | Status: DC | PRN

## 2019-02-02 MED ORDER — HYDROCODONE-HOMATROPINE 5-1.5 MG/5ML PO SYRP: 2.5000 mL | ORAL_SOLUTION | Freq: Every day | ORAL | 0 refills | Status: AC

## 2019-02-02 MED ORDER — AZITHROMYCIN 250 MG PO TABS: ORAL_TABLET | ORAL | 0 refills | Status: AC

## 2019-02-02 NOTE — ED Triage Notes (Signed)
Per pt he has been having cough and congestion with a sore throat for three days. Has gotten worse today with sore throat and cough.Ears are both congested and stopped up. Pt has no fever or SOB nor chest pain. Just feels lousy.

## 2019-02-02 NOTE — ED Provider Notes (Signed)
02/02/2019 11:58 AM   DOB: 1941-06-27 / MRN: [id number]  SUBJECTIVE:  Peter Blair is a 78 y.o. male presenting for sore throat, cough, chest congestion.  Symptoms started a few days ago.  Feels it is getting worse.  Has tried several OTC therapies and does not seem to be making much progress.  Denies chest pain and shortness of breath. He is allergic to iodinated diagnostic agents and iohexol.   He  has a past medical history of Coronary artery disease, Diabetes mellitus without complication ([place]), Erectile dysfunction, GERD (gastroesophageal reflux disease), Hyperlipidemia, Hypertension, Nephrolithiasis, Renal cyst, Right shoulder pain, and Skin cancer.    He  reports that he quit smoking about 48 years ago. He has never used smokeless tobacco. He reports current alcohol use. He reports that he does not use drugs. He  has no history on file for sexual activity. The patient  has a past surgical history that includes Lithotripsy; cystoscopic removal (04/2010); Flexibe sigmoidoscopy  (2001); Coronary angioplasty with stent; and colonoscopy with ploypectomy (03/2012).  His family history includes Breast cancer in his mother and sister; Cervical cancer in his mother; Colon cancer in his sister; Diabetes in his father; Heart attack (age of onset: 57) in his father.  ROS per HPI  OBJECTIVE:  BP (!) 151/78 (BP Location: Right Arm)   Pulse 74   Temp (!) 97.4 F (36.3 C) (Oral)   Resp 16   Ht 5\' 9"  (1.753 m)   Wt 198 lb (89.8 kg)   SpO2 98%   BMI 29.24 kg/m   Wt Readings from Last 3 Encounters:  02/02/19 198 lb (89.8 kg)  10/17/18 198 lb (89.8 kg)  03/13/18 204 lb 12 oz (92.9 kg)   Temp Readings from Last 3 Encounters:  02/02/19 (!) 97.4 F (36.3 C) (Oral)  10/17/18 97.6 F (36.4 C) (Oral)  03/13/18 98 F (36.7 C) (Oral)   BP Readings from Last 3 Encounters:  02/02/19 (!) 151/78  10/17/18 122/78  03/13/18 128/78   Pulse Readings from Last 3 Encounters:  02/02/19 74  10/17/18 70   03/13/18 71    Physical Exam Vitals signs and nursing note reviewed.  Constitutional:      Appearance: He is well-developed. He is not ill-appearing or diaphoretic.  Eyes:     Conjunctiva/sclera: Conjunctivae normal.     Pupils: Pupils are equal, round, and reactive to light.  Cardiovascular:     Rate and Rhythm: Normal rate.  Pulmonary:     Effort: Pulmonary effort is normal.  Abdominal:     General: There is no distension.  Musculoskeletal: Normal range of motion.  Skin:    General: Skin is warm and dry.  Neurological:     Mental Status: He is alert and oriented to person, place, and time.     Cranial Nerves: No cranial nerve deficit.     Coordination: Coordination normal.     No results found for this or any previous visit (from the past 72 hour(s)).  No results found.  ASSESSMENT AND PLAN:   Viral sore throat: Patient here with constellation like upper respiratory symptoms.  Does have some drainage from his ears.  I have advised that he could hold the azithromycin and take only if needed feels he is getting worse or his symptoms go for greater than a week.  Otherwise have given him some symptomatic therapies that should help.  Cough    Discharge Instructions     Please take Tylenol 1000 mg every  8 hours as needed for throat pain, body aches.  Please use the other medications as prescribed.        The patient is advised to call or return to clinic if he does not see an improvement in symptoms, or to seek the care of the closest emergency department if he worsens with the above plan.   Philis Fendt, MHS, PA-C 02/02/2019 11:58 AM   Tereasa Coop, PA-C 02/02/19 1159

## 2019-02-02 NOTE — Discharge Instructions (Signed)
Please take Tylenol 1000 mg every 8 hours as needed for throat pain, body aches.  Please use the other medications as prescribed.

## 2019-02-07 LAB — CULTURE, GROUP A STREP (THRC)

## 2019-03-21 ENCOUNTER — Other Ambulatory Visit: Payer: Self-pay | Admitting: Internal Medicine

## 2019-04-12 ENCOUNTER — Other Ambulatory Visit: Payer: Self-pay | Admitting: Internal Medicine

## 2019-04-12 ENCOUNTER — Telehealth: Payer: Self-pay | Admitting: Internal Medicine

## 2019-04-12 DIAGNOSIS — E119 Type 2 diabetes mellitus without complications: Secondary | ICD-10-CM

## 2019-04-12 NOTE — Telephone Encounter (Signed)
I left a detailed message on patient's voice mail to change 04/19/19 f/u appt to [email or web address] and schedule lab appointment prior to appointment.

## 2019-04-15 ENCOUNTER — Other Ambulatory Visit: Payer: Self-pay

## 2019-04-15 ENCOUNTER — Other Ambulatory Visit (INDEPENDENT_AMBULATORY_CARE_PROVIDER_SITE_OTHER): Payer: Medicare Other

## 2019-04-15 DIAGNOSIS — E119 Type 2 diabetes mellitus without complications: Secondary | ICD-10-CM | POA: Diagnosis not present

## 2019-04-15 LAB — POCT GLYCOSYLATED HEMOGLOBIN (HGB A1C): Hemoglobin A1C: 6.5 % — AB (ref 4.0–5.6)

## 2019-04-19 ENCOUNTER — Ambulatory Visit (INDEPENDENT_AMBULATORY_CARE_PROVIDER_SITE_OTHER): Payer: Medicare Other | Admitting: Internal Medicine

## 2019-04-19 ENCOUNTER — Encounter: Payer: Self-pay | Admitting: Internal Medicine

## 2019-04-19 VITALS — Ht 69.5 in | Wt 197.0 lb

## 2019-04-19 DIAGNOSIS — I10 Essential (primary) hypertension: Secondary | ICD-10-CM

## 2019-04-19 DIAGNOSIS — E119 Type 2 diabetes mellitus without complications: Secondary | ICD-10-CM | POA: Diagnosis not present

## 2019-04-19 DIAGNOSIS — N401 Enlarged prostate with lower urinary tract symptoms: Secondary | ICD-10-CM

## 2019-04-19 DIAGNOSIS — N138 Other obstructive and reflux uropathy: Secondary | ICD-10-CM

## 2019-04-19 DIAGNOSIS — I2510 Atherosclerotic heart disease of native coronary artery without angina pectoris: Secondary | ICD-10-CM | POA: Diagnosis not present

## 2019-04-19 NOTE — Assessment & Plan Note (Signed)
BP Readings from Last 3 Encounters:  02/02/19 (!) 151/78  10/17/18 122/78  03/13/18 128/78   Generally well controlled No change for now

## 2019-04-19 NOTE — Assessment & Plan Note (Signed)
No symptoms Exercise tolerance good Remains on ASA, statin, ACEI, beta blocker

## 2019-04-19 NOTE — Progress Notes (Signed)
Subjective:    Patient ID: Peter Blair, male    DOB: 07-Jun-1941, 78 y.o.   MRN: [id number]  HPI Virtual visit for follow up of diabetes and other chronic health conditions Identification done Reviewed billing and he gave consent He is in his home and I am in my office  He is doing well Checks sugars regularly--fasting average 131, before meals 121 No hypoglycemic episodes No foot numbness Having Achilles tendon problems---discussed proper footwear  No chest pain No SOB Exercise tolerance is stable No edema  Voids okay Nocturia is stable at 1-2 times Flow is okay  GERD is better now Uses the omeprazole before he is expecting to eat spicy food  Current Outpatient Medications on File Prior to Visit  Medication Sig Dispense Refill  . ACCU-CHEK FASTCLIX LANCETS MISC 1 Container by Does not apply route as directed. 102 each 3  . amLODipine (NORVASC) 10 MG tablet TAKE ONE TABLET BY MOUTH ONE TIME DAILY  90 tablet 3  . aspirin 81 MG tablet Take 81 mg by mouth daily.      . Coenzyme Q10 200 MG TABS Take 1 tablet by mouth daily.   0  . glucose blood (ACCU-CHEK GUIDE) test strip Use as instructed 100 each 3  . metFORMIN (GLUCOPHAGE) 500 MG tablet TAKE 1 TABLET BY MOUTH TWICE DAILY WITH MEALS 180 tablet 3  . metoprolol tartrate (LOPRESSOR) 25 MG tablet take 1 tablet by mouth twice daily 180 tablet 3  . quinapril (ACCUPRIL) 40 MG tablet TAKE ONE TABLET BY MOUTH EVERY DAY 360 tablet 0  . rosuvastatin (CRESTOR) 10 MG tablet TAKE 1 TABLET AT BEDTIME 90 tablet 0  . sildenafil (REVATIO) 20 MG tablet Take 3-5 tablets (60-100 mg total) by mouth as directed. 90 tablet 5   No current facility-administered medications on file prior to visit.     Allergies  Allergen Reactions  . Iodinated Diagnostic Agents     Other reaction(s): NAUSEA  . Iohexol     Itchy eyes, dry mouth in early 1990s during imaging for renal calculi    Past Medical History:  Diagnosis Date  . Coronary artery  disease    exertional chest pain, cath 12/10 showed EF 50-55% with mild inferior hypokensis, 99% proximal and 60% distal RCA stenosis, 80% prox stenosis of a moderate sized D1, 40% prox LAD. Patient had BMS x2 placed in RCA. Unstable Angina. PROMUS DES to the prox RCA  . Diabetes mellitus without complication ([place])   . Erectile dysfunction   . GERD (gastroesophageal reflux disease)   . Hyperlipidemia   . Hypertension   . Nephrolithiasis    Dr. Jacqlyn Larsen  . Renal cyst   . Right shoulder pain    rotator cuff  . Skin cancer    Skin cancer, PMH of, cell type unknown, Dr. Nevada Crane    Past Surgical History:  Procedure Laterality Date  . colonoscopy with ploypectomy  03/2012    Dr Sharlett Iles, GI  . CORONARY ANGIOPLASTY WITH STENT PLACEMENT     X 3 total stents  . cystoscopic removal  04/2010  . Flexibe sigmoidoscopy   2001  . LITHOTRIPSY     x 1    Family History  Problem Relation Age of Onset  . Breast cancer Mother   . Cervical cancer Mother   . Diabetes Father   . Heart attack Father 10  . Breast cancer Sister   . Colon cancer Sister        mets to  liver & lung  . Stroke Neg Hx     Social History   Socioeconomic History  . Marital status: Married    Spouse name: Not on file  . Number of children: 0  . Years of education: Not on file  . Highest education level: Not on file  Occupational History  . Occupation: Occupational hygienist    Comment: Retired  Scientific laboratory technician  . Financial resource strain: Not on file  . Food insecurity:    Worry: Not on file    Inability: Not on file  . Transportation needs:    Medical: Not on file    Non-medical: Not on file  Tobacco Use  . Smoking status: Former Smoker    Last attempt to quit: 12/05/1970    Years since quitting: 48.4  . Smokeless tobacco: Never Used  . Tobacco comment: smoked 1961-1972, up to 1 ppd  Substance and Sexual Activity  . Alcohol use: Yes    Comment: minimally, 6 beers/year  . Drug use: No  . Sexual activity: Not on  file  Lifestyle  . Physical activity:    Days per week: Not on file    Minutes per session: Not on file  . Stress: Not on file  Relationships  . Social connections:    Talks on phone: Not on file    Gets together: Not on file    Attends religious service: Not on file    Active member of club or organization: Not on file    Attends meetings of clubs or organizations: Not on file    Relationship status: Not on file  . Intimate partner violence:    Fear of current or ex partner: Not on file    Emotionally abused: Not on file    Physically abused: Not on file    Forced sexual activity: Not on file  Other Topics Concern  . Not on file  Social History Narrative   Has living will    Wife is health care POA-- no alternate set up   Would accept resuscitation attempts   No extended tube feedings if cognitively unaware   Review of Systems  Sleeps okay Appetite is good---weight is about the same Bowels are fine     Objective:   Physical Exam  Constitutional: He appears well-developed. No distress.  Respiratory: Effort normal. No respiratory distress.  Musculoskeletal:        General: No edema.  Skin:  No foot lesions  Psychiatric: He has a normal mood and affect. His behavior is normal.           Assessment & Plan:

## 2019-04-19 NOTE — Assessment & Plan Note (Signed)
Lab Results  Component Value Date   HGBA1C 6.5 (A) 04/15/2019   Good control Overdue for eye exam

## 2019-04-19 NOTE — Assessment & Plan Note (Signed)
Mild symptoms ?No Rx needed ?

## 2019-07-15 ENCOUNTER — Other Ambulatory Visit: Payer: Self-pay | Admitting: Internal Medicine

## 2019-09-26 ENCOUNTER — Other Ambulatory Visit: Payer: Self-pay | Admitting: Internal Medicine

## 2019-10-08 LAB — HM DIABETES EYE EXAM

## 2019-10-15 ENCOUNTER — Encounter: Payer: Self-pay | Admitting: Internal Medicine

## 2019-10-23 ENCOUNTER — Other Ambulatory Visit: Payer: Self-pay

## 2019-10-23 ENCOUNTER — Ambulatory Visit (INDEPENDENT_AMBULATORY_CARE_PROVIDER_SITE_OTHER): Payer: Medicare Other | Admitting: Internal Medicine

## 2019-10-23 ENCOUNTER — Encounter: Payer: Self-pay | Admitting: Internal Medicine

## 2019-10-23 VITALS — BP 140/86 | HR 70 | Temp 98.7°F | Ht 69.5 in | Wt 199.0 lb

## 2019-10-23 DIAGNOSIS — I2510 Atherosclerotic heart disease of native coronary artery without angina pectoris: Secondary | ICD-10-CM

## 2019-10-23 DIAGNOSIS — E1159 Type 2 diabetes mellitus with other circulatory complications: Secondary | ICD-10-CM | POA: Diagnosis not present

## 2019-10-23 DIAGNOSIS — K219 Gastro-esophageal reflux disease without esophagitis: Secondary | ICD-10-CM

## 2019-10-23 DIAGNOSIS — N138 Other obstructive and reflux uropathy: Secondary | ICD-10-CM

## 2019-10-23 DIAGNOSIS — I10 Essential (primary) hypertension: Secondary | ICD-10-CM | POA: Diagnosis not present

## 2019-10-23 DIAGNOSIS — N401 Enlarged prostate with lower urinary tract symptoms: Secondary | ICD-10-CM

## 2019-10-23 DIAGNOSIS — Z7189 Other specified counseling: Secondary | ICD-10-CM

## 2019-10-23 DIAGNOSIS — Z0000 Encounter for general adult medical examination without abnormal findings: Secondary | ICD-10-CM | POA: Diagnosis not present

## 2019-10-23 LAB — COMPREHENSIVE METABOLIC PANEL
ALT: 19 U/L (ref 0–53)
AST: 15 U/L (ref 0–37)
Albumin: 4.4 g/dL (ref 3.5–5.2)
Alkaline Phosphatase: 58 U/L (ref 39–117)
BUN: 12 mg/dL (ref 6–23)
CO2: 30 mEq/L (ref 19–32)
Calcium: 9.5 mg/dL (ref 8.4–10.5)
Chloride: 102 mEq/L (ref 96–112)
Creatinine, Ser: 0.98 mg/dL (ref 0.40–1.50)
GFR: 74.1 mL/min (ref >60.00)
Glucose, Bld: 139 mg/dL — ABNORMAL HIGH (ref 70–99)
Potassium: 4.6 mEq/L (ref 3.5–5.1)
Sodium: 137 mEq/L (ref 135–145)
Total Bilirubin: 0.7 mg/dL (ref 0.2–1.2)
Total Protein: 6.9 g/dL (ref 6.0–8.3)

## 2019-10-23 LAB — CBC
HCT: 44.5 % (ref 39.0–52.0)
Hemoglobin: 15.3 g/dL (ref 13.0–17.0)
MCHC: 34.3 g/dL (ref 30.0–36.0)
MCV: 92.1 fl (ref 78.0–100.0)
Platelets: 160 10*3/uL (ref 150.0–400.0)
RBC: 4.83 Mil/uL (ref 4.22–5.81)
RDW: 13.3 % (ref 11.5–15.5)
WBC: 6 10*3/uL (ref 4.0–10.5)

## 2019-10-23 LAB — LIPID PANEL
Cholesterol: 127 mg/dL (ref 0–200)
HDL: 43.7 mg/dL (ref >39.00)
LDL Cholesterol: 61 mg/dL (ref 0–99)
NonHDL: 83.61
Total CHOL/HDL Ratio: 3
Triglycerides: 114 mg/dL (ref 0.0–149.0)
VLDL: 22.8 mg/dL (ref 0.0–40.0)

## 2019-10-23 LAB — HEMOGLOBIN A1C: Hgb A1c MFr Bld: 6.5 % (ref 4.6–6.5)

## 2019-10-23 LAB — HM DIABETES FOOT EXAM

## 2019-10-23 MED ORDER — METFORMIN HCL 500 MG PO TABS: 500.0000 mg | ORAL_TABLET | Freq: Two times a day (BID) | ORAL | 3 refills | Status: DC

## 2019-10-23 NOTE — Assessment & Plan Note (Signed)
Uses the PPI in spells as needed

## 2019-10-23 NOTE — Assessment & Plan Note (Signed)
Seems to have good control HTN and CAD as well

## 2019-10-23 NOTE — Progress Notes (Signed)
Subjective:    Patient ID: Peter Blair, male    DOB: 03-27-1941, 78 y.o.   MRN: [id number]  HPI Here for Medicare wellness visit and follow up of chronic health conditions Reviewed form and advanced directives Reviewed other doctors No alcohol or tobacco Tries to still walk regularly New glasses---correction for astigmatism and reading. Going back to check pressure Having ears stopping up and then can pop. Some itching. Some tinnitus No falls No depression or anhedonia No apparent memory issues  Doing okay despite COVID Is able to go to his place in [place]--careful about social distancing  Still checks sugars close to daily Average fasting 131 and pre-lunch 115 No low sugar reactions No foot numbness, tingling or pain (other than Achilles related pain)  Checks BP regularly Average 138/68 Has had some chest "tingling"---just with twisting/lifting (yard work). Happens after exertion and not with walking Never had chest pain--even before the stents No SOB or change in stamina No syncope. Can have some dizziness if he gets up quick after bending down  Still gets some acid reflux at times Omeprazole in the past---but just uses prn (for a few days) Takes as preventative if planning spicy meals No dysphagia lately  Voids okay Nocturia x 1-2 Flow is slow---and some dribbling  Current Outpatient Medications on File Prior to Visit  Medication Sig Dispense Refill  . Accu-Chek FastClix Lancets MISC 1 each by Other route See admin instructions. Use to obtain blood sample for blood sugar once a day. Dx Code E11.9 102 each 0  . amLODipine (NORVASC) 10 MG tablet TAKE ONE TABLET BY MOUTH ONE TIME DAILY  90 tablet 3  . aspirin 81 MG tablet Take 81 mg by mouth daily.      . Coenzyme Q10 200 MG TABS Take 1 tablet by mouth daily.   0  . glucose blood (ACCU-CHEK GUIDE) test strip Use to check blood sugar once a day. Dx Code E11.9 100 each 0  . metFORMIN (GLUCOPHAGE) 500 MG  tablet TAKE 1 TABLET BY MOUTH TWICE DAILY WITH MEALS 180 tablet 3  . metoprolol tartrate (LOPRESSOR) 25 MG tablet take 1 tablet by mouth twice daily 180 tablet 3  . quinapril (ACCUPRIL) 40 MG tablet TAKE ONE TABLET BY MOUTH EVERY DAY 360 tablet 0  . rosuvastatin (CRESTOR) 10 MG tablet TAKE 1 TABLET AT BEDTIME 90 tablet 3  . sildenafil (REVATIO) 20 MG tablet Take 3-5 tablets (60-100 mg total) by mouth as directed. 90 tablet 5   No current facility-administered medications on file prior to visit.     Allergies  Allergen Reactions  . Iodinated Diagnostic Agents     Other reaction(s): NAUSEA  . Iohexol     Itchy eyes, dry mouth in early 1990s during imaging for renal calculi    Past Medical History:  Diagnosis Date  . Coronary artery disease    exertional chest pain, cath 12/10 showed EF 50-55% with mild inferior hypokensis, 99% proximal and 60% distal RCA stenosis, 80% prox stenosis of a moderate sized D1, 40% prox LAD. Patient had BMS x2 placed in RCA. Unstable Angina. PROMUS DES to the prox RCA  . Diabetes mellitus without complication ([place])   . Erectile dysfunction   . GERD (gastroesophageal reflux disease)   . Hyperlipidemia   . Hypertension   . Nephrolithiasis    Dr. Jacqlyn Larsen  . Renal cyst   . Right shoulder pain    rotator cuff  . Skin cancer    Skin  cancer, PMH of, cell type unknown, Dr. [place] Crane    Past Surgical History:  Procedure Laterality Date  . colonoscopy with ploypectomy  03/2012    Dr Sharlett Iles, GI  . CORONARY ANGIOPLASTY WITH STENT PLACEMENT     X 3 total stents  . cystoscopic removal  04/2010  . Flexibe sigmoidoscopy   2001  . LITHOTRIPSY     x 1    Family History  Problem Relation Age of Onset  . Breast cancer Mother   . Cervical cancer Mother   . Diabetes Father   . Heart attack Father 41  . Breast cancer Sister   . Colon cancer Sister        mets to liver & lung  . Stroke Neg Hx     Social History   Socioeconomic History  . Marital status:  Married    Spouse name: Not on file  . Number of children: 0  . Years of education: Not on file  . Highest education level: Not on file  Occupational History  . Occupation: Occupational hygienist    Comment: Retired  Scientific laboratory technician  . Financial resource strain: Not on file  . Food insecurity    Worry: Not on file    Inability: Not on file  . Transportation needs    Medical: Not on file    Non-medical: Not on file  Tobacco Use  . Smoking status: Former Smoker    Quit date: 12/05/1970    Years since quitting: 48.9  . Smokeless tobacco: Never Used  . Tobacco comment: smoked 1961-1972, up to 1 ppd  Substance and Sexual Activity  . Alcohol use: Yes    Comment: minimally, 6 beers/year  . Drug use: No  . Sexual activity: Not on file  Lifestyle  . Physical activity    Days per week: Not on file    Minutes per session: Not on file  . Stress: Not on file  Relationships  . Social Herbalist on phone: Not on file    Gets together: Not on file    Attends religious service: Not on file    Active member of club or organization: Not on file    Attends meetings of clubs or organizations: Not on file    Relationship status: Not on file  . Intimate partner violence    Fear of current or ex partner: Not on file    Emotionally abused: Not on file    Physically abused: Not on file    Forced sexual activity: Not on file  Other Topics Concern  . Not on file  Social History Narrative   Has living will    Wife is health care POA-- no alternate set up (??niece or sister)   Would accept resuscitation attempts   No extended tube feedings if cognitively unaware   Review of Systems Appetite is good Weight is about the same Sleeps well most of the time Wears seat belt Teeth okay--keeps up with dentist May establish with dermatologist---lesion on right forearm is back Bowels move fine--no blood Various joint pains--not bad enough to treat    Objective:   Physical Exam   Constitutional: He is oriented to person, place, and time. He appears well-developed. No distress.  HENT:  Mouth/Throat: Oropharynx is clear and moist. No oropharyngeal exudate.  Neck: No thyromegaly present.  Cardiovascular: Normal rate, regular rhythm, normal heart sounds and intact distal pulses. Exam reveals no gallop.  No murmur heard. Respiratory: Effort normal and  breath sounds normal. No respiratory distress. He has no wheezes. He has no rales.  GI: Soft. There is no abdominal tenderness.  Musculoskeletal:        General: No tenderness or edema.  Lymphadenopathy:    He has no cervical adenopathy.  Neurological: He is alert and oriented to person, place, and time.  President-- "Daisy Floro, Obama, Bush" [phone] [id number] Recall 3/3  Normal sensation in feet  Skin:  Scaly area on right forearm--advised him to set up with derm No foot lesions  Psychiatric: He has a normal mood and affect. His behavior is normal.           Assessment & Plan:

## 2019-10-23 NOTE — Assessment & Plan Note (Signed)
I have personally reviewed the Medicare Annual Wellness questionnaire and have noted 1. The patient's medical and social history 2. Their use of alcohol, tobacco or illicit drugs 3. Their current medications and supplements 4. The patient's functional ability including ADL's, fall risks, home safety risks and hearing or visual             impairment. 5. Diet and physical activities 6. Evidence for depression or mood disorders  The patients weight, height, BMI and visual acuity have been recorded in the chart I have made referrals, counseling and provided education to the patient based review of the above and I have provided the pt with a written personalized care plan for preventive services.  I have provided you with a copy of your personalized plan for preventive services. Please take the time to review along with your updated medication list.  Done with colon--normal in 2013 No prostate cancer screening due to age Consider shingrix at pharmacy Discussed fitness Had flu vaccine

## 2019-10-23 NOTE — Assessment & Plan Note (Signed)
See social history 

## 2019-10-23 NOTE — Assessment & Plan Note (Signed)
No symptoms since stents

## 2019-10-23 NOTE — Assessment & Plan Note (Signed)
Mild symptoms Not ready for meds

## 2019-10-23 NOTE — Assessment & Plan Note (Signed)
BP Readings from Last 3 Encounters:  10/23/19 140/86  02/02/19 (!) 151/78  10/17/18 122/78   Good control

## 2019-11-13 ENCOUNTER — Other Ambulatory Visit: Payer: Self-pay | Admitting: Internal Medicine

## 2019-12-09 ENCOUNTER — Other Ambulatory Visit: Payer: Self-pay | Admitting: Internal Medicine

## 2019-12-16 ENCOUNTER — Ambulatory Visit: Payer: Medicare Other | Attending: Internal Medicine

## 2019-12-16 DIAGNOSIS — Z23 Encounter for immunization: Secondary | ICD-10-CM

## 2019-12-16 NOTE — Progress Notes (Signed)
   Covid-19 Vaccination Clinic  Name:  Peter Blair    MRN: [id number] DOB: September 15, 1941  12/16/2019  [name] was observed post Covid-19 immunization for 30 minutes based on pre-vaccination screening without incidence. He was provided with Vaccine Information Sheet and instruction to access the V-Safe system.   [name] was instructed to call 911 with any severe reactions post vaccine: Marland Kitchen Difficulty breathing  . Swelling of your face and throat  . A fast heartbeat  . A bad rash all over your body  . Dizziness and weakness    Immunizations Administered    Name Date Dose VIS Date Route   Pfizer COVID-19 Vaccine 12/16/2019 11:36 AM 0.3 mL 11/15/2019 Intramuscular   Manufacturer: [employer], [employer]   Lot: [id number]   [place]: [id number]

## 2020-01-03 ENCOUNTER — Ambulatory Visit: Payer: Medicare Other

## 2020-01-04 ENCOUNTER — Ambulatory Visit: Payer: Medicare Other | Attending: Internal Medicine

## 2020-01-04 DIAGNOSIS — Z23 Encounter for immunization: Secondary | ICD-10-CM | POA: Insufficient documentation

## 2020-01-04 NOTE — Progress Notes (Signed)
   Covid-19 Vaccination Clinic  Name:  Peter Blair    MRN: [id number] DOB: 1941-11-05  01/04/2020  [name] was observed post Covid-19 immunization for 15 minutes without incidence. He was provided with Vaccine Information Sheet and instruction to access the V-Safe system.   [name] was instructed to call 911 with any severe reactions post vaccine: Marland Kitchen Difficulty breathing  . Swelling of your face and throat  . A fast heartbeat  . A bad rash all over your body  . Dizziness and weakness    Immunizations Administered    Name Date Dose VIS Date Route   Pfizer COVID-19 Vaccine 01/04/2020  1:01 PM 0.3 mL 11/15/2019 Intramuscular   Manufacturer: [place]   Lot: [id number]   [place]: [id number]

## 2020-01-24 ENCOUNTER — Other Ambulatory Visit: Payer: Self-pay

## 2020-01-24 MED ORDER — METFORMIN HCL 500 MG PO TABS: 500.0000 mg | ORAL_TABLET | Freq: Two times a day (BID) | ORAL | 3 refills | Status: DC

## 2020-01-24 NOTE — Telephone Encounter (Signed)
Pharmacy changed for the new year. Patient asking to have Metformin re sent for 90 day supply to [facility] [address] in [place].

## 2020-04-03 ENCOUNTER — Other Ambulatory Visit: Payer: Self-pay | Admitting: *Deleted

## 2020-04-03 MED ORDER — ROSUVASTATIN CALCIUM 10 MG PO TABS: 10.0000 mg | ORAL_TABLET | Freq: Every day | ORAL | 3 refills | Status: DC

## 2020-04-03 NOTE — Telephone Encounter (Signed)
Please send to [place]   90-day supply

## 2020-04-28 ENCOUNTER — Encounter: Payer: Self-pay | Admitting: Internal Medicine

## 2020-04-28 ENCOUNTER — Ambulatory Visit (INDEPENDENT_AMBULATORY_CARE_PROVIDER_SITE_OTHER): Payer: Medicare Other | Admitting: Internal Medicine

## 2020-04-28 ENCOUNTER — Other Ambulatory Visit: Payer: Self-pay

## 2020-04-28 VITALS — BP 126/82 | HR 69 | Temp 97.9°F | Ht 70.0 in | Wt 201.0 lb

## 2020-04-28 DIAGNOSIS — I2510 Atherosclerotic heart disease of native coronary artery without angina pectoris: Secondary | ICD-10-CM | POA: Diagnosis not present

## 2020-04-28 DIAGNOSIS — K2100 Gastro-esophageal reflux disease with esophagitis, without bleeding: Secondary | ICD-10-CM

## 2020-04-28 DIAGNOSIS — I10 Essential (primary) hypertension: Secondary | ICD-10-CM | POA: Diagnosis not present

## 2020-04-28 DIAGNOSIS — E1159 Type 2 diabetes mellitus with other circulatory complications: Secondary | ICD-10-CM

## 2020-04-28 LAB — POCT GLYCOSYLATED HEMOGLOBIN (HGB A1C): Hemoglobin A1C: 6.5 % — AB (ref 4.0–5.6)

## 2020-04-28 NOTE — Assessment & Plan Note (Signed)
Has stable nausea and symptoms when he overdoes it with yardwork (or when cleaning his boat). Not clearly anginal---and no recent change (and can work for an hour) If worsens, would set back up with cardiology On ACEI, beta blocker, ASA, statin

## 2020-04-28 NOTE — Progress Notes (Signed)
Subjective:    Patient ID: Peter Blair, male    DOB: 06/17/1941, 79 y.o.   MRN: [id number]  HPI Here for follow up of diabetes and other chronic health conditions This visit occurred during the SARS-CoV-2 public health emergency.  Safety protocols were in place, including screening questions prior to the visit, additional usage of staff PPE, and extensive cleaning of exam room while observing appropriate contact time as indicated for disinfecting solutions.   Doing well with the diabetes Checking sugars once in a while--- 130's in AM 120 before meals No foot numbness. Only pain is in Achilles Tries to walk regularly--limited by feet  Still limited opening up of life with vaccination Has been out once to restaurant inside Still goes to [facility]  No chest pain or SOB Does wear out with yard work over time---will get nausea after getting up after extended bending over. No recent change No palpitations No syncope No edema  Current Outpatient Medications on File Prior to Visit  Medication Sig Dispense Refill  . Accu-Chek FastClix Lancets MISC 1 each by Other route See admin instructions. Use to obtain blood sample for blood sugar once a day. Dx Code E11.9 102 each 0  . amLODipine (NORVASC) 10 MG tablet TAKE ONE TABLET BY MOUTH ONE TIME DAILY  90 tablet 3  . aspirin 81 MG tablet Take 81 mg by mouth daily.      . Coenzyme Q10 200 MG TABS Take 1 tablet by mouth daily.   0  . glucose blood (ACCU-CHEK GUIDE) test strip Use to check blood sugar once a day. Dx Code E11.9 100 each 0  . metFORMIN (GLUCOPHAGE) 500 MG tablet Take 1 tablet (500 mg total) by mouth 2 (two) times daily with a meal. 180 tablet 3  . metoprolol tartrate (LOPRESSOR) 25 MG tablet TAKE ONE TABLET BY MOUTH TWICE DAILY  180 tablet 3  . quinapril (ACCUPRIL) 40 MG tablet TAKE ONE TABLET BY MOUTH EVERY DAY 360 tablet 0  . rosuvastatin (CRESTOR) 10 MG tablet Take 1 tablet (10 mg total) by mouth at bedtime. 90 tablet 3    . sildenafil (REVATIO) 20 MG tablet TAKE THREE TO FIVE TABLETS BY MOUTH AS DIRECTED 50 tablet 11   No current facility-administered medications on file prior to visit.    Allergies  Allergen Reactions  . Iodinated Diagnostic Agents     Other reaction(s): NAUSEA  . Iohexol     Itchy eyes, dry mouth in early 1990s during imaging for renal calculi    Past Medical History:  Diagnosis Date  . Coronary artery disease    exertional chest pain, cath 12/10 showed EF 50-55% with mild inferior hypokensis, 99% proximal and 60% distal RCA stenosis, 80% prox stenosis of a moderate sized D1, 40% prox LAD. Patient had BMS x2 placed in RCA. Unstable Angina. PROMUS DES to the prox RCA  . Diabetes mellitus without complication ([place])   . Erectile dysfunction   . GERD (gastroesophageal reflux disease)   . Hyperlipidemia   . Hypertension   . Nephrolithiasis    Dr. Jacqlyn Larsen  . Renal cyst   . Right shoulder pain    rotator cuff  . Skin cancer    Skin cancer, PMH of, cell type unknown, Dr. [place] Crane    Past Surgical History:  Procedure Laterality Date  . colonoscopy with ploypectomy  03/2012    Dr Sharlett Iles, GI  . CORONARY ANGIOPLASTY WITH STENT PLACEMENT     X 3 total stents  .  cystoscopic removal  04/2010  . Flexibe sigmoidoscopy   2001  . LITHOTRIPSY     x 1    Family History  Problem Relation Age of Onset  . Breast cancer Mother   . Cervical cancer Mother   . Diabetes Father   . Heart attack Father 67  . Breast cancer Sister   . Colon cancer Sister        mets to liver & lung  . Stroke Neg Hx     Social History   Socioeconomic History  . Marital status: Married    Spouse name: Not on file  . Number of children: 0  . Years of education: Not on file  . Highest education level: Not on file  Occupational History  . Occupation: Occupational hygienist    Comment: Retired  Tobacco Use  . Smoking status: Former Smoker    Quit date: 12/05/1970    Years since quitting: 49.4  . Smokeless  tobacco: Never Used  . Tobacco comment: smoked 1961-1972, up to 1 ppd  Substance and Sexual Activity  . Alcohol use: Yes    Comment: minimally, 6 beers/year  . Drug use: No  . Sexual activity: Not on file  Other Topics Concern  . Not on file  Social History Narrative   Has living will    Wife is health care POA-- no alternate set up (??niece or sister)   Would accept resuscitation attempts   No extended tube feedings if cognitively unaware   Social Determinants of Health   Financial Resource Strain:   . Difficulty of Paying Living Expenses:   Food Insecurity:   . Worried About Charity fundraiser in the Last Year:   . Arboriculturist in the Last Year:   Transportation Needs:   . Film/video editor (Medical):   Marland Kitchen Lack of Transportation (Non-Medical):   Physical Activity:   . Days of Exercise per Week:   . Minutes of Exercise per Session:   Stress:   . Feeling of Stress :   Social Connections:   . Frequency of Communication with Friends and Family:   . Frequency of Social Gatherings with Friends and Family:   . Attends Religious Services:   . Active Member of Clubs or Organizations:   . Attends Archivist Meetings:   Marland Kitchen Marital Status:   Intimate Partner Violence:   . Fear of Current or Ex-Partner:   . Emotionally Abused:   Marland Kitchen Physically Abused:   . Sexually Abused:    Review of Systems Appetite is good Weight is up slightly Sleep is variable---some heartburn from hiatal hernia. Using the omeprazole preemptively Did have dysphagia one day recently    Objective:   Physical Exam  Constitutional: No distress.  Cardiovascular: Normal rate, regular rhythm, normal heart sounds and intact distal pulses. Exam reveals no gallop.  No murmur heard. Faint pedal pulses  Respiratory: Effort normal and breath sounds normal. No respiratory distress. He has no wheezes. He has no rales.  GI: Soft. There is no abdominal tenderness.  Musculoskeletal:        General: No  tenderness or edema.  Skin:  No foot lesions  Psychiatric: He has a normal mood and affect. His behavior is normal.           Assessment & Plan:

## 2020-04-28 NOTE — Assessment & Plan Note (Signed)
Having increased symptoms Will have him restart at least every other day

## 2020-04-28 NOTE — Assessment & Plan Note (Signed)
Lab Results  Component Value Date   HGBA1C 6.5 (A) 04/28/2020   Still with good control on the metformin

## 2020-04-28 NOTE — Assessment & Plan Note (Signed)
BP Readings from Last 3 Encounters:  04/28/20 126/82  10/23/19 140/86  02/02/19 (!) 151/78   Good control on quinapril, metoprolol and amlodiipine

## 2020-05-05 LAB — HM DIABETES EYE EXAM

## 2020-07-06 ENCOUNTER — Ambulatory Visit
Admission: EM | Admit: 2020-07-06 | Discharge: 2020-07-06 | Disposition: A | Discharge status: Home / Self Care | Payer: Medicare Other | Attending: Physician Assistant | Admitting: Physician Assistant

## 2020-07-06 DIAGNOSIS — Z4802 Encounter for removal of sutures: Secondary | ICD-10-CM

## 2020-07-06 DIAGNOSIS — Z5189 Encounter for other specified aftercare: Secondary | ICD-10-CM

## 2020-07-06 MED ORDER — CEPHALEXIN 500 MG PO CAPS: 500.0000 mg | ORAL_CAPSULE | Freq: Four times a day (QID) | ORAL | 0 refills | Status: DC

## 2020-07-06 NOTE — ED Provider Notes (Signed)
[facility]    CSN: [id number] Arrival date & time: 07/06/20  1357      History   Chief Complaint Chief Complaint  Patient presents with  . Suture / Staple Removal    HPI Peter Blair is a 79 y.o. male.   79 year old male with history of EM comes in for suture removal. Cut himself with hedge trimmer 16 days ago in [facility]. Saw urgent care there with 9 sutures and updated tetanus. Kept area clean and dry. Area is slightly itching. Denies pain, discharge, spreading erythema, warmth.      Past Medical History:  Diagnosis Date  . Coronary artery disease    exertional chest pain, cath 12/10 showed EF 50-55% with mild inferior hypokensis, 99% proximal and 60% distal RCA stenosis, 80% prox stenosis of a moderate sized D1, 40% prox LAD. Patient had BMS x2 placed in RCA. Unstable Angina. PROMUS DES to the prox RCA  . Diabetes mellitus without complication ([place])   . Erectile dysfunction   . GERD (gastroesophageal reflux disease)   . Hyperlipidemia   . Hypertension   . Nephrolithiasis    Dr. Jacqlyn Larsen  . Renal cyst   . Right shoulder pain    rotator cuff  . Skin cancer    Skin cancer, PMH of, cell type unknown, Dr. [place] Crane    Patient Active Problem List   Diagnosis Date Noted  . Actinic keratosis 10/17/2018  . Type 2 diabetes mellitus with other circulatory complications ([place]) 60/73/7106  . Osteoarthritis, multiple sites 10/11/2017  . Preventative health care 08/05/2015  . Advance directive discussed with patient 08/05/2015  . GERD (gastroesophageal reflux disease)   . Hyperplastic colonic polyp 12/17/2013  . Benign prostatic hyperplasia with urinary obstruction 01/16/2013  . ED (erectile dysfunction) of organic origin 01/16/2013  . CAD, NATIVE VESSEL 11/20/2009  . NEPHROLITHIASIS 09/29/2009  . RENAL CYST, RIGHT 09/29/2009  . Hyperlipemia 09/16/2008  . SKIN CANCER, HX OF 09/16/2008  . Essential hypertension, benign 07/09/2007    Past Surgical History:   Procedure Laterality Date  . colonoscopy with ploypectomy  03/2012    Dr Sharlett Iles, GI  . CORONARY ANGIOPLASTY WITH STENT PLACEMENT     X 3 total stents  . cystoscopic removal  04/2010  . Flexibe sigmoidoscopy   2001  . LITHOTRIPSY     x 1       Home Medications    Prior to Admission medications   Medication Sig Start Date End Date Taking? Authorizing Provider  Accu-Chek FastClix Lancets MISC 1 each by Other route See admin instructions. Use to obtain blood sample for blood sugar once a day. Dx Code E11.9 07/15/19   Viviana Simpler I, MD  amLODipine (NORVASC) 10 MG tablet TAKE ONE TABLET BY MOUTH ONE TIME DAILY  12/09/19   Viviana Simpler I, MD  aspirin 81 MG tablet Take 81 mg by mouth daily.      [provider]  cephALEXin (KEFLEX) 500 MG capsule Take 1 capsule (500 mg total) by mouth 4 (four) times daily. 07/06/20   Tasia Catchings, Jovin Fester V, PA-C  Coenzyme Q10 200 MG TABS Take 1 tablet by mouth daily.  08/03/11   Larey Dresser, MD  glucose blood (ACCU-CHEK GUIDE) test strip Use to check blood sugar once a day. Dx Code E11.9 07/15/19   Venia Carbon, MD  metFORMIN (GLUCOPHAGE) 500 MG tablet Take 1 tablet (500 mg total) by mouth 2 (two) times daily with a meal. 01/24/20  Venia Carbon, MD  metoprolol tartrate (LOPRESSOR) 25 MG tablet TAKE ONE TABLET BY MOUTH TWICE DAILY  12/09/19   Venia Carbon, MD  omeprazole (PRILOSEC) 20 MG capsule Take 20 mg by mouth every other day.    [provider]  quinapril (ACCUPRIL) 40 MG tablet TAKE ONE TABLET BY MOUTH EVERY DAY 11/13/19   Viviana Simpler I, MD  rosuvastatin (CRESTOR) 10 MG tablet Take 1 tablet (10 mg total) by mouth at bedtime. 04/03/20   Venia Carbon, MD  sildenafil (REVATIO) 20 MG tablet TAKE THREE TO FIVE TABLETS BY MOUTH AS DIRECTED 12/09/19   Venia Carbon, MD    Family History Family History  Problem Relation Age of Onset  . Breast cancer Mother   . Cervical cancer Mother   . Diabetes Father   . Heart attack  Father 36  . Breast cancer Sister   . Colon cancer Sister        mets to liver & lung  . Stroke Neg Hx     Social History Social History   Tobacco Use  . Smoking status: Former Smoker    Quit date: 12/05/1970    Years since quitting: 49.6  . Smokeless tobacco: Never Used  . Tobacco comment: smoked 1961-1972, up to 1 ppd  Substance Use Topics  . Alcohol use: Yes    Comment: minimally, 6 beers/year  . Drug use: No     Allergies   Iodinated diagnostic agents and Iohexol   Review of Systems Review of Systems  Reason unable to perform ROS: See HPI as above.     Physical Exam Triage Vital Signs ED Triage Vitals [07/06/20 1409]  Enc Vitals Group     BP (!) 155/71     Pulse Rate 74     Resp 18     Temp 98.3 F (36.8 C)     Temp Source Oral     SpO2 96 %     Weight      Height      Head Circumference      Peak Flow      Pain Score 0     Pain Loc      Pain Edu?      Excl. in [place]?    No data found.  Updated Vital Signs BP (!) 155/71 (BP Location: Left Arm)   Pulse 74   Temp 98.3 F (36.8 C) (Oral)   Resp 18   SpO2 96%   Physical Exam Constitutional:      General: He is not in acute distress.    Appearance: Normal appearance. He is well-developed. He is not toxic-appearing or diaphoretic.  HENT:     Head: Normocephalic and atraumatic.  Eyes:     Conjunctiva/sclera: Conjunctivae normal.     Pupils: Pupils are equal, round, and reactive to light.  Pulmonary:     Effort: Pulmonary effort is normal. No respiratory distress.  Musculoskeletal:     Cervical back: Normal range of motion and neck supple.  Skin:    General: Skin is warm and dry.     Comments: See picture below. Over grown tissue, though sutures still visible. No erythema, warmth.  Neurological:     Mental Status: He is alert and oriented to person, place, and time.         UC Treatments / Results  Labs (all labs ordered are listed, but only abnormal results are displayed) Labs  Reviewed - No data to display  EKG   Radiology No results found.  Procedures Procedures (including critical care time)  Medications Ordered in UC Medications - No data to display  Initial Impression / Assessment and Plan / UC Course  I have reviewed the triage vital signs and the nursing notes.  Pertinent labs & imaging results that were available during my care of the patient were reviewed by me and considered in my medical decision making (see chart for details).    9 sutures were removed. Few difficulties due to overgrown tissue with slight bleeding after suture removal. Patient tolerated procedure well without immediate complications.  Given appearance of wound, history of DM, will cover for infection with short course of keflex. Wound care instructions given. Return precautions given.  Final Clinical Impressions(s) / UC Diagnoses   Final diagnoses:  Visit for suture removal  Visit for wound check    ED Prescriptions    Medication Sig Dispense Auth. Provider   cephALEXin (KEFLEX) 500 MG capsule Take 1 capsule (500 mg total) by mouth 4 (four) times daily. 20 capsule Ok Edwards, PA-C     PDMP not reviewed this encounter.   Ok Edwards, PA-C 07/07/20 [phone]

## 2020-07-06 NOTE — Discharge Instructions (Signed)
Start keflex as directed. You can remove current dressing in 24 hours. Keep wound clean and dry. You can clean gently with soap and water. Do not soak area in water. Monitor for spreading redness, increased warmth, increased swelling, fever, follow up for reevaluation needed.

## 2020-07-06 NOTE — ED Triage Notes (Signed)
Pt states had 9 sutures placed to lt knee 16 days ago at the [facility] in [facility]. States cut himself with hedge trimmers. Received a Tetanus shot at that time.

## 2020-09-18 ENCOUNTER — Other Ambulatory Visit: Payer: Self-pay | Admitting: Internal Medicine

## 2020-10-23 ENCOUNTER — Other Ambulatory Visit: Payer: Self-pay | Admitting: Internal Medicine

## 2020-11-11 ENCOUNTER — Encounter: Payer: Self-pay | Admitting: Internal Medicine

## 2020-11-11 ENCOUNTER — Ambulatory Visit (INDEPENDENT_AMBULATORY_CARE_PROVIDER_SITE_OTHER): Payer: Medicare Other | Admitting: Internal Medicine

## 2020-11-11 ENCOUNTER — Other Ambulatory Visit: Payer: Self-pay

## 2020-11-11 VITALS — BP 130/70 | HR 66 | Temp 98.2°F | Ht 69.5 in | Wt 202.0 lb

## 2020-11-11 DIAGNOSIS — I10 Essential (primary) hypertension: Secondary | ICD-10-CM

## 2020-11-11 DIAGNOSIS — E1159 Type 2 diabetes mellitus with other circulatory complications: Secondary | ICD-10-CM | POA: Diagnosis not present

## 2020-11-11 DIAGNOSIS — N401 Enlarged prostate with lower urinary tract symptoms: Secondary | ICD-10-CM | POA: Diagnosis not present

## 2020-11-11 DIAGNOSIS — Z7189 Other specified counseling: Secondary | ICD-10-CM

## 2020-11-11 DIAGNOSIS — Z0000 Encounter for general adult medical examination without abnormal findings: Secondary | ICD-10-CM

## 2020-11-11 DIAGNOSIS — M8949 Other hypertrophic osteoarthropathy, multiple sites: Secondary | ICD-10-CM | POA: Diagnosis not present

## 2020-11-11 DIAGNOSIS — N138 Other obstructive and reflux uropathy: Secondary | ICD-10-CM

## 2020-11-11 DIAGNOSIS — M159 Polyosteoarthritis, unspecified: Secondary | ICD-10-CM

## 2020-11-11 LAB — COMPREHENSIVE METABOLIC PANEL
ALT: 20 U/L (ref 0–53)
AST: 16 U/L (ref 0–37)
Albumin: 4.4 g/dL (ref 3.5–5.2)
Alkaline Phosphatase: 56 U/L (ref 39–117)
BUN: 14 mg/dL (ref 6–23)
CO2: 29 mEq/L (ref 19–32)
Calcium: 9.8 mg/dL (ref 8.4–10.5)
Chloride: 103 mEq/L (ref 96–112)
Creatinine, Ser: 1.12 mg/dL (ref 0.40–1.50)
GFR: 62.98 mL/min (ref >60.00)
Glucose, Bld: 133 mg/dL — ABNORMAL HIGH (ref 70–99)
Potassium: 4.5 mEq/L (ref 3.5–5.1)
Sodium: 138 mEq/L (ref 135–145)
Total Bilirubin: 0.6 mg/dL (ref 0.2–1.2)
Total Protein: 7.3 g/dL (ref 6.0–8.3)

## 2020-11-11 LAB — LIPID PANEL
Cholesterol: 117 mg/dL (ref 0–200)
HDL: 42.9 mg/dL (ref >39.00)
LDL Cholesterol: 52 mg/dL (ref 0–99)
NonHDL: 74.5
Total CHOL/HDL Ratio: 3
Triglycerides: 115 mg/dL (ref 0.0–149.0)
VLDL: 23 mg/dL (ref 0.0–40.0)

## 2020-11-11 LAB — CBC
HCT: 44.1 % (ref 39.0–52.0)
Hemoglobin: 15 g/dL (ref 13.0–17.0)
MCHC: 34 g/dL (ref 30.0–36.0)
MCV: 91.6 fl (ref 78.0–100.0)
Platelets: 164 10*3/uL (ref 150.0–400.0)
RBC: 4.82 Mil/uL (ref 4.22–5.81)
RDW: 13.9 % (ref 11.5–15.5)
WBC: 6.2 10*3/uL (ref 4.0–10.5)

## 2020-11-11 LAB — HEMOGLOBIN A1C: Hgb A1c MFr Bld: 7 % — ABNORMAL HIGH (ref 4.6–6.5)

## 2020-11-11 LAB — HM DIABETES FOOT EXAM

## 2020-11-11 NOTE — Assessment & Plan Note (Signed)
See social history 

## 2020-11-11 NOTE — Assessment & Plan Note (Signed)
More symptoms Discussed using tylenol more regularly

## 2020-11-11 NOTE — Assessment & Plan Note (Signed)
Seems to still have good control on the metformin On statin as well HTN and CAD

## 2020-11-11 NOTE — Progress Notes (Signed)
Hearing Screening   Method: Audiometry   125Hz  250Hz  500Hz  1000Hz  2000Hz  3000Hz  4000Hz  6000Hz  8000Hz   Right ear:   20 20 20  20     Left ear:   0 0 20  0    Vision Screening Comments: December 2021

## 2020-11-11 NOTE — Assessment & Plan Note (Signed)
BP Readings from Last 3 Encounters:  11/11/20 130/70  07/06/20 (!) 155/71  04/28/20 126/82   Good control on metoprolol and quinapril  Also amlodipine

## 2020-11-11 NOTE — Assessment & Plan Note (Signed)
More symptoms May need to consider trial of tamsulosin soon

## 2020-11-11 NOTE — Progress Notes (Signed)
Subjective:    Patient ID: Peter Blair, male    DOB: December 25, 1940, 79 y.o.   MRN: [id number]  HPI Here for Medicare wellness visit and follow up of chronic health conditions This visit occurred during the SARS-CoV-2 public health emergency.  Safety protocols were in place, including screening questions prior to the visit, additional usage of staff PPE, and extensive cleaning of exam room while observing appropriate contact time as indicated for disinfecting solutions.   Reviewed form and advanced directives Reviewed other doctors No alcohol or tobacco Walks regularly and does yard work. Uses resistance bands as well Went to eye doctor yesterday--has turned in eyelid----scheduled for surgery. May need cataracts as well Having left ear problems---will pops and feels there is a sore there No falls No depression or anhedonia Independent with instrumental ADLs No sig memory issues  Checks sugars most days Average fasting 135 and premeal 105 No foot numbness, tingling or pain. Has some right lateral foot pain No foot ulcers or sores  Does check BP at home--average 140/71 No chest pain or SOB No palpitations No dizziness or syncope No edema Rare headache  Minor aches and pains No regular meds for this Knees are bad after walking Right shoulder also at times  Takes the omeprazole as needed Usually about every other day No dysphagia lately  Urinary urgency more often now Usually at night During day--the flow is okay Had slight dysuria for 1 day 3 weeks ago--not persistent  Current Outpatient Medications on File Prior to Visit  Medication Sig Dispense Refill  . Accu-Chek FastClix Lancets MISC USE  1 TO CHECK GLUCOSE ONCE DAILY 102 each 3  . amLODipine (NORVASC) 10 MG tablet TAKE ONE TABLET BY MOUTH ONE TIME DAILY  90 tablet 3  . aspirin 81 MG tablet Take 81 mg by mouth daily.      . Coenzyme Q10 200 MG TABS Take 1 tablet by mouth daily.   0  . glucose blood (ACCU-CHEK  GUIDE) test strip USE  ONCE DAILY 100 each 3  . metFORMIN (GLUCOPHAGE) 500 MG tablet Take 1 tablet (500 mg total) by mouth 2 (two) times daily with a meal. 180 tablet 3  . metoprolol tartrate (LOPRESSOR) 25 MG tablet TAKE ONE TABLET BY MOUTH TWICE DAILY  180 tablet 3  . omeprazole (PRILOSEC) 20 MG capsule Take 20 mg by mouth every other day.    . quinapril (ACCUPRIL) 40 MG tablet TAKE ONE TABLET BY MOUTH EVERY DAY 360 tablet 0  . rosuvastatin (CRESTOR) 10 MG tablet Take 1 tablet (10 mg total) by mouth at bedtime. 90 tablet 3  . sildenafil (REVATIO) 20 MG tablet TAKE THREE TO FIVE TABLETS BY MOUTH AS DIRECTED 50 tablet 11   No current facility-administered medications on file prior to visit.    Allergies  Allergen Reactions  . Iodinated Diagnostic Agents     Other reaction(s): NAUSEA  . Iohexol     Itchy eyes, dry mouth in early 1990s during imaging for renal calculi    Past Medical History:  Diagnosis Date  . Coronary artery disease    exertional chest pain, cath 12/10 showed EF 50-55% with mild inferior hypokensis, 99% proximal and 60% distal RCA stenosis, 80% prox stenosis of a moderate sized D1, 40% prox LAD. Patient had BMS x2 placed in RCA. Unstable Angina. PROMUS DES to the prox RCA  . Diabetes mellitus without complication ([place])   . Erectile dysfunction   . GERD (gastroesophageal reflux disease)   .  Hyperlipidemia   . Hypertension   . Nephrolithiasis    Dr. Jacqlyn Larsen  . Renal cyst   . Right shoulder pain    rotator cuff  . Skin cancer    Skin cancer, PMH of, cell type unknown, Dr. [place] Crane    Past Surgical History:  Procedure Laterality Date  . colonoscopy with ploypectomy  03/2012    Dr Sharlett Iles, GI  . CORONARY ANGIOPLASTY WITH STENT PLACEMENT     X 3 total stents  . cystoscopic removal  04/2010  . Flexibe sigmoidoscopy   2001  . LITHOTRIPSY     x 1    Family History  Problem Relation Age of Onset  . Breast cancer Mother   . Cervical cancer Mother   . Diabetes  Father   . Heart attack Father 42  . Breast cancer Sister   . Colon cancer Sister        mets to liver & lung  . Stroke Neg Hx     Social History   Socioeconomic History  . Marital status: Married    Spouse name: Not on file  . Number of children: 0  . Years of education: Not on file  . Highest education level: Not on file  Occupational History  . Occupation: Occupational hygienist    Comment: Retired  Tobacco Use  . Smoking status: Former Smoker    Quit date: 12/05/1970    Years since quitting: 49.9  . Smokeless tobacco: Never Used  . Tobacco comment: smoked 1961-1972, up to 1 ppd  Substance and Sexual Activity  . Alcohol use: Yes    Comment: minimally, 6 beers/year  . Drug use: No  . Sexual activity: Not on file  Other Topics Concern  . Not on file  Social History Narrative   Has living will    Wife is health care POA-- no alternate set up (??niece or sister)   Would accept resuscitation attempts   No extended tube feedings if cognitively unaware   Social Determinants of Health   Financial Resource Strain:   . Difficulty of Paying Living Expenses: Not on file  Food Insecurity:   . Worried About Charity fundraiser in the Last Year: Not on file  . Ran Out of Food in the Last Year: Not on file  Transportation Needs:   . Lack of Transportation (Medical): Not on file  . Lack of Transportation (Non-Medical): Not on file  Physical Activity:   . Days of Exercise per Week: Not on file  . Minutes of Exercise per Session: Not on file  Stress:   . Feeling of Stress : Not on file  Social Connections:   . Frequency of Communication with Friends and Family: Not on file  . Frequency of Social Gatherings with Friends and Family: Not on file  . Attends Religious Services: Not on file  . Active Member of Clubs or Organizations: Not on file  . Attends Archivist Meetings: Not on file  . Marital Status: Not on file  Intimate Partner Violence:   . Fear of Current or  Ex-Partner: Not on file  . Emotionally Abused: Not on file  . Physically Abused: Not on file  . Sexually Abused: Not on file    Review of Systems  appetite is okay Weight is up slightly Sleeps okay Wears seat belt Teeth are okay---keeps up with dentist Needs to set up with dermatologist--still has abnormal area on right arm Bowels are fine--no blood  Objective:   Physical Exam Constitutional:      Appearance: Normal appearance.  HENT:     Mouth/Throat:     Comments: No lesions Eyes:     Conjunctiva/sclera: Conjunctivae normal.     Pupils: Pupils are equal, round, and reactive to light.  Cardiovascular:     Rate and Rhythm: Normal rate and regular rhythm.     Pulses: Normal pulses.     Heart sounds: No murmur heard.  No gallop.   Pulmonary:     Effort: Pulmonary effort is normal.     Breath sounds: Normal breath sounds. No wheezing or rales.  Abdominal:     Palpations: Abdomen is soft.     Tenderness: There is no abdominal tenderness.  Musculoskeletal:     Cervical back: Neck supple.     Right lower leg: No edema.     Left lower leg: No edema.  Lymphadenopathy:     Cervical: No cervical adenopathy.  Skin:    Findings: No rash.     Comments: Scattered scaly areas--nothing cancerous. He will set up with derm Slight plantar callous---no other foot lesions  Neurological:     Mental Status: He is alert and oriented to person, place, and time.     Comments: President---"Biden, Trump, Obama" [id number] Recall 3/3  Normal sensation in feet  Psychiatric:        Mood and Affect: Mood normal.        Behavior: Behavior normal.            Assessment & Plan:

## 2020-11-11 NOTE — Assessment & Plan Note (Signed)
I have personally reviewed the Medicare Annual Wellness questionnaire and have noted 1. The patient's medical and social history 2. Their use of alcohol, tobacco or illicit drugs 3. Their current medications and supplements 4. The patient's functional ability including ADL's, fall risks, home safety risks and hearing or visual             impairment. 5. Diet and physical activities 6. Evidence for depression or mood disorders  The patients weight, height, BMI and visual acuity have been recorded in the chart I have made referrals, counseling and provided education to the patient based review of the above and I have provided the pt with a written personalized care plan for preventive services.  I have provided you with a copy of your personalized plan for preventive services. Please take the time to review along with your updated medication list.  Done with cancer screening Discussed exercise Had COVID booster and flu vaccine Recent Td

## 2020-12-08 ENCOUNTER — Other Ambulatory Visit: Payer: Self-pay | Admitting: Internal Medicine

## 2020-12-29 ENCOUNTER — Other Ambulatory Visit: Payer: Self-pay | Admitting: Internal Medicine

## 2021-01-26 ENCOUNTER — Other Ambulatory Visit: Payer: Self-pay | Admitting: Internal Medicine

## 2021-02-22 ENCOUNTER — Other Ambulatory Visit: Payer: Self-pay | Admitting: Internal Medicine

## 2021-04-05 ENCOUNTER — Other Ambulatory Visit: Payer: Self-pay | Admitting: Internal Medicine

## 2021-05-18 ENCOUNTER — Other Ambulatory Visit: Payer: Self-pay

## 2021-05-18 ENCOUNTER — Encounter: Payer: Self-pay | Admitting: Internal Medicine

## 2021-05-18 ENCOUNTER — Ambulatory Visit (INDEPENDENT_AMBULATORY_CARE_PROVIDER_SITE_OTHER): Payer: Medicare Other | Admitting: Internal Medicine

## 2021-05-18 ENCOUNTER — Ambulatory Visit: Payer: Medicare Other | Admitting: Internal Medicine

## 2021-05-18 VITALS — BP 120/76 | HR 74 | Temp 97.7°F | Ht 70.0 in | Wt 202.0 lb

## 2021-05-18 DIAGNOSIS — I25119 Atherosclerotic heart disease of native coronary artery with unspecified angina pectoris: Secondary | ICD-10-CM

## 2021-05-18 DIAGNOSIS — E1159 Type 2 diabetes mellitus with other circulatory complications: Secondary | ICD-10-CM | POA: Diagnosis not present

## 2021-05-18 DIAGNOSIS — I10 Essential (primary) hypertension: Secondary | ICD-10-CM

## 2021-05-18 LAB — POCT GLYCOSYLATED HEMOGLOBIN (HGB A1C): Hemoglobin A1C: 7 % — AB (ref 4.0–5.6)

## 2021-05-18 NOTE — Progress Notes (Signed)
Subjective:    Patient ID: Peter Blair, male    DOB: 06-11-1941, 80 y.o.   MRN: [id number]  HPI Here for follow up of diabetes and other chronic health conditions This visit occurred during the SARS-CoV-2 public health emergency.  Safety protocols were in place, including screening questions prior to the visit, additional usage of staff PPE, and extensive cleaning of exam room while observing appropriate contact time as indicated for disinfecting solutions.   Checks sugars regularly They are up a bit--average fasting is 147 and pre-meal 124 No hypoglycemic reactions No foot numbness or pain  Still tries to walk Limited some by knee "I just don't have no energy"---gets nauseated and worn out with exertion, especially in the heat No chest pain or SOB If he rests, he gets right back to it  No dizziness or syncope No edema  Current Outpatient Medications on File Prior to Visit  Medication Sig Dispense Refill   Accu-Chek FastClix Lancets MISC USE  1 TO CHECK GLUCOSE ONCE DAILY 102 each 3   amLODipine (NORVASC) 10 MG tablet TAKE ONE TABLET BY MOUTH ONE TIME DAILY 90 tablet 3   aspirin 81 MG tablet Take 81 mg by mouth daily.       Coenzyme Q10 200 MG TABS Take 1 tablet by mouth daily.   0   glucose blood (ACCU-CHEK GUIDE) test strip USE  ONCE DAILY 100 each 3   metFORMIN (GLUCOPHAGE) 500 MG tablet TAKE 1 TABLET BY MOUTH TWICE A DAY 180 tablet 3   metoprolol tartrate (LOPRESSOR) 25 MG tablet TAKE ONE TABLET BY MOUTH TWICE DAILY 180 tablet 3   omeprazole (PRILOSEC) 20 MG capsule Take 20 mg by mouth every other day.     quinapril (ACCUPRIL) 40 MG tablet TAKE ONE TABLET BY MOUTH EVERY DAY 360 tablet 0   rosuvastatin (CRESTOR) 10 MG tablet TAKE 1 TABLET BY MOUTH EVERYDAY AT BEDTIME 90 tablet 3   sildenafil (REVATIO) 20 MG tablet take 3 to 5 tablets by mouth as directed 50 tablet 5   No current facility-administered medications on file prior to visit.    Allergies  Allergen Reactions    Iodinated Diagnostic Agents     Other reaction(s): NAUSEA   Iohexol     Itchy eyes, dry mouth in early 1990s during imaging for renal calculi    Past Medical History:  Diagnosis Date   Coronary artery disease    exertional chest pain, cath 12/10 showed EF 50-55% with mild inferior hypokensis, 99% proximal and 60% distal RCA stenosis, 80% prox stenosis of a moderate sized D1, 40% prox LAD. Patient had BMS x2 placed in RCA. Unstable Angina. PROMUS DES to the prox RCA   Diabetes mellitus without complication ([facility])    Erectile dysfunction    GERD (gastroesophageal reflux disease)    Hyperlipidemia    Hypertension    Nephrolithiasis    Dr. Jacqlyn Larsen   Renal cyst    Right shoulder pain    rotator cuff   Skin cancer    Skin cancer, PMH of, cell type unknown, Dr. [place] Crane    Past Surgical History:  Procedure Laterality Date   colonoscopy with ploypectomy  03/2012    Dr Sharlett Iles, GI   CORONARY ANGIOPLASTY WITH STENT PLACEMENT     X 3 total stents   cystoscopic removal  04/2010   Flexibe sigmoidoscopy   2001   LITHOTRIPSY     x 1    Family History  Problem Relation Age of  Onset   Breast cancer Mother    Cervical cancer Mother    Diabetes Father    Heart attack Father 71   Breast cancer Sister    Colon cancer Sister        mets to liver & lung   Stroke Neg Hx     Social History   Socioeconomic History   Marital status: Married    Spouse name: Not on file   Number of children: 0   Years of education: Not on file   Highest education level: Not on file  Occupational History   Occupation: Occupational hygienist    Comment: Retired  Tobacco Use   Smoking status: Former    Pack years: 0.00    Types: Cigarettes    Quit date: 12/05/1970    Years since quitting: 50.4   Smokeless tobacco: Never   Tobacco comments:    smoked 1961-1972, up to 1 ppd  Substance and Sexual Activity   Alcohol use: Yes    Comment: minimally, 6 beers/year   Drug use: No   Sexual activity: Not on file   Other Topics Concern   Not on file  Social History Narrative   Has living will    Wife is health care POA-- no alternate set up (??niece or sister)   Would accept resuscitation attempts   No extended tube feedings if cognitively unaware   Social Determinants of Health   Financial Resource Strain: Not on file  Food Insecurity: Not on file  Transportation Needs: Not on file  Physical Activity: Not on file  Stress: Not on file  Social Connections: Not on file  Intimate Partner Violence: Not on file   Review of Systems Appetite is good Weight is stable Usually sleeps okay     Objective:   Physical Exam Constitutional:      Appearance: Normal appearance.  Cardiovascular:     Rate and Rhythm: Normal rate and regular rhythm.     Pulses: Normal pulses.     Heart sounds: No murmur heard.   No gallop.  Pulmonary:     Effort: Pulmonary effort is normal.     Breath sounds: Normal breath sounds. No wheezing or rales.  Musculoskeletal:     Cervical back: Neck supple.     Right lower leg: No edema.     Left lower leg: No edema.  Lymphadenopathy:     Cervical: No cervical adenopathy.  Skin:    Findings: No rash.     Comments: Firm ~71mm mass subcutaneous on right calf (reassured--not a blood clot)  Neurological:     Mental Status: He is alert.  Psychiatric:        Mood and Affect: Mood normal.        Thought Content: Thought content normal.           Assessment & Plan:

## 2021-05-18 NOTE — Assessment & Plan Note (Signed)
Lab Results  Component Value Date   HGBA1C 7.0 (A) 05/18/2021   Still with good control on the metformin No change needed

## 2021-05-18 NOTE — Assessment & Plan Note (Signed)
BP Readings from Last 3 Encounters:  05/18/21 120/76  11/11/20 130/70  07/06/20 (!) 155/71   Controlled on meds for his heart plus amlodipine

## 2021-05-18 NOTE — Assessment & Plan Note (Signed)
His nausea and easy fatigue are consistent with his past angina Continues on quinapril, metoprolol, statin and ASA Will set back up with cardiology

## 2021-10-25 ENCOUNTER — Other Ambulatory Visit: Payer: Self-pay | Admitting: Internal Medicine

## 2021-11-17 ENCOUNTER — Other Ambulatory Visit: Payer: Self-pay

## 2021-11-17 ENCOUNTER — Encounter: Payer: Self-pay | Admitting: Internal Medicine

## 2021-11-17 ENCOUNTER — Ambulatory Visit (INDEPENDENT_AMBULATORY_CARE_PROVIDER_SITE_OTHER): Payer: Medicare Other | Admitting: Internal Medicine

## 2021-11-17 VITALS — BP 134/82 | HR 70 | Temp 98.2°F | Ht 70.0 in | Wt 203.0 lb

## 2021-11-17 DIAGNOSIS — I10 Essential (primary) hypertension: Secondary | ICD-10-CM

## 2021-11-17 DIAGNOSIS — I25119 Atherosclerotic heart disease of native coronary artery with unspecified angina pectoris: Secondary | ICD-10-CM | POA: Diagnosis not present

## 2021-11-17 DIAGNOSIS — E1159 Type 2 diabetes mellitus with other circulatory complications: Secondary | ICD-10-CM | POA: Diagnosis not present

## 2021-11-17 DIAGNOSIS — K219 Gastro-esophageal reflux disease without esophagitis: Secondary | ICD-10-CM | POA: Diagnosis not present

## 2021-11-17 LAB — COMPREHENSIVE METABOLIC PANEL
ALT: 19 U/L (ref 0–53)
AST: 15 U/L (ref 0–37)
Albumin: 4.3 g/dL (ref 3.5–5.2)
Alkaline Phosphatase: 64 U/L (ref 39–117)
BUN: 17 mg/dL (ref 6–23)
CO2: 28 mEq/L (ref 19–32)
Calcium: 9.7 mg/dL (ref 8.4–10.5)
Chloride: 103 mEq/L (ref 96–112)
Creatinine, Ser: 1.13 mg/dL (ref 0.40–1.50)
GFR: 61.87 mL/min (ref >60.00)
Glucose, Bld: 158 mg/dL — ABNORMAL HIGH (ref 70–99)
Potassium: 4.6 mEq/L (ref 3.5–5.1)
Sodium: 140 mEq/L (ref 135–145)
Total Bilirubin: 0.6 mg/dL (ref 0.2–1.2)
Total Protein: 7 g/dL (ref 6.0–8.3)

## 2021-11-17 LAB — HM DIABETES FOOT EXAM

## 2021-11-17 LAB — CBC
HCT: 44 % (ref 39.0–52.0)
Hemoglobin: 15 g/dL (ref 13.0–17.0)
MCHC: 34 g/dL (ref 30.0–36.0)
MCV: 92.4 fl (ref 78.0–100.0)
Platelets: 157 10*3/uL (ref 150.0–400.0)
RBC: 4.76 Mil/uL (ref 4.22–5.81)
RDW: 13.5 % (ref 11.5–15.5)
WBC: 6 10*3/uL (ref 4.0–10.5)

## 2021-11-17 LAB — LIPID PANEL
Cholesterol: 123 mg/dL (ref 0–200)
HDL: 42.6 mg/dL (ref >39.00)
LDL Cholesterol: 47 mg/dL (ref 0–99)
NonHDL: 80.09
Total CHOL/HDL Ratio: 3
Triglycerides: 167 mg/dL — ABNORMAL HIGH (ref 0.0–149.0)
VLDL: 33.4 mg/dL (ref 0.0–40.0)

## 2021-11-17 LAB — HEMOGLOBIN A1C: Hgb A1c MFr Bld: 7.3 % — ABNORMAL HIGH (ref 4.6–6.5)

## 2021-11-17 MED ORDER — LISINOPRIL 40 MG PO TABS: 40.0000 mg | ORAL_TABLET | Freq: Every day | ORAL | 3 refills | Status: DC

## 2021-11-17 NOTE — Assessment & Plan Note (Signed)
BP Readings from Last 3 Encounters:  11/17/21 134/82  05/18/21 120/76  11/11/20 130/70   Doing well with quinapril (will change to lisinopril due to inability to get this), metoprolol and amlodipine

## 2021-11-17 NOTE — Progress Notes (Signed)
Subjective:    Patient ID: Peter Blair, male    DOB: December 25, 1940, 80 y.o.   MRN: [id number]  HPI Here for follow up of diabetes and other chronic health conditions  Sugars have been about the same Average is 142 Checks most mornings No low sugar reactions No foot pain, numbness or burning  Was contacted by cardiologist office [place] Will still get lightheaded or nauseated at times--mostly if bending over Will get tired when cleaning his boat--especially if hot Wears out doing yard work---but no change  Running out of the quinapril Can't find anymore--needs a replacement  Current Outpatient Medications on File Prior to Visit  Medication Sig Dispense Refill   Accu-Chek FastClix Lancets MISC USE  1 TO CHECK GLUCOSE ONCE DAILY 102 each 3   amLODipine (NORVASC) 10 MG tablet TAKE ONE TABLET BY MOUTH ONE TIME DAILY 90 tablet 3   aspirin 81 MG tablet Take 81 mg by mouth daily.       Coenzyme Q10 200 MG TABS Take 1 tablet by mouth daily.   0   glucose blood (ACCU-CHEK GUIDE) test strip USE  ONCE DAILY 100 each 3   metFORMIN (GLUCOPHAGE) 500 MG tablet TAKE 1 TABLET BY MOUTH TWICE A DAY 180 tablet 3   metoprolol tartrate (LOPRESSOR) 25 MG tablet TAKE ONE TABLET BY MOUTH TWICE DAILY 180 tablet 3   omeprazole (PRILOSEC) 20 MG capsule Take 20 mg by mouth every other day.     quinapril (ACCUPRIL) 40 MG tablet TAKE ONE TABLET BY MOUTH EVERY DAY 360 tablet 0   rosuvastatin (CRESTOR) 10 MG tablet TAKE 1 TABLET BY MOUTH EVERYDAY AT BEDTIME 90 tablet 3   sildenafil (REVATIO) 20 MG tablet take 3 to 5 tablets by mouth as directed 50 tablet 5   No current facility-administered medications on file prior to visit.    Allergies  Allergen Reactions   Iodinated Diagnostic Agents     Other reaction(s): NAUSEA   Iohexol     Itchy eyes, dry mouth in early 1990s during imaging for renal calculi    Past Medical History:  Diagnosis Date    Coronary artery disease    exertional chest pain, cath 12/10 showed EF 50-55% with mild inferior hypokensis, 99% proximal and 60% distal RCA stenosis, 80% prox stenosis of a moderate sized D1, 40% prox LAD. Patient had BMS x2 placed in RCA. Unstable Angina. PROMUS DES to the prox RCA   Diabetes mellitus without complication ([facility])    Erectile dysfunction    GERD (gastroesophageal reflux disease)    Hyperlipidemia    Hypertension    Nephrolithiasis    Dr. Jacqlyn Larsen   Renal cyst    Right shoulder pain    rotator cuff   Skin cancer    Skin cancer, PMH of, cell type unknown, Dr. [place] Crane    Past Surgical History:  Procedure Laterality Date   colonoscopy with ploypectomy  03/2012    Dr Sharlett Iles, GI   CORONARY ANGIOPLASTY WITH STENT PLACEMENT     X 3 total stents   cystoscopic removal  04/2010   Flexibe sigmoidoscopy   2001   LITHOTRIPSY     x 1    Family History  Problem Relation Age of Onset   Breast cancer Mother    Cervical cancer Mother    Diabetes Father    Heart attack Father 61   Breast cancer Sister    Colon cancer  Sister        mets to liver & lung   Stroke Neg Hx     Social History   Socioeconomic History   Marital status: Married    Spouse name: Not on file   Number of children: 0   Years of education: Not on file   Highest education level: Not on file  Occupational History   Occupation: Occupational hygienist    Comment: Retired  Tobacco Use   Smoking status: Former    Types: Cigarettes    Quit date: 12/05/1970    Years since quitting: 50.9   Smokeless tobacco: Never   Tobacco comments:    smoked 1961-1972, up to 1 ppd  Substance and Sexual Activity   Alcohol use: Yes    Comment: minimally, 6 beers/year   Drug use: No   Sexual activity: Not on file  Other Topics Concern   Not on file  Social History Narrative   Has living will    Wife is health care POA-- no alternate set up (??niece or sister)   Would accept resuscitation attempts   No extended tube  feedings if cognitively unaware   Social Determinants of Health   Financial Resource Strain: Not on file  Food Insecurity: Not on file  Transportation Needs: Not on file  Physical Activity: Not on file  Stress: Not on file  Social Connections: Not on file  Intimate Partner Violence: Not on file   Review of Systems Appetite is fine Weight is stable Sleeps okay     Objective:   Physical Exam Constitutional:      Appearance: Normal appearance.  Cardiovascular:     Rate and Rhythm: Normal rate and regular rhythm.     Pulses: Normal pulses.     Heart sounds: No murmur heard.   No gallop.  Pulmonary:     Effort: Pulmonary effort is normal.     Breath sounds: Normal breath sounds. No wheezing or rales.  Musculoskeletal:     Cervical back: Neck supple.     Right lower leg: No edema.     Left lower leg: No edema.  Lymphadenopathy:     Cervical: No cervical adenopathy.  Skin:    Comments: No foot lesions  Neurological:     Mental Status: He is alert.     Comments: Normal sensation in plantar feet           Assessment & Plan:

## 2021-11-17 NOTE — Assessment & Plan Note (Signed)
Seems to still have acceptable control with just metformin Will check labs

## 2021-11-17 NOTE — Assessment & Plan Note (Signed)
Does okay on the omeprazole

## 2021-11-17 NOTE — Assessment & Plan Note (Signed)
Still seems to have a stable angina pattern--fatigue with exercise No change Will hold off on testing unless worsens ASA, statin, metoprolol, ACEI

## 2021-12-07 ENCOUNTER — Other Ambulatory Visit: Payer: Self-pay | Admitting: Internal Medicine

## 2021-12-08 LAB — HM DIABETES EYE EXAM

## 2021-12-13 ENCOUNTER — Telehealth: Payer: Medicare Other | Admitting: Nurse Practitioner

## 2021-12-28 ENCOUNTER — Other Ambulatory Visit: Payer: Self-pay | Admitting: Internal Medicine

## 2022-01-16 ENCOUNTER — Other Ambulatory Visit: Payer: Self-pay | Admitting: Internal Medicine

## 2022-03-01 ENCOUNTER — Other Ambulatory Visit: Payer: Self-pay | Admitting: Internal Medicine

## 2022-04-12 ENCOUNTER — Other Ambulatory Visit: Payer: Self-pay | Admitting: Internal Medicine

## 2022-05-18 ENCOUNTER — Encounter: Payer: Self-pay | Admitting: Internal Medicine

## 2022-05-18 ENCOUNTER — Ambulatory Visit (INDEPENDENT_AMBULATORY_CARE_PROVIDER_SITE_OTHER): Payer: Medicare Other | Admitting: Internal Medicine

## 2022-05-18 VITALS — BP 138/68 | HR 68 | Temp 97.6°F | Ht 70.0 in | Wt 201.0 lb

## 2022-05-18 DIAGNOSIS — E1159 Type 2 diabetes mellitus with other circulatory complications: Secondary | ICD-10-CM | POA: Diagnosis not present

## 2022-05-18 DIAGNOSIS — Z7189 Other specified counseling: Secondary | ICD-10-CM

## 2022-05-18 DIAGNOSIS — Z0000 Encounter for general adult medical examination without abnormal findings: Secondary | ICD-10-CM

## 2022-05-18 DIAGNOSIS — I10 Essential (primary) hypertension: Secondary | ICD-10-CM | POA: Diagnosis not present

## 2022-05-18 DIAGNOSIS — I25119 Atherosclerotic heart disease of native coronary artery with unspecified angina pectoris: Secondary | ICD-10-CM

## 2022-05-18 DIAGNOSIS — M159 Polyosteoarthritis, unspecified: Secondary | ICD-10-CM

## 2022-05-18 LAB — COMPREHENSIVE METABOLIC PANEL
ALT: 18 U/L (ref 0–53)
AST: 14 U/L (ref 0–37)
Albumin: 4.3 g/dL (ref 3.5–5.2)
Alkaline Phosphatase: 58 U/L (ref 39–117)
BUN: 16 mg/dL (ref 6–23)
CO2: 27 mEq/L (ref 19–32)
Calcium: 9.7 mg/dL (ref 8.4–10.5)
Chloride: 104 mEq/L (ref 96–112)
Creatinine, Ser: 0.99 mg/dL (ref 0.40–1.50)
GFR: 72.26 mL/min (ref >60.00)
Glucose, Bld: 160 mg/dL — ABNORMAL HIGH (ref 70–99)
Potassium: 4.2 mEq/L (ref 3.5–5.1)
Sodium: 139 mEq/L (ref 135–145)
Total Bilirubin: 0.5 mg/dL (ref 0.2–1.2)
Total Protein: 6.9 g/dL (ref 6.0–8.3)

## 2022-05-18 LAB — CBC
HCT: 45.1 % (ref 39.0–52.0)
Hemoglobin: 15.4 g/dL (ref 13.0–17.0)
MCHC: 34 g/dL (ref 30.0–36.0)
MCV: 92.4 fl (ref 78.0–100.0)
Platelets: 154 10*3/uL (ref 150.0–400.0)
RBC: 4.89 Mil/uL (ref 4.22–5.81)
RDW: 13.4 % (ref 11.5–15.5)
WBC: 6.6 10*3/uL (ref 4.0–10.5)

## 2022-05-18 LAB — LIPID PANEL
Cholesterol: 124 mg/dL (ref 0–200)
HDL: 42 mg/dL (ref >39.00)
LDL Cholesterol: 50 mg/dL (ref 0–99)
NonHDL: 82.36
Total CHOL/HDL Ratio: 3
Triglycerides: 163 mg/dL — ABNORMAL HIGH (ref 0.0–149.0)
VLDL: 32.6 mg/dL (ref 0.0–40.0)

## 2022-05-18 LAB — HM DIABETES FOOT EXAM

## 2022-05-18 LAB — MICROALBUMIN / CREATININE URINE RATIO
Creatinine,U: 97.2 mg/dL
Microalb Creat Ratio: 3.8 mg/g (ref 0.0–30.0)
Microalb, Ur: 3.7 mg/dL — ABNORMAL HIGH (ref 0.0–1.9)

## 2022-05-18 LAB — HEMOGLOBIN A1C: Hgb A1c MFr Bld: 7.7 % — ABNORMAL HIGH (ref 4.6–6.5)

## 2022-05-18 NOTE — Assessment & Plan Note (Signed)
Discussed tylenol Try diclofenac gel for most painful areas

## 2022-05-18 NOTE — Progress Notes (Signed)
Subjective:    Patient ID: Peter Blair, male    DOB: 10/17/1941, 81 y.o.   MRN: [id number]  HPI Here for Medicare wellness visit and follow up of chronic health conditions Reviewed advanced directives Reviewed other doctors---Dr Hall---dermatologist, Dr Lyle--ophthal, Dr Bell--dentist No hospitalizations in past year. 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--but not recently Tylenol may help briefly Did fall off zero turn mower ----did increase achiness Also raised heavy garage door Left knee is bad---may need TKR  Does check sugars-- 1-2 per week Overall average up slightly ~140 Hasn't been exercising much No foot numbness or burning Some DOE --but stable  Some acid reflux if misses omeprazole too long Tends to use it in spells of a few days at a time No dysphagia  Current Outpatient Medications on File Prior to Visit  Medication Sig Dispense Refill   Accu-Chek FastClix Lancets MISC USE  1 TO CHECK GLUCOSE ONCE DAILY 102 each 0   amLODipine (NORVASC) 10 MG tablet TAKE ONE TABLET BY MOUTH ONE TIME DAILY 90 tablet 3   aspirin 81 MG tablet Take 81 mg by mouth daily.       Coenzyme Q10 200 MG TABS Take 1 tablet by mouth daily.   0   glucose blood (ACCU-CHEK GUIDE) test strip USE 1 STRIP ONCE DAILY 100 each 0   lisinopril (ZESTRIL) 40 MG tablet Take 1 tablet (40 mg total) by mouth daily. 90 tablet 3   metFORMIN (GLUCOPHAGE) 500 MG tablet TAKE 1 TABLET BY MOUTH TWICE A DAY 180 tablet 3   metoprolol tartrate (LOPRESSOR) 25 MG tablet TAKE ONE TABLET BY MOUTH TWICE DAILY 180 tablet 3   omeprazole (PRILOSEC) 20 MG capsule Take 20 mg by mouth daily as needed.     rosuvastatin (CRESTOR)  10 MG tablet TAKE 1 TABLET BY MOUTH EVERYDAY AT BEDTIME 90 tablet 3   sildenafil (REVATIO) 20 MG tablet TAKE 3 TO 5 TABLETS BY MOUTH DAILY AS DIRECTED 50 tablet 5   No current facility-administered medications on file prior to visit.    Allergies  Allergen Reactions   Iodinated Contrast Media     Other reaction(s): NAUSEA   Iohexol     Itchy eyes, dry mouth in early 1990s during imaging for renal calculi    Past Medical History:  Diagnosis Date   Coronary artery disease    exertional chest pain, cath 12/10 showed EF 50-55% with mild inferior hypokensis, 99% proximal and 60% distal RCA stenosis, 80% prox stenosis of a moderate sized D1, 40% prox LAD. Patient had BMS x2 placed in RCA. Unstable Angina. PROMUS DES to the prox RCA   Diabetes mellitus without complication (HCC)    Erectile dysfunction    GERD (gastroesophageal reflux disease)    Hyperlipidemia    Hypertension    Nephrolithiasis    Dr. Jacqlyn Larsen   Renal cyst    Right shoulder pain    rotator cuff   Skin cancer    Skin cancer, PMH of, cell type unknown, Dr. Nevada Crane    Past Surgical History:  Procedure Laterality Date   colonoscopy with ploypectomy  03/2012    Dr Sharlett Iles, GI   CORONARY ANGIOPLASTY WITH STENT PLACEMENT     X 3  total stents   cystoscopic removal  04/2010   Flexibe sigmoidoscopy   2001   LITHOTRIPSY     x 1    Family History  Problem Relation Age of Onset   Breast cancer Mother    Cervical cancer Mother    Diabetes Father    Heart attack Father 64   Breast cancer Sister    Colon cancer Sister        mets to liver & lung   Stroke Neg Hx     Social History   Socioeconomic History   Marital status: Married    Spouse name: Not on file   Number of children: 0   Years of education: Not on file   Highest education level: Not on file  Occupational History   Occupation: Occupational hygienist    Comment: Retired  Tobacco Use   Smoking status: Former    Types: Cigarettes    Quit date: 12/05/1970     Years since quitting: 51.4   Smokeless tobacco: Never   Tobacco comments:    smoked 1961-1972, up to 1 ppd  Substance and Sexual Activity   Alcohol use: Yes    Comment: minimally, 6 beers/year   Drug use: No   Sexual activity: Not on file  Other Topics Concern   Not on file  Social History Narrative   Has living will    Wife is health care POA-- no alternate set up (??niece or sister)   Would accept resuscitation attempts   No extended tube feedings if cognitively unaware   Social Determinants of Health   Financial Resource Strain: Not on file  Food Insecurity: Not on file  Transportation Needs: Not on file  Physical Activity: Not on file  Stress: Not on file  Social Connections: Not on file  Intimate Partner Violence: Not on file   Review of Systems Appetite is fine Weight is stable Sleeps fairly well Teeth okay Multiple AK's--lots of cryotherapy More itching lately--did change to plain DOve soap and anti itch lotion  Bowels move fine--no blood Urinary stream is slow--but is able empty. Nocturia x 2-3 No suspicious skin lesions now---other than his chronic stuff    Objective:   Physical Exam Constitutional:      Appearance: Normal appearance.  HENT:     Mouth/Throat:     Comments: No lesions Eyes:     Conjunctiva/sclera: Conjunctivae normal.     Pupils: Pupils are equal, round, and reactive to light.  Cardiovascular:     Rate and Rhythm: Normal rate and regular rhythm.     Pulses: Normal pulses.     Heart sounds: No murmur heard.    No gallop.  Pulmonary:     Effort: Pulmonary effort is normal.     Breath sounds: Normal breath sounds. No wheezing or rales.  Abdominal:     Palpations: Abdomen is soft.     Tenderness: There is no abdominal tenderness.  Musculoskeletal:     Cervical back: Neck supple.     Right lower leg: No edema.     Left lower leg: No edema.     Comments: Mild restriction in external rotation in shoulders and some crepitus Overall  fairly good motion and active abduction  Lymphadenopathy:     Cervical: No cervical adenopathy.  Skin:    Findings: No lesion or rash.     Comments: No foot lesions  Neurological:     General: No focal deficit present.     Mental Status: He  is alert and oriented to person, place, and time.     Comments: Normal sensation in feet Mini-cog normal  Psychiatric:        Mood and Affect: Mood normal.        Behavior: Behavior normal.            Assessment & Plan:

## 2022-05-18 NOTE — Assessment & Plan Note (Signed)
See social history 

## 2022-05-18 NOTE — Assessment & Plan Note (Signed)
I have personally reviewed the Medicare Annual Wellness questionnaire and have noted 1. The patient's medical and social history 2. Their use of alcohol, tobacco or illicit drugs 3. Their current medications and supplements 4. The patient's functional ability including ADL's, fall risks, home safety risks and hearing or visual             impairment. 5. Diet and physical activities 6. Evidence for depression or mood disorders  The patients weight, height, BMI and visual acuity have been recorded in the chart I have made referrals, counseling and provided education to the patient based review of the above and I have provided the pt with a written personalized care plan for preventive services.  I have provided you with a copy of your personalized plan for preventive services. Please take the time to review along with your updated medication list.  Done with cancer screening Discussed exercise--this is vital Update bivalent COVID and flu vaccines in the fall Consider shingrix at pharmacy

## 2022-05-18 NOTE — Assessment & Plan Note (Signed)
BP Readings from Last 3 Encounters:  05/18/22 138/68  11/17/21 134/82  05/18/21 120/76   Good control on heart meds

## 2022-05-18 NOTE — Patient Instructions (Signed)
You can try over the counter diclofenac gel for your shoulders and knees/hips.

## 2022-05-18 NOTE — Assessment & Plan Note (Signed)
Still seems to have good control on the metfomin 500 bid Will check levels

## 2022-05-18 NOTE — Assessment & Plan Note (Signed)
No change in DOE--may be conditioning  Discussed fitness efforts On metoprolol 25 bid, lisinopril '40mg'$  daily, ASA 81, rosuvastatin 10 Will check labs

## 2022-09-21 ENCOUNTER — Other Ambulatory Visit: Payer: Self-pay | Admitting: Internal Medicine

## 2022-11-07 ENCOUNTER — Other Ambulatory Visit: Payer: Self-pay | Admitting: Internal Medicine

## 2022-11-17 ENCOUNTER — Encounter: Payer: Self-pay | Admitting: Internal Medicine

## 2022-11-17 ENCOUNTER — Ambulatory Visit (INDEPENDENT_AMBULATORY_CARE_PROVIDER_SITE_OTHER): Payer: Medicare Other | Admitting: Internal Medicine

## 2022-11-17 VITALS — BP 114/70 | HR 73 | Temp 97.7°F | Ht 70.0 in | Wt 200.4 lb

## 2022-11-17 DIAGNOSIS — E1159 Type 2 diabetes mellitus with other circulatory complications: Secondary | ICD-10-CM

## 2022-11-17 LAB — POCT GLYCOSYLATED HEMOGLOBIN (HGB A1C): Hemoglobin A1C: 8 % — AB (ref 4.0–5.6)

## 2022-11-17 NOTE — Progress Notes (Signed)
Subjective:    Patient ID: Peter Blair, male    DOB: Sep 03, 1941, 81 y.o.   MRN: [id number]  HPI Here for follow up of diabetes  Doing okay Checking twice a week or so Average fasting 152----later 126 No diarrhea or GI problems  No chest pain or SOB Having more joint and muscle pains--not exercising as much No edema  Current Outpatient Medications on File Prior to Visit  Medication Sig Dispense Refill   Accu-Chek FastClix Lancets MISC USE  1 TO CHECK GLUCOSE ONCE DAILY 102 each 0   amLODipine (NORVASC) 10 MG tablet TAKE ONE TABLET BY MOUTH ONE TIME DAILY 90 tablet 3   aspirin 81 MG tablet Take 81 mg by mouth daily.       Coenzyme Q10 200 MG TABS Take 1 tablet by mouth daily.   0   glucose blood (ACCU-CHEK GUIDE) test strip USE 1 STRIP ONCE DAILY 100 each 0   lisinopril (ZESTRIL) 40 MG tablet TAKE 1 TABLET BY MOUTH EVERY DAY 90 tablet 3   metFORMIN (GLUCOPHAGE) 500 MG tablet TAKE 1 TABLET BY MOUTH TWICE A DAY 180 tablet 3   metoprolol tartrate (LOPRESSOR) 25 MG tablet TAKE ONE TABLET BY MOUTH TWICE DAILY 180 tablet 3   omeprazole (PRILOSEC) 20 MG capsule Take 20 mg by mouth daily as needed.     rosuvastatin (CRESTOR) 10 MG tablet TAKE 1 TABLET BY MOUTH EVERYDAY AT BEDTIME 90 tablet 3   sildenafil (REVATIO) 20 MG tablet TAKE 3 TO 5 TABLETS BY MOUTH DAILY AS DIRECTED 50 tablet 5   No current facility-administered medications on file prior to visit.    Allergies  Allergen Reactions   Iodinated Contrast Media     Other reaction(s): NAUSEA   Iohexol     Itchy eyes, dry mouth in early 1990s during imaging for renal calculi    Past Medical History:  Diagnosis Date   Coronary artery disease    exertional chest pain, cath 12/10 showed EF 50-55% with mild inferior hypokensis, 99% proximal and 60% distal RCA stenosis, 80% prox stenosis of a moderate sized D1, 40% prox LAD. Patient had BMS x2 placed in RCA. Unstable Angina. PROMUS DES to the prox RCA   Diabetes mellitus without  complication (HCC)    Erectile dysfunction    GERD (gastroesophageal reflux disease)    Hyperlipidemia    Hypertension    Nephrolithiasis    Dr. Jacqlyn Larsen   Renal cyst    Right shoulder pain    rotator cuff   Skin cancer    Skin cancer, PMH of, cell type unknown, Dr. [place] Crane    Past Surgical History:  Procedure Laterality Date   colonoscopy with ploypectomy  03/2012    Dr Sharlett Iles, GI   CORONARY ANGIOPLASTY WITH STENT PLACEMENT     X 3 total stents   cystoscopic removal  04/2010   Flexibe sigmoidoscopy   2001   LITHOTRIPSY     x 1    Family History  Problem Relation Age of Onset   Breast cancer Mother    Cervical cancer Mother    Diabetes Father    Heart attack Father 58   Breast cancer Sister    Colon cancer Sister        mets to liver & lung   Stroke Neg Hx     Social History   Socioeconomic History   Marital status: Married    Spouse name: Not on file   Number of children: 0  Years of education: Not on file   Highest education level: Not on file  Occupational History   Occupation: Occupational hygienist    Comment: Retired  Tobacco Use   Smoking status: Former    Types: Cigarettes    Quit date: 12/05/1970    Years since quitting: 51.9   Smokeless tobacco: Never   Tobacco comments:    smoked 1961-1972, up to 1 ppd  Substance and Sexual Activity   Alcohol use: Yes    Comment: minimally, 6 beers/year   Drug use: No   Sexual activity: Not on file  Other Topics Concern   Not on file  Social History Narrative   Has living will    Wife is health care POA-- no alternate set up (??niece or sister)   Would accept resuscitation attempts   No extended tube feedings if cognitively unaware   Social Determinants of Health   Financial Resource Strain: Not on file  Food Insecurity: Not on file  Transportation Needs: Not on file  Physical Activity: Not on file  Stress: Not on file  Social Connections: Not on file  Intimate Partner Violence: Not on file   Review of  Systems Not that compliant with proper eating Weight is stable Sleeps okay     Objective:   Physical Exam Constitutional:      Appearance: Normal appearance.  Cardiovascular:     Rate and Rhythm: Normal rate and regular rhythm.     Heart sounds: No murmur heard.    No gallop.  Pulmonary:     Effort: Pulmonary effort is normal.     Breath sounds: Normal breath sounds. No wheezing or rales.  Musculoskeletal:     Cervical back: Neck supple.  Lymphadenopathy:     Cervical: No cervical adenopathy.  Neurological:     Mental Status: He is alert.  Psychiatric:        Mood and Affect: Mood normal.        Behavior: Behavior normal.            Assessment & Plan:

## 2022-11-17 NOTE — Assessment & Plan Note (Addendum)
On metformin 500 bid---he is concerned it may be causing him muscle pain  A1c up to 8%---has been sliding up Knows he is not eating right or exercising  Discussed trying off metformin for a week--to see if any difference in a week or so If better--stop and let me know (and I will change to jardiance if he can afford, or glipizide if he can't)

## 2022-12-07 ENCOUNTER — Telehealth: Payer: Self-pay | Admitting: Internal Medicine

## 2022-12-07 MED ORDER — METOPROLOL TARTRATE 25 MG PO TABS: 25.0000 mg | ORAL_TABLET | Freq: Two times a day (BID) | ORAL | 3 refills | Status: DC

## 2022-12-07 MED ORDER — AMLODIPINE BESYLATE 10 MG PO TABS: 10.0000 mg | ORAL_TABLET | Freq: Every day | ORAL | 3 refills | Status: DC

## 2022-12-07 NOTE — Telephone Encounter (Signed)
Caller Name: Tyjay Call back phone #: [phone]  MEDICATION(S):  metoprolol tartrate (LOPRESSOR) 25 MG tablet  amLODipine (NORVASC) 10 MG tablet   Days of Med Remaining: 0  Has the patient contacted their pharmacy (YES/NO)? NO What did pharmacy advise?   Preferred Pharmacy:  [facility] [address]   ~~~Please advise patient/caregiver to allow 2-3 business days to process RX refills.

## 2022-12-21 DIAGNOSIS — E119 Type 2 diabetes mellitus without complications: Secondary | ICD-10-CM | POA: Diagnosis not present

## 2022-12-21 DIAGNOSIS — Z961 Presence of intraocular lens: Secondary | ICD-10-CM | POA: Diagnosis not present

## 2022-12-21 DIAGNOSIS — H43813 Vitreous degeneration, bilateral: Secondary | ICD-10-CM | POA: Diagnosis not present

## 2022-12-21 DIAGNOSIS — H524 Presbyopia: Secondary | ICD-10-CM | POA: Diagnosis not present

## 2022-12-21 DIAGNOSIS — H52203 Unspecified astigmatism, bilateral: Secondary | ICD-10-CM | POA: Diagnosis not present

## 2022-12-21 LAB — HM DIABETES EYE EXAM

## 2023-01-30 ENCOUNTER — Other Ambulatory Visit: Payer: Self-pay | Admitting: Internal Medicine

## 2023-02-27 DIAGNOSIS — Z961 Presence of intraocular lens: Secondary | ICD-10-CM | POA: Diagnosis not present

## 2023-02-27 DIAGNOSIS — H40023 Open angle with borderline findings, high risk, bilateral: Secondary | ICD-10-CM | POA: Diagnosis not present

## 2023-05-25 ENCOUNTER — Ambulatory Visit (INDEPENDENT_AMBULATORY_CARE_PROVIDER_SITE_OTHER): Payer: Medicare Other | Admitting: Internal Medicine

## 2023-05-25 ENCOUNTER — Encounter: Payer: Self-pay | Admitting: Internal Medicine

## 2023-05-25 VITALS — BP 130/76 | HR 68 | Temp 97.9°F | Ht 69.0 in | Wt 195.0 lb

## 2023-05-25 DIAGNOSIS — Z0001 Encounter for general adult medical examination with abnormal findings: Secondary | ICD-10-CM

## 2023-05-25 DIAGNOSIS — E1159 Type 2 diabetes mellitus with other circulatory complications: Secondary | ICD-10-CM

## 2023-05-25 DIAGNOSIS — E785 Hyperlipidemia, unspecified: Secondary | ICD-10-CM | POA: Diagnosis not present

## 2023-05-25 DIAGNOSIS — M150 Primary generalized (osteo)arthritis: Secondary | ICD-10-CM

## 2023-05-25 DIAGNOSIS — K219 Gastro-esophageal reflux disease without esophagitis: Secondary | ICD-10-CM

## 2023-05-25 DIAGNOSIS — I25119 Atherosclerotic heart disease of native coronary artery with unspecified angina pectoris: Secondary | ICD-10-CM | POA: Diagnosis not present

## 2023-05-25 DIAGNOSIS — Z7984 Long term (current) use of oral hypoglycemic drugs: Secondary | ICD-10-CM | POA: Diagnosis not present

## 2023-05-25 DIAGNOSIS — N138 Other obstructive and reflux uropathy: Secondary | ICD-10-CM

## 2023-05-25 DIAGNOSIS — L570 Actinic keratosis: Secondary | ICD-10-CM | POA: Diagnosis not present

## 2023-05-25 DIAGNOSIS — M159 Polyosteoarthritis, unspecified: Secondary | ICD-10-CM | POA: Diagnosis not present

## 2023-05-25 DIAGNOSIS — Z0000 Encounter for general adult medical examination without abnormal findings: Secondary | ICD-10-CM

## 2023-05-25 DIAGNOSIS — N401 Enlarged prostate with lower urinary tract symptoms: Secondary | ICD-10-CM

## 2023-05-25 MED ORDER — TAMSULOSIN HCL 0.4 MG PO CAPS: 0.4000 mg | ORAL_CAPSULE | Freq: Every day | ORAL | 3 refills | Status: DC

## 2023-05-25 NOTE — Assessment & Plan Note (Signed)
Control seems about the same Action if over 8% now--would add jardiance 10 (or glipizide 5mg  if insurance an issue with jardiance) Continue the metformin 500 bid

## 2023-05-25 NOTE — Progress Notes (Signed)
Hearing Screening - Comments:: Passed whisper test Vision Screening - Comments:: January 2024  

## 2023-05-25 NOTE — Assessment & Plan Note (Signed)
Does okay with omeprazole 20mg  about 3 days per week

## 2023-05-25 NOTE — Assessment & Plan Note (Signed)
Increased symptoms Will try tamsulosin 0.4 mg daily

## 2023-05-25 NOTE — Progress Notes (Signed)
Subjective:    Patient ID: Peter Blair, male    DOB: Apr 01, 1941, 82 y.o.   MRN: [id number]  HPI Here for Medicare wellness visit and follow up of chronic health conditions Reviewed advanced directives Reviewed other doctors---Dr Gould--ophthal, Dr Hall--derm, Dr Bell--dentist No hospitalizations or surgery in the past year Rare beer No tobacco Not exercising Vision is fine Hearing is okay No falls No depression or anhedonia--just frustrated by physical limitations Independent with instrumental ADLs. 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 No syncope No edema  Uses the prilosec ~ 3 days per week Only occ heartburn No dysphagia recently  Current Outpatient Medications on File Prior to Visit  Medication Sig Dispense Refill   Accu-Chek FastClix Lancets MISC USE  1 TO CHECK GLUCOSE ONCE DAILY 102 each 0   amLODipine (NORVASC) 10 MG tablet Take 1 tablet (10 mg total) by mouth daily. 90 tablet 3   aspirin 81 MG tablet Take 81 mg by mouth daily.       Coenzyme Q10 200 MG TABS Take 1 tablet by mouth daily.   0   glucose blood (ACCU-CHEK GUIDE) test strip USE 1 STRIP ONCE DAILY 100 each 0   lisinopril (ZESTRIL) 40 MG tablet TAKE 1 TABLET BY MOUTH EVERY DAY 90 tablet 3   metFORMIN (GLUCOPHAGE) 500 MG tablet TAKE 1 TABLET BY MOUTH TWICE A DAY 180 tablet 3   metoprolol tartrate (LOPRESSOR) 25 MG tablet Take 1 tablet (25 mg total) by mouth 2 (two) times daily. 180 tablet 3    omeprazole (PRILOSEC) 20 MG capsule Take 20 mg by mouth daily as needed.     rosuvastatin (CRESTOR) 10 MG tablet TAKE 1 TABLET BY MOUTH EVERYDAY AT BEDTIME 90 tablet 3   sildenafil (REVATIO) 20 MG tablet TAKE 3 TO 5 TABLETS BY MOUTH DAILY AS DIRECTED 50 tablet 5   No current facility-administered medications on file prior to visit.    Allergies  Allergen Reactions   Iodinated Contrast Media     Other reaction(s): NAUSEA   Iohexol     Itchy eyes, dry mouth in early 1990s during imaging for renal calculi    Past Medical History:  Diagnosis Date   Coronary artery disease    exertional chest pain, cath 12/10 showed EF 50-55% with mild inferior hypokensis, 99% proximal and 60% distal RCA stenosis, 80% prox stenosis of a moderate sized D1, 40% prox LAD. Patient had BMS x2 placed in RCA. Unstable Angina. PROMUS DES to the prox RCA   Diabetes mellitus without complication (HCC)    Erectile dysfunction    GERD (gastroesophageal reflux disease)    Hyperlipidemia    Hypertension    Nephrolithiasis    Dr. Achilles Dunk   Renal cyst    Right shoulder pain    rotator cuff   Skin cancer    Skin cancer, PMH of, cell type unknown, Dr. Margo Aye    Past Surgical History:  Procedure Laterality Date   CATARACT EXTRACTION W/ INTRAOCULAR LENS IMPLANT  Bilateral    colonoscopy with ploypectomy  03/05/2012    Dr Jarold Motto, GI   CORONARY ANGIOPLASTY WITH STENT PLACEMENT     X 3 total stents   cystoscopic removal  04/04/2010   Flexibe sigmoidoscopy   12/06/1999   LITHOTRIPSY     x 1   RECONSTRUCTION OF EYELID Left     Family History  Problem Relation Age of Onset   Breast cancer Mother    Cervical cancer Mother    Diabetes Father    Heart attack Father 79   Breast cancer Sister    Colon cancer Sister        mets to liver & lung   Stroke Neg Hx     Social History   Socioeconomic History   Marital status: Married    Spouse name: Not on file   Number of children: 0   Years of education: Not on  file   Highest education level: Not on file  Occupational History   Occupation: Sports coach    Comment: Retired  Tobacco Use   Smoking status: Former    Types: Cigarettes    Quit date: 12/05/1970    Years since quitting: 52.5    Passive exposure: Never   Smokeless tobacco: Never   Tobacco comments:    smoked 1961-1972, up to 1 ppd  Substance and Sexual Activity   Alcohol use: Yes    Comment: minimally, 6 beers/year   Drug use: No   Sexual activity: Not on file  Other Topics Concern   Not on file  Social History Narrative   Has living will    Wife is health care POA-- no alternate set up (??niece or sister)   Would accept resuscitation attempts   No extended tube feedings if cognitively unaware   Social Determinants of Health   Financial Resource Strain: Not on file  Food Insecurity: Not on file  Transportation Needs: Not on file  Physical Activity: Not on file  Stress: Not on file  Social Connections: Not on file  Intimate Partner Violence: Not on file   Review of Systems Appetite is fine Sleep is okay--just bothered by pain at times Wears seat belt Teeth okay---keeps up with dentist Has some spots ot be checked--will go to derm Bowels move fine--no blood Urinary stream is slow---some urgency at times. Does feel he empties okay. Does have slight burning at times (long standing)     Objective:   Physical Exam Constitutional:      Appearance: Normal appearance.  HENT:     Mouth/Throat:     Pharynx: No oropharyngeal exudate or posterior oropharyngeal erythema.  Eyes:     Conjunctiva/sclera: Conjunctivae normal.     Pupils: Pupils are equal, round, and reactive to light.  Cardiovascular:     Rate and Rhythm: Normal rate and regular rhythm.     Pulses: Normal pulses.     Heart sounds: No murmur heard.    No gallop.  Pulmonary:     Effort: Pulmonary effort is normal.     Breath sounds: Normal breath sounds. No wheezing or rales.  Abdominal:      Palpations: Abdomen is soft.     Tenderness: There is no abdominal tenderness.  Musculoskeletal:     Cervical back: Neck supple.     Right lower leg: No edema.     Left lower leg: No edema.     Comments: Mild crepitus both knees Almost no rotation in right hip  Lymphadenopathy:  Cervical: No cervical adenopathy.  Skin:    Findings: No rash.     Comments: 5mm actinic on right cheek  Neurological:     General: No focal deficit present.     Mental Status: He is alert and oriented to person, place, and time.     Comments: Word naming 4/1 minute Recall 2/3  Psychiatric:        Mood and Affect: Mood normal.        Behavior: Behavior normal.            Assessment & Plan:

## 2023-05-25 NOTE — Assessment & Plan Note (Signed)
No problems with rosuvastatin--takes with CoQ 10

## 2023-05-25 NOTE — Patient Instructions (Signed)
I generally recommend Dr Magnus Ivan or Xi---at [facility] in [place].

## 2023-05-25 NOTE — Assessment & Plan Note (Signed)
He needs surgical evaluation He will set up with wife's surgeon---or let me know if referral needed

## 2023-05-25 NOTE — Assessment & Plan Note (Signed)
Hard to judge since almost no activity due to joint issues On ASA 81, metoprolol 25 bid, rosuvastatin 10 daily, lisinopril 40 daily

## 2023-05-25 NOTE — Assessment & Plan Note (Signed)
Discussed Rx options--he gave verbal consent Liquid nitrogen 25 seconds x 2 Tolerated well Discussed home care Will keep regular visits with Dr Margo Aye

## 2023-05-25 NOTE — Assessment & Plan Note (Signed)
I have personally reviewed the Medicare Annual Wellness questionnaire and have noted 1. The patient's medical and social history 2. Their use of alcohol, tobacco or illicit drugs 3. Their current medications and supplements 4. The patient's functional ability including ADL's, fall risks, home safety risks and hearing or visual             impairment. 5. Diet and physical activities 6. Evidence for depression or mood disorders  The patients weight, height, BMI and visual acuity have been recorded in the chart I have made referrals, counseling and provided education to the patient based review of the above and I have provided the pt with a written personalized care plan for preventive services.  I have provided you with a copy of your personalized plan for preventive services. Please take the time to review along with your updated medication list.  Done with cancer screening Needs to get back to exercise--may need to deal with joints first Flu/COVID/RSV vaccines this fall

## 2023-05-26 LAB — LIPID PANEL
Cholesterol: 120 mg/dL (ref 0–200)
HDL: 36.3 mg/dL — ABNORMAL LOW (ref >39.00)
LDL Cholesterol: 57 mg/dL (ref 0–99)
NonHDL: 83.47
Total CHOL/HDL Ratio: 3
Triglycerides: 134 mg/dL (ref 0.0–149.0)
VLDL: 26.8 mg/dL (ref 0.0–40.0)

## 2023-05-26 LAB — COMPREHENSIVE METABOLIC PANEL
ALT: 19 U/L (ref 0–53)
AST: 17 U/L (ref 0–37)
Albumin: 4.3 g/dL (ref 3.5–5.2)
Alkaline Phosphatase: 56 U/L (ref 39–117)
BUN: 19 mg/dL (ref 6–23)
CO2: 26 mEq/L (ref 19–32)
Calcium: 9.5 mg/dL (ref 8.4–10.5)
Chloride: 101 mEq/L (ref 96–112)
Creatinine, Ser: 1.17 mg/dL (ref 0.40–1.50)
GFR: 58.71 mL/min — ABNORMAL LOW (ref >60.00)
Glucose, Bld: 110 mg/dL — ABNORMAL HIGH (ref 70–99)
Potassium: 4.2 mEq/L (ref 3.5–5.1)
Sodium: 136 mEq/L (ref 135–145)
Total Bilirubin: 0.6 mg/dL (ref 0.2–1.2)
Total Protein: 7.5 g/dL (ref 6.0–8.3)

## 2023-05-26 LAB — HEMOGLOBIN A1C: Hgb A1c MFr Bld: 7.2 % — ABNORMAL HIGH (ref 4.6–6.5)

## 2023-05-26 LAB — CBC
HCT: 43.9 % (ref 39.0–52.0)
Hemoglobin: 14.8 g/dL (ref 13.0–17.0)
MCHC: 33.7 g/dL (ref 30.0–36.0)
MCV: 92.9 fl (ref 78.0–100.0)
Platelets: 178 10*3/uL (ref 150.0–400.0)
RBC: 4.73 Mil/uL (ref 4.22–5.81)
RDW: 13.4 % (ref 11.5–15.5)
WBC: 6.5 10*3/uL (ref 4.0–10.5)

## 2023-05-26 LAB — MICROALBUMIN / CREATININE URINE RATIO
Creatinine,U: 129.8 mg/dL
Microalb Creat Ratio: 2.6 mg/g (ref 0.0–30.0)
Microalb, Ur: 3.3 mg/dL — ABNORMAL HIGH (ref 0.0–1.9)

## 2023-09-25 ENCOUNTER — Other Ambulatory Visit (INDEPENDENT_AMBULATORY_CARE_PROVIDER_SITE_OTHER): Payer: Medicare Other

## 2023-09-25 ENCOUNTER — Ambulatory Visit: Payer: Medicare Other | Admitting: Orthopaedic Surgery

## 2023-09-25 VITALS — Ht 69.0 in | Wt 195.0 lb

## 2023-09-25 DIAGNOSIS — G8929 Other chronic pain: Secondary | ICD-10-CM

## 2023-09-25 DIAGNOSIS — M25551 Pain in right hip: Secondary | ICD-10-CM

## 2023-09-25 DIAGNOSIS — M25562 Pain in left knee: Secondary | ICD-10-CM | POA: Diagnosis not present

## 2023-09-25 DIAGNOSIS — M1611 Unilateral primary osteoarthritis, right hip: Secondary | ICD-10-CM

## 2023-09-25 DIAGNOSIS — M1712 Unilateral primary osteoarthritis, left knee: Secondary | ICD-10-CM | POA: Diagnosis not present

## 2023-09-25 NOTE — Progress Notes (Signed)
The patient is a very pleasant 82 year old gentleman sent from Dr. Alphonsus Sias to evaluate and treat left knee pain and right hip pain.  He has had several rounds of treatments of flexogenic's in his knees in the past.  It is gotten to the point where those do not help him at all.  His left knee hurts quite a bit but his right hip is gotten quite stiff and is hurting quite a bit.  He said the right hip is bothering him worse than the left knee.  He is a diabetic.  He says his A1c in December of last year was 8 but a few months ago was down to 7.3.  I was able to review all of his medications and past medical history within epic.  He does walk with a limp.  His right hip is significantly stiff with internal and external rotation.  The left hip moves much more smoothly.  The left knee has a varus malalignment with a lot of patellofemoral crepitation throughout the arc of motion of his knee.  He is got good flexion and extension but there is certainly crepitation as well as medial and lateral joint line tenderness.  2 views of the right knee shows severe end-stage arthritis with bone-on-bone wear, varus malalignment and osteophytes in all 3 compartments.  The medial compartment and the patellofemoral joint are significantly narrowed.  He did let me know he is been told he needed a knee replacement.  An AP pelvis and lateral of his right hip does show moderate arthritic findings.  There is slight flattening of the femoral head and neck with some signs of impingement.  This is the same on the left side but he seems to be asymptomatic on the left side.  At this point I would like to send him to Dr. Shon Baton for an ultrasound-guided intra-articular steroid injection in his right hip since that is bothering him the most.  We can then see him in follow-up from there to see how he is responded to that right hip injection and can talk more about knee replacements and hip replacements.  His wife has had both of her hips  replaced.  He agrees with this treatment plan.

## 2023-09-28 ENCOUNTER — Telehealth: Payer: Self-pay | Admitting: Internal Medicine

## 2023-09-28 MED ORDER — ACCU-CHEK GUIDE VI STRP: ORAL_STRIP | 3 refills | Status: AC

## 2023-09-28 MED ORDER — ACCU-CHEK FASTCLIX LANCETS MISC: 1.0000 | Freq: Every day | 3 refills | Status: AC

## 2023-09-28 NOTE — Telephone Encounter (Signed)
Prescription Request  09/28/2023  LOV: 05/25/2023  What is the name of the medication or equipment? Accu-Chek FastClix Lancets MISC  glucose blood (ACCU-CHEK GUIDE) test strip    Have you contacted your pharmacy to request a refill? Yes   Which pharmacy would you like this sent to?  [id number] [place], [place] - [address] [place] [address] Phone: [phone] Fax: [phone]    Patient notified that their request is being sent to the clinical staff for review and that they should receive a response within 2 business days.   Please advise at [facility] [phone]

## 2023-09-28 NOTE — Telephone Encounter (Signed)
Rx sent electronically.  

## 2023-09-29 ENCOUNTER — Ambulatory Visit: Payer: Medicare Other | Admitting: Sports Medicine

## 2023-09-29 ENCOUNTER — Other Ambulatory Visit: Payer: Self-pay

## 2023-09-29 ENCOUNTER — Encounter: Payer: Self-pay | Admitting: Sports Medicine

## 2023-09-29 DIAGNOSIS — M25551 Pain in right hip: Secondary | ICD-10-CM

## 2023-09-29 DIAGNOSIS — M1611 Unilateral primary osteoarthritis, right hip: Secondary | ICD-10-CM

## 2023-09-29 MED ORDER — METHYLPREDNISOLONE ACETATE 40 MG/ML IJ SUSP
40.0000 mg | INTRAMUSCULAR | Status: AC | PRN
  Administered 2023-09-29: 40 mg via INTRA_ARTICULAR

## 2023-09-29 MED ORDER — LIDOCAINE HCL 1 % IJ SOLN
4.0000 mL | INTRAMUSCULAR | Status: AC | PRN
  Administered 2023-09-29: 4 mL

## 2023-09-29 NOTE — Progress Notes (Signed)
Procedure Note  Patient: Peter Blair             Date of Birth: [birth year]           MRN: [id number]             Visit Date: 09/29/2023  Procedures: Visit Diagnoses:  1. Pain of right hip   2. Unilateral primary osteoarthritis, right hip    Large Joint Inj: R hip joint on 09/29/2023 3:57 PM Indications: pain Details: 22 G 3.5 in needle, ultrasound-guided anterior approach Medications: 4 mL lidocaine 1 %; 40 mg methylPREDNISolone acetate 40 MG/ML Outcome: tolerated well, no immediate complications  Procedure: US-guided intra-articular hip injection, Right After discussion on risks/benefits/indications and informed verbal consent was obtained, a timeout was performed. Patient was lying supine on exam table. The hip was cleaned with betadine and alcohol swabs. Then utilizing ultrasound guidance, the patient's femoral head and neck junction was identified and subsequently injected with 4:1 lidocaine:depomedrol via an in-plane approach with ultrasound visualization of the injectate administered into the hip joint. Patient tolerated procedure well without immediate complications.  Procedure, treatment alternatives, risks and benefits explained, specific risks discussed. Consent was given by the patient. Immediately prior to procedure a time out was called to verify the correct patient, procedure, equipment, support staff and site/side marked as required. Patient was prepped and draped in the usual sterile fashion.     - I evaluated the patient about 5 minutes post-injection and he had improvement in pain and range of motion, just some "soreness" - follow-up with Dr. Magnus Ivan as indicated; I am happy to see them as needed  Madelyn Brunner, DO Primary Care Sports Medicine Physician  [facility]  This note was dictated using Dragon naturally speaking software and may contain errors in syntax, spelling, or content which have not been identified prior to signing this  note.

## 2023-10-16 ENCOUNTER — Ambulatory Visit: Payer: Medicare Other | Admitting: Orthopaedic Surgery

## 2023-10-16 ENCOUNTER — Encounter: Payer: Self-pay | Admitting: Orthopaedic Surgery

## 2023-10-16 DIAGNOSIS — G8929 Other chronic pain: Secondary | ICD-10-CM | POA: Diagnosis not present

## 2023-10-16 DIAGNOSIS — M1712 Unilateral primary osteoarthritis, left knee: Secondary | ICD-10-CM | POA: Diagnosis not present

## 2023-10-16 DIAGNOSIS — M25562 Pain in left knee: Secondary | ICD-10-CM

## 2023-10-16 NOTE — Progress Notes (Signed)
The patient is a 82 year old gentleman who comes in to talk about his left knee and right hip again.  He has known severe arthritis of his left knee but only moderate arthritis of the right hip hip.  It has been well-documented that he does need a knee replacement on his left knee but we sent him to Dr. Shon Baton for an intra-articular steroid injection in his right hip due to the severity of his right hip pain that day.  He said the injections worked about 20% he is really better with better range of motion and less pain.  Most of the pain on his right hip is on the lateral aspect of his hip today.  On exam his left knee has significant varus malalignment and a mild effusion.  There is pain throughout the arc of motion of that knee with bone-on-bone wear medial as well as lateral and patellofemoral.  It is end-stage arthritis.  His left hip moves smoothly and fluidly.  His right hip today actually move smoothly and fluidly and I do feel that most of his pain is mainly related to offloading his left knee and this is affecting his right hip.  We did have a long and thorough discussion about knee replacement surgery at this visit today.  I showed him a knee replacement model and described in detail what the surgery involves.  We discussed the risk and benefits of surgery and what to expect from an intraoperative and postoperative standpoint.  He does see Dr. Alphonsus Sias again in December to have his hemoglobin A1c checked.  It had been around 8 but then had gone down to 7.2.  He is working on blood glucose control.  It may elevate some due to having a steroid injection but hopefully will stay down well enough so we can go ahead and proceed with surgery.  In the interim we will go ahead and put him on the OR schedule as soon as we can which is likely after the first of the year which she understands that as well.  All question concerns were answered and addressed.  We will be in touch with scheduling him for a left knee  replacement.

## 2023-10-23 ENCOUNTER — Other Ambulatory Visit: Payer: Self-pay | Admitting: Internal Medicine

## 2023-11-21 ENCOUNTER — Ambulatory Visit: Payer: Medicare Other | Admitting: Sports Medicine

## 2023-11-23 ENCOUNTER — Encounter: Payer: Self-pay | Admitting: Internal Medicine

## 2023-11-23 ENCOUNTER — Ambulatory Visit: Payer: Medicare Other | Admitting: Internal Medicine

## 2023-11-23 VITALS — BP 124/76 | HR 75 | Temp 98.1°F | Ht 69.0 in | Wt 197.0 lb

## 2023-11-23 DIAGNOSIS — Z7984 Long term (current) use of oral hypoglycemic drugs: Secondary | ICD-10-CM

## 2023-11-23 DIAGNOSIS — M150 Primary generalized (osteo)arthritis: Secondary | ICD-10-CM | POA: Diagnosis not present

## 2023-11-23 DIAGNOSIS — I2510 Atherosclerotic heart disease of native coronary artery without angina pectoris: Secondary | ICD-10-CM | POA: Diagnosis not present

## 2023-11-23 DIAGNOSIS — E1159 Type 2 diabetes mellitus with other circulatory complications: Secondary | ICD-10-CM

## 2023-11-23 LAB — POCT GLYCOSYLATED HEMOGLOBIN (HGB A1C): Hemoglobin A1C: 7.3 % — AB (ref 4.0–5.6)
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
## 2023-11-23 NOTE — Assessment & Plan Note (Signed)
The plan was to replace his left knee---but seems to be more limited due to the right hip Will contact Dr Magnus Ivan about this

## 2023-11-23 NOTE — Assessment & Plan Note (Signed)
Lab Results  Component Value Date   HGBA1C 7.3 (A) 11/23/2023   Still with good control on the metformin 500 bid Is on statin

## 2023-11-23 NOTE — Assessment & Plan Note (Signed)
Limited activity due to arthritic pain No apparent angina Appears to be medically stable for joint replacement surgery On ASA, lisinopril 40, rosuvastatin 10 and metoprolol 25 bid Would just hold the asa for a week pre-op

## 2023-11-24 ENCOUNTER — Ambulatory Visit: Payer: Medicare Other | Admitting: Sports Medicine

## 2023-12-04 ENCOUNTER — Ambulatory Visit: Payer: Medicare Other | Admitting: Orthopaedic Surgery

## 2023-12-04 ENCOUNTER — Other Ambulatory Visit (INDEPENDENT_AMBULATORY_CARE_PROVIDER_SITE_OTHER): Payer: Medicare Other

## 2023-12-04 DIAGNOSIS — M25562 Pain in left knee: Secondary | ICD-10-CM

## 2023-12-04 DIAGNOSIS — G8929 Other chronic pain: Secondary | ICD-10-CM

## 2023-12-04 DIAGNOSIS — M1712 Unilateral primary osteoarthritis, left knee: Secondary | ICD-10-CM

## 2023-12-04 DIAGNOSIS — M25551 Pain in right hip: Secondary | ICD-10-CM

## 2023-12-04 DIAGNOSIS — M79604 Pain in right leg: Secondary | ICD-10-CM | POA: Diagnosis not present

## 2023-12-04 DIAGNOSIS — M1611 Unilateral primary osteoarthritis, right hip: Secondary | ICD-10-CM | POA: Diagnosis not present

## 2023-12-04 MED ORDER — GABAPENTIN 600 MG PO TABS: 600.0000 mg | ORAL_TABLET | Freq: Every day | ORAL | 0 refills | Status: DC

## 2023-12-04 MED ORDER — METHOCARBAMOL 500 MG PO TABS: 500.0000 mg | ORAL_TABLET | Freq: Four times a day (QID) | ORAL | 1 refills | Status: DC | PRN

## 2023-12-04 NOTE — Progress Notes (Signed)
The patient has known severe end-stage arthritis of his left knee and we actually have not scheduled for knee replacement surgery on Tuesday, December 15, 2023.  He has also been dealing with severe right hip pain.  He has known osteoarthritis of the right hip.  My partner Dr. Shon Baton was able to provide an intra-articular steroid injection in that right hip joint back at the end of October.  He comes in today having worsening right hip pain.  He said that the intra-articular injection really did not help much.  He feels like he may need a hip replacement.  However he does describe pains going down his leg and into his foot as well.  On examination his right hip I can easily put his right hip through internal and external rotation and there is not any blocks to rotation and there is not severe pain in the groin.  However he does have a positive straight leg raise on the right side.  He did have a fall back in September.  He is 82 years old and active and he may have injured something when he fell.  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  We had a long and thorough discussion about his left knee as well as his right hip and his back.  I really do not feel that the right hip is his issue.  I would like to try methocarbamol as well as some gabapentin 300 mg to take at bedtime and see if this will help with his symptoms in the short-term.  He should still keep his preoperative appointment for his left knee replacement for the next week and we can continue to follow him over this next week.  His left knee does have bone-on-bone arthritis and is in need of a knee replacement at some point.  He feels like he should go ahead and pursue that surgery next week as scheduled.

## 2023-12-05 NOTE — Progress Notes (Signed)
Please place orders for PAT appointment scheduled 12/07/23.

## 2023-12-05 NOTE — Progress Notes (Addendum)
 COVID Vaccine Completed: yes  Date of COVID positive in last 61 days:no  PCP - Charlie Denise, MD Cardiologist - Ezra Shuck, MD LOV 2017  Chest x-ray - n/a EKG - 12/06/22 Epic/chart Stress Test - 2013 ECHO - n/a Cardiac Cath - 11/09/09, stents x3 Pacemaker/ICD device last checked: n/a Spinal Cord Stimulator: n/a  Bowel Prep - no  Sleep Study - n/a CPAP -   Fasting Blood Sugar - 130-150 Checks Blood Sugar  once or twice a week  Last dose of GLP1 agonist-  N/A GLP1 instructions:  Hold 7 days before surgery    Last dose of SGLT-2 inhibitors-  N/A SGLT-2 instructions:  Hold 3 days before surgery    Blood Thinner Instructions:  Time Aspirin  Instructions: ASA 81, hold 7 days Last Dose:  Activity level: Can go up a flight of stairs and perform activities of daily living without stopping and without symptoms of chest pain or shortness of breath. Difficulty with stairs due to hip pain  Anesthesia review: HTN , CAD has 3 stents, DM2, has not followed up with cardiologist since 2017  Patient denies shortness of breath, fever, cough and chest pain at PAT appointment  Patient verbalized understanding of instructions that were given to them at the PAT appointment. Patient was also instructed that they will need to review over the PAT instructions again at home before surgery.

## 2023-12-05 NOTE — Patient Instructions (Addendum)
 SURGICAL WAITING ROOM VISITATION  Patients having surgery or a procedure may have no more than 2 support people in the waiting area - these visitors may rotate.    Children under the age of 22 must have an adult with them who is not the patient.  Due to an increase in RSV and influenza rates and associated hospitalizations, children ages 69 and under may not visit patients in [facility] hospitals.  If the patient needs to stay at the hospital during part of their recovery, the visitor guidelines for inpatient rooms apply. Pre-op nurse will coordinate an appropriate time for 1 support person to accompany patient in pre-op.  This support person may not rotate.    Please refer to the [facility] website for the visitor guidelines for Inpatients (after your surgery is over and you are in a regular room).    Your procedure is scheduled on: 12/15/23   Report to [facility] Main Entrance    Report to admitting at 8:45 AM   Call this number if you have problems the morning of surgery [phone]   Do not eat food :After Midnight.   After Midnight you may have the following liquids until 8:15 AM DAY OF SURGERY  Water  Non-Citrus Juices (without pulp, NO RED-Apple, White grape, White cranberry) Black Coffee (NO MILK/CREAM OR CREAMERS, sugar ok)  Clear Tea (NO MILK/CREAM OR CREAMERS, sugar ok) regular and decaf                             Plain Jell-O (NO RED)                                           Fruit ices (not with fruit pulp, NO RED)                                     Popsicles (NO RED)                                                               Sports drinks like Gatorade (NO RED)           If you have questions, please contact your surgeon's office.   FOLLOW BOWEL PREP AND ANY ADDITIONAL PRE OP INSTRUCTIONS YOU RECEIVED FROM YOUR SURGEON'S OFFICE!!!     Oral Hygiene is also important to reduce your risk of infection.                                    Remember -  BRUSH YOUR TEETH THE MORNING OF SURGERY WITH YOUR REGULAR TOOTHPASTE  DENTURES WILL BE REMOVED PRIOR TO SURGERY PLEASE DO NOT APPLY Poly grip OR ADHESIVES!!!   Stop all vitamins and herbal supplements 7 days before surgery.   Take these medicines the morning of surgery with A SIP OF WATER : Amlodipine , Metoprolol , Omeprazole   DO NOT TAKE ANY ORAL DIABETIC MEDICATIONS DAY OF YOUR SURGERY  How to Manage Your Diabetes Before and After Surgery  Why is it important to control my blood sugar before and after surgery? Improving blood sugar levels before and after surgery helps healing and can limit problems. A way of improving blood sugar control is eating a healthy diet by:  Eating less sugar and carbohydrates  Increasing activity/exercise  Talking with your doctor about reaching your blood sugar goals High blood sugars (greater than 180 mg/dL) can raise your risk of infections and slow your recovery, so you will need to focus on controlling your diabetes during the weeks before surgery. Make sure that the doctor who takes care of your diabetes knows about your planned surgery including the date and location.  How do I manage my blood sugar before surgery? Check your blood sugar at least 4 times a day, starting 2 days before surgery, to make sure that the level is not too high or low. Check your blood sugar the morning of your surgery when you wake up and every 2 hours until you get to the Short Stay unit. If your blood sugar is less than 70 mg/dL, you will need to treat for low blood sugar: Do not take insulin . Treat a low blood sugar (less than 70 mg/dL) with  cup of clear juice (cranberry or apple), 4 glucose tablets, OR glucose gel. Recheck blood sugar in 15 minutes after treatment (to make sure it is greater than 70 mg/dL). If your blood sugar is not greater than 70 mg/dL on recheck, call [phone] for further instructions. Report your blood sugar to the short stay nurse when you  get to Short Stay.  If you are admitted to the hospital after surgery: Your blood sugar will be checked by the staff and you will probably be given insulin  after surgery (instead of oral diabetes medicines) to make sure you have good blood sugar levels. The goal for blood sugar control after surgery is 80-180 mg/dL.   WHAT DO I DO ABOUT MY DIABETES MEDICATION?  Do not take oral diabetes medicines (pills) the morning of surgery.  THE DAY BEFORE SURGERY, take Metformin  as prescribed.     THE MORNING OF SURGERY, do not take Metformin .  Reviewed and Endorsed by [facility] Patient Education Committee, August 2015                              You may not have any metal on your body including jewelry, and body piercing             Do not wear lotions, powders, cologne, or deodorant              Men may shave face and neck.   Do not bring valuables to the hospital. [facility] IS NOT             RESPONSIBLE   FOR VALUABLES.   Contacts, glasses, dentures or bridgework may not be worn into surgery.   Bring small overnight bag day of surgery.   DO NOT BRING YOUR HOME MEDICATIONS TO THE HOSPITAL. PHARMACY WILL DISPENSE MEDICATIONS LISTED ON YOUR MEDICATION LIST TO YOU DURING YOUR ADMISSION IN THE HOSPITAL!    Special Instructions: Bring a copy of your healthcare power of attorney and living will documents the day of surgery if you haven't scanned them before.              Please read over the following fact sheets you were given: IF YOU HAVE QUESTIONS ABOUT YOUR PRE-OP INSTRUCTIONS  PLEASE CALL [phone] [place]   If you received a COVID test during your pre-op visit  it is requested that you wear a mask when out in public, stay away from anyone that may not be feeling well and notify your surgeon if you develop symptoms. If you test positive for Covid or have been in contact with anyone that has tested positive in the last 10 days please notify you surgeon.      Pre-operative 5 CHG  Bath Instructions   You can play a key role in reducing the risk of infection after surgery. Your skin needs to be as free of germs as possible. You can reduce the number of germs on your skin by washing with CHG (chlorhexidine  gluconate) soap before surgery. CHG is an antiseptic soap that kills germs and continues to kill germs even after washing.   DO NOT use if you have an allergy to chlorhexidine /CHG or antibacterial soaps. If your skin becomes reddened or irritated, stop using the CHG and notify one of our RNs at [phone].   Please shower with the CHG soap starting 4 days before surgery using the following schedule:     Please keep in mind the following:  DO NOT shave, including legs and underarms, starting the day of your first shower.   You may shave your face at any point before/day of surgery.  Place clean sheets on your bed the day you start using CHG soap. Use a clean washcloth (not used since being washed) for each shower. DO NOT sleep with pets once you start using the CHG.   CHG Shower Instructions:  If you choose to wash your hair and private area, wash first with your normal shampoo/soap.  After you use shampoo/soap, rinse your hair and body thoroughly to remove shampoo/soap residue.  Turn the water  OFF and apply about 3 tablespoons (45 ml) of CHG soap to a CLEAN washcloth.  Apply CHG soap ONLY FROM YOUR NECK DOWN TO YOUR TOES (washing for 3-5 minutes)  DO NOT use CHG soap on face, private areas, open wounds, or sores.  Pay special attention to the area where your surgery is being performed.  If you are having back surgery, having someone wash your back for you may be helpful. Wait 2 minutes after CHG soap is applied, then you may rinse off the CHG soap.  Pat dry with a clean towel  Put on clean clothes/pajamas   If you choose to wear lotion, please use ONLY the CHG-compatible lotions on the back of this paper.     Additional instructions for the day of surgery: DO  NOT APPLY any lotions, deodorants, cologne, or perfumes.   Put on clean/comfortable clothes.  Brush your teeth.  Ask your nurse before applying any prescription medications to the skin.      CHG Compatible Lotions   Aveeno Moisturizing lotion  Cetaphil Moisturizing Cream  Cetaphil Moisturizing Lotion  Clairol Herbal Essence Moisturizing Lotion, Dry Skin  Clairol Herbal Essence Moisturizing Lotion, Extra Dry Skin  Clairol Herbal Essence Moisturizing Lotion, Normal Skin  Curel Age Defying Therapeutic Moisturizing Lotion with Alpha Hydroxy  Curel Extreme Care Body Lotion  Curel Soothing Hands Moisturizing Hand Lotion  Curel Therapeutic Moisturizing Cream, Fragrance-Free  Curel Therapeutic Moisturizing Lotion, Fragrance-Free  Curel Therapeutic Moisturizing Lotion, Original Formula  Eucerin Daily Replenishing Lotion  Eucerin Dry Skin Therapy Plus Alpha Hydroxy Crme  Eucerin Dry Skin Therapy Plus Alpha Hydroxy Lotion  Eucerin Original Crme  Eucerin Original Lotion  Eucerin  Plus Crme Eucerin Plus Lotion  Eucerin TriLipid Replenishing Lotion  Keri Anti-Bacterial Hand Lotion  Keri Deep Conditioning Original Lotion Dry Skin Formula Softly Scented  Keri Deep Conditioning Original Lotion, Fragrance Free Sensitive Skin Formula  Keri Lotion Fast Absorbing Fragrance Free Sensitive Skin Formula  Keri Lotion Fast Absorbing Softly Scented Dry Skin Formula  Keri Original Lotion  Keri Skin Renewal Lotion Keri Silky Smooth Lotion  Keri Silky Smooth Sensitive Skin Lotion  Nivea Body Creamy Conditioning Oil  Nivea Body Extra Enriched Teacher, Adult Education Moisturizing Lotion Nivea Crme  Nivea Skin Firming Lotion  NutraDerm 30 Skin Lotion  NutraDerm Skin Lotion  NutraDerm Therapeutic Skin Cream  NutraDerm Therapeutic Skin Lotion  ProShield Protective Hand Cream  Provon moisturizing lotion

## 2023-12-07 ENCOUNTER — Encounter (HOSPITAL_COMMUNITY): Payer: Self-pay

## 2023-12-07 ENCOUNTER — Other Ambulatory Visit: Payer: Self-pay

## 2023-12-07 ENCOUNTER — Encounter (HOSPITAL_COMMUNITY)
Admission: RE | Admit: 2023-12-07 | Discharge: 2023-12-07 | Disposition: A | Discharge status: Home / Self Care | Payer: Medicare Other | Source: Ambulatory Visit | Attending: Orthopaedic Surgery | Admitting: Orthopaedic Surgery

## 2023-12-07 VITALS — BP 150/84 | HR 71 | Temp 98.1°F | Resp 16 | Ht 69.5 in | Wt 197.0 lb

## 2023-12-07 DIAGNOSIS — Z01818 Encounter for other preprocedural examination: Secondary | ICD-10-CM | POA: Diagnosis not present

## 2023-12-07 DIAGNOSIS — Z87891 Personal history of nicotine dependence: Secondary | ICD-10-CM | POA: Diagnosis not present

## 2023-12-07 DIAGNOSIS — I4510 Unspecified right bundle-branch block: Secondary | ICD-10-CM | POA: Insufficient documentation

## 2023-12-07 DIAGNOSIS — I10 Essential (primary) hypertension: Secondary | ICD-10-CM | POA: Insufficient documentation

## 2023-12-07 DIAGNOSIS — M1712 Unilateral primary osteoarthritis, left knee: Secondary | ICD-10-CM | POA: Diagnosis not present

## 2023-12-07 DIAGNOSIS — Z955 Presence of coronary angioplasty implant and graft: Secondary | ICD-10-CM | POA: Diagnosis not present

## 2023-12-07 DIAGNOSIS — I2510 Atherosclerotic heart disease of native coronary artery without angina pectoris: Secondary | ICD-10-CM | POA: Diagnosis not present

## 2023-12-07 DIAGNOSIS — Z7984 Long term (current) use of oral hypoglycemic drugs: Secondary | ICD-10-CM | POA: Diagnosis not present

## 2023-12-07 DIAGNOSIS — E1159 Type 2 diabetes mellitus with other circulatory complications: Secondary | ICD-10-CM | POA: Diagnosis not present

## 2023-12-07 LAB — CBC
HCT: 42.9 % (ref 39.0–52.0)
Hemoglobin: 14.7 g/dL (ref 13.0–17.0)
MCH: 31.9 pg (ref 26.0–34.0)
MCHC: 34.3 g/dL (ref 30.0–36.0)
MCV: 93.1 fL (ref 80.0–100.0)
Platelets: 161 10*3/uL (ref 150–400)
RBC: 4.61 MIL/uL (ref 4.22–5.81)
RDW: 13 % (ref 11.5–15.5)
WBC: 6.6 10*3/uL (ref 4.0–10.5)
nRBC: 0 % (ref 0.0–0.2)

## 2023-12-07 LAB — BASIC METABOLIC PANEL
Anion gap: 6 (ref 5–15)
BUN: 18 mg/dL (ref 8–23)
CO2: 23 mmol/L (ref 22–32)
Calcium: 9.2 mg/dL (ref 8.9–10.3)
Chloride: 103 mmol/L (ref 98–111)
Creatinine, Ser: 0.93 mg/dL (ref 0.61–1.24)
GFR, Estimated: >60 mL/min (ref >60)
Glucose, Bld: 138 mg/dL — ABNORMAL HIGH (ref 70–99)
Potassium: 4.3 mmol/L (ref 3.5–5.1)
Sodium: 132 mmol/L — ABNORMAL LOW (ref 135–145)

## 2023-12-07 LAB — GLUCOSE, CAPILLARY: Glucose-Capillary: 138 mg/dL — ABNORMAL HIGH (ref 70–99)

## 2023-12-08 LAB — SURGICAL PCR SCREEN
MRSA, PCR: NEGATIVE
Staphylococcus aureus: POSITIVE — AB

## 2023-12-08 NOTE — Progress Notes (Signed)
ST[facility] + results routed to Dr. Blackman 

## 2023-12-11 ENCOUNTER — Ambulatory Visit: Payer: Medicare Other | Admitting: Orthopaedic Surgery

## 2023-12-11 NOTE — Progress Notes (Signed)
 Anesthesia Chart Review   Case: [id number] Date/Time: 12/15/23 1100   Procedure: LEFT TOTAL KNEE ARTHROPLASTY (Left: Knee)   Anesthesia type: Spinal   Pre-op diagnosis: Osteoarthritis Left Knee   Location: [facility] ROOM 10 / [facility]   Surgeons: Vernetta Lonni GRADE, MD       DISCUSSION:83 y.o. former smoker with h/o HTN, CAD, DM II, left knee OA scheduled for above procedure 12/15/2023 with Dr. Lonni Vernetta.   Pt s/p CAD with in stent restenosis, DES to proximal RCA placed 2011.  Has followed with PCP since 2018 for this.  Per PCP notes 11/23/2023 activity limited by arthritic pain, no angina, stable for joint replacement.  VS: BP (!) 150/84   Pulse 71   Temp 36.7 C (Oral)   Resp 16   Ht 5' 9.5 (1.765 m)   Wt 89.4 kg   SpO2 98%   BMI 28.67 kg/m   PROVIDERS: Jimmy Charlie FERNS, MD is PCP   Cardiologist - Ezra Shuck, MD  LABS: Labs reviewed: Acceptable for surgery. (all labs ordered are listed, but only abnormal results are displayed)  Labs Reviewed  SURGICAL PCR SCREEN - Abnormal; Notable for the following components:      Result Value   Staphylococcus aureus POSITIVE (*)    All other components within normal limits  BASIC METABOLIC PANEL - Abnormal; Notable for the following components:   Sodium 132 (*)    Glucose, Bld 138 (*)    All other components within normal limits  GLUCOSE, CAPILLARY - Abnormal; Notable for the following components:   Glucose-Capillary 138 (*)    All other components within normal limits  CBC     IMAGES:   EKG:   CV:  Past Medical History:  Diagnosis Date   Coronary artery disease    exertional chest pain, cath 12/10 showed EF 50-55% with mild inferior hypokensis, 99% proximal and 60% distal RCA stenosis, 80% prox stenosis of a moderate sized D1, 40% prox LAD. Patient had BMS x2 placed in RCA. Unstable Angina. PROMUS DES to the prox RCA   Diabetes mellitus without complication (HCC)    Erectile dysfunction    GERD  (gastroesophageal reflux disease)    Hyperlipidemia    Hypertension    Nephrolithiasis    Dr. Ike   Renal cyst    Right shoulder pain    rotator cuff   Skin cancer    Skin cancer, PMH of, cell type unknown, Dr. Shona    Past Surgical History:  Procedure Laterality Date   CATARACT EXTRACTION W/ INTRAOCULAR LENS IMPLANT Bilateral    colonoscopy with ploypectomy  03/05/2012    Dr Jakie, GI   CORONARY ANGIOPLASTY WITH STENT PLACEMENT     X 3 total stents   cystoscopic removal  04/04/2010   Flexibe sigmoidoscopy   12/06/1999   LITHOTRIPSY     x 1   RECONSTRUCTION OF EYELID Left     MEDICATIONS:  Accu-Chek FastClix Lancets MISC   amLODipine  (NORVASC ) 10 MG tablet   aspirin  81 MG tablet   Coenzyme Q10 200 MG TABS   gabapentin  (NEURONTIN ) 600 MG tablet   glucose blood (ACCU-CHEK GUIDE) test strip   lisinopril  (ZESTRIL ) 40 MG tablet   metFORMIN  (GLUCOPHAGE ) 500 MG tablet   methocarbamol  (ROBAXIN ) 500 MG tablet   metoprolol  tartrate (LOPRESSOR ) 25 MG tablet   omeprazole (PRILOSEC) 20 MG capsule   rosuvastatin  (CRESTOR ) 10 MG tablet   sildenafil  (REVATIO ) 20 MG tablet   tamsulosin  (FLOMAX ) 0.4 MG CAPS  capsule   No current facility-administered medications for this encounter.   Harlene Hoots Ward, PA-C [facility]-Surgical [facility] [phone]

## 2023-12-12 ENCOUNTER — Other Ambulatory Visit: Payer: Self-pay | Admitting: *Deleted

## 2023-12-12 DIAGNOSIS — M1712 Unilateral primary osteoarthritis, left knee: Secondary | ICD-10-CM

## 2023-12-14 ENCOUNTER — Other Ambulatory Visit: Payer: Self-pay | Admitting: Internal Medicine

## 2023-12-14 NOTE — H&P (Signed)
 TOTAL KNEE ADMISSION H&P  Patient is being admitted for left total knee arthroplasty.  Subjective:  Chief Complaint:left knee pain.  HPI: Peter Blair, 83 y.o. male, has a history of pain and functional disability in the left knee due to arthritis and has failed non-surgical conservative treatments for greater than 12 weeks to includeNSAID's and/or analgesics, corticosteriod injections, viscosupplementation injections, flexibility and strengthening excercises, use of assistive devices, and activity modification.  Onset of symptoms was gradual, starting 2 years ago with gradually worsening course since that time. The patient noted no past surgery on the left knee(s).  Patient currently rates pain in the left knee(s) at 10 out of 10 with activity. Patient has night pain, worsening of pain with activity and weight bearing, pain that interferes with activities of daily living, pain with passive range of motion, crepitus, and joint swelling.  Patient has evidence of subchondral sclerosis, periarticular osteophytes, and joint space narrowing by imaging studies. There is no active infection.  Patient Active Problem List   Diagnosis Date Noted   Unilateral primary osteoarthritis, left knee 09/25/2023   Unilateral primary osteoarthritis, right hip 09/25/2023   Actinic keratosis 10/17/2018   Type 2 diabetes mellitus with other circulatory complications (HCC) 12/11/2017   Osteoarthritis, multiple sites 10/11/2017   Preventative health care 08/05/2015   Advance directive discussed with patient 08/05/2015   GERD (gastroesophageal reflux disease)    Hyperplastic colonic polyp 12/17/2013   Benign prostatic hyperplasia with urinary obstruction 01/16/2013   ED (erectile dysfunction) of organic origin 01/16/2013   Coronary atherosclerosis of native coronary artery 11/20/2009   NEPHROLITHIASIS 09/29/2009   RENAL CYST, RIGHT 09/29/2009   Hyperlipemia 09/16/2008   SKIN CANCER, HX OF 09/16/2008   Essential  hypertension, benign 07/09/2007   Past Medical History:  Diagnosis Date   Coronary artery disease    exertional chest pain, cath 12/10 showed EF 50-55% with mild inferior hypokensis, 99% proximal and 60% distal RCA stenosis, 80% prox stenosis of a moderate sized D1, 40% prox LAD. Patient had BMS x2 placed in RCA. Unstable Angina. PROMUS DES to the prox RCA   Diabetes mellitus without complication (HCC)    Erectile dysfunction    GERD (gastroesophageal reflux disease)    Hyperlipidemia    Hypertension    Nephrolithiasis    Dr. Ike   Renal cyst    Right shoulder pain    rotator cuff   Skin cancer    Skin cancer, PMH of, cell type unknown, Dr. Shona    Past Surgical History:  Procedure Laterality Date   CATARACT EXTRACTION W/ INTRAOCULAR LENS IMPLANT Bilateral    colonoscopy with ploypectomy  03/05/2012    Dr Jakie, GI   CORONARY ANGIOPLASTY WITH STENT PLACEMENT     X 3 total stents   cystoscopic removal  04/04/2010   Flexibe sigmoidoscopy   12/06/1999   LITHOTRIPSY     x 1   RECONSTRUCTION OF EYELID Left     No current facility-administered medications for this encounter.   Current Outpatient Medications  Medication Sig Dispense Refill Last Dose/Taking   aspirin  81 MG tablet Take 81 mg by mouth daily.     Taking   Coenzyme Q10 200 MG TABS Take 1 tablet by mouth daily.   0 Taking   gabapentin  (NEURONTIN ) 600 MG tablet Take 1 tablet (600 mg total) by mouth at bedtime. Take one half tablet at bedtime 20 tablet 0 Taking   lisinopril  (ZESTRIL ) 40 MG tablet TAKE 1 TABLET BY MOUTH EVERY  DAY 90 tablet 3 Taking   metFORMIN  (GLUCOPHAGE ) 500 MG tablet TAKE 1 TABLET BY MOUTH TWICE A DAY 180 tablet 3 Taking   methocarbamol  (ROBAXIN ) 500 MG tablet Take 1 tablet (500 mg total) by mouth every 6 (six) hours as needed. 40 tablet 1 Taking As Needed   metoprolol  tartrate (LOPRESSOR ) 25 MG tablet TAKE 1 TABLET BY MOUTH TWICE A DAY 180 tablet 3 Taking   omeprazole (PRILOSEC) 20 MG capsule Take  20 mg by mouth daily as needed.   Taking As Needed   sildenafil  (REVATIO ) 20 MG tablet TAKE 3 TO 5 TABLETS BY MOUTH DAILY AS DIRECTED (Patient taking differently: Take 3-5 tablets by mouth as needed (ED).) 50 tablet 5 Taking Differently   Accu-Chek FastClix Lancets MISC 1 each by In Vitro route daily. 102 each 3    amLODipine  (NORVASC ) 10 MG tablet TAKE 1 TABLET BY MOUTH EVERY DAY 90 tablet 3    glucose blood (ACCU-CHEK GUIDE) test strip USE 1 STRIP ONCE DAILY 100 each 3    rosuvastatin  (CRESTOR ) 10 MG tablet TAKE 1 TABLET BY MOUTH EVERYDAY AT BEDTIME 90 tablet 3    tamsulosin  (FLOMAX ) 0.4 MG CAPS capsule Take 1 capsule (0.4 mg total) by mouth daily. (Patient not taking: Reported on 11/23/2023) 90 capsule 3 Not Taking   Allergies  Allergen Reactions   Iodinated Contrast Media     Other reaction(s): NAUSEA   Iohexol      Itchy eyes, dry mouth in early 1990s during imaging for renal calculi    Social History   Tobacco Use   Smoking status: Former    Current packs/day: 0.00    Types: Cigarettes    Quit date: 12/05/1970    Years since quitting: 53.0    Passive exposure: Never   Smokeless tobacco: Never   Tobacco comments:    smoked 1961-1972, up to 1 ppd  Substance Use Topics   Alcohol use: Yes    Comment: minimally, 6 beers/year    Family History  Problem Relation Age of Onset   Breast cancer Mother    Cervical cancer Mother    Diabetes Father    Heart attack Father 40   Breast cancer Sister    Colon cancer Sister        mets to liver & lung   Stroke Neg Hx      Review of Systems  Objective:  Physical Exam Vitals reviewed.  Constitutional:      Appearance: Normal appearance. He is normal weight.  HENT:     Head: Normocephalic and atraumatic.  Eyes:     Extraocular Movements: Extraocular movements intact.     Pupils: Pupils are equal, round, and reactive to light.  Cardiovascular:     Rate and Rhythm: Normal rate.     Pulses: Normal pulses.  Pulmonary:     Effort:  Pulmonary effort is normal.     Breath sounds: Normal breath sounds.  Abdominal:     Palpations: Abdomen is soft.  Musculoskeletal:     Cervical back: Normal range of motion and neck supple.     Left knee: Effusion, bony tenderness and crepitus present. Decreased range of motion. Tenderness present over the medial joint line and lateral joint line. Abnormal alignment and abnormal meniscus.  Neurological:     Mental Status: He is alert and oriented to person, place, and time.  Psychiatric:        Behavior: Behavior normal.     Vital signs in last 24 hours:  Labs:   Estimated body mass index is 28.67 kg/m as calculated from the following:   Height as of 12/07/23: 5' 9.5 (1.765 m).   Weight as of 12/07/23: 89.4 kg.   Imaging Review Plain radiographs demonstrate severe degenerative joint disease of the left knee(s). The overall alignment issignificant varus. The bone quality appears to be good for age and reported activity level.      Assessment/Plan:  End stage arthritis, left knee   The patient history, physical examination, clinical judgment of the provider and imaging studies are consistent with end stage degenerative joint disease of the left knee(s) and total knee arthroplasty is deemed medically necessary. The treatment options including medical management, injection therapy arthroscopy and arthroplasty were discussed at length. The risks and benefits of total knee arthroplasty were presented and reviewed. The risks due to aseptic loosening, infection, stiffness, patella tracking problems, thromboembolic complications and other imponderables were discussed. The patient acknowledged the explanation, agreed to proceed with the plan and consent was signed. Patient is being admitted for inpatient treatment for surgery, pain control, PT, OT, prophylactic antibiotics, VTE prophylaxis, progressive ambulation and ADL's and discharge planning. The patient is planning to be discharged  home with home health services

## 2023-12-15 ENCOUNTER — Other Ambulatory Visit: Payer: Self-pay

## 2023-12-15 ENCOUNTER — Encounter (HOSPITAL_COMMUNITY): Payer: Self-pay | Admitting: Orthopaedic Surgery

## 2023-12-15 ENCOUNTER — Encounter (HOSPITAL_COMMUNITY)
Admission: RE | Disposition: A | Discharge status: Home Health | Payer: Self-pay | Source: Home / Self Care | Attending: Orthopaedic Surgery

## 2023-12-15 ENCOUNTER — Ambulatory Visit (HOSPITAL_COMMUNITY): Payer: Medicare Other | Admitting: Anesthesiology

## 2023-12-15 ENCOUNTER — Inpatient Hospital Stay (HOSPITAL_COMMUNITY)
Admission: RE | Admit: 2023-12-15 | Discharge: 2023-12-18 | DRG: 470 | Disposition: A | Discharge status: Home Health | Payer: Medicare Other | Attending: Orthopaedic Surgery | Admitting: Orthopaedic Surgery

## 2023-12-15 ENCOUNTER — Ambulatory Visit (HOSPITAL_COMMUNITY): Payer: Medicare Other | Admitting: Physician Assistant

## 2023-12-15 ENCOUNTER — Observation Stay (HOSPITAL_COMMUNITY): Payer: Medicare Other

## 2023-12-15 DIAGNOSIS — Z85828 Personal history of other malignant neoplasm of skin: Secondary | ICD-10-CM

## 2023-12-15 DIAGNOSIS — N35919 Unspecified urethral stricture, male, unspecified site: Secondary | ICD-10-CM | POA: Diagnosis present

## 2023-12-15 DIAGNOSIS — Z955 Presence of coronary angioplasty implant and graft: Secondary | ICD-10-CM | POA: Diagnosis not present

## 2023-12-15 DIAGNOSIS — Z79899 Other long term (current) drug therapy: Secondary | ICD-10-CM | POA: Diagnosis not present

## 2023-12-15 DIAGNOSIS — M1712 Unilateral primary osteoarthritis, left knee: Principal | ICD-10-CM | POA: Diagnosis present

## 2023-12-15 DIAGNOSIS — Z833 Family history of diabetes mellitus: Secondary | ICD-10-CM

## 2023-12-15 DIAGNOSIS — Z8249 Family history of ischemic heart disease and other diseases of the circulatory system: Secondary | ICD-10-CM | POA: Diagnosis not present

## 2023-12-15 DIAGNOSIS — Z9842 Cataract extraction status, left eye: Secondary | ICD-10-CM

## 2023-12-15 DIAGNOSIS — Z91041 Radiographic dye allergy status: Secondary | ICD-10-CM

## 2023-12-15 DIAGNOSIS — Z800 Family history of malignant neoplasm of digestive organs: Secondary | ICD-10-CM

## 2023-12-15 DIAGNOSIS — Z860102 Personal history of hyperplastic colon polyps: Secondary | ICD-10-CM

## 2023-12-15 DIAGNOSIS — E785 Hyperlipidemia, unspecified: Secondary | ICD-10-CM | POA: Diagnosis not present

## 2023-12-15 DIAGNOSIS — Z803 Family history of malignant neoplasm of breast: Secondary | ICD-10-CM | POA: Diagnosis not present

## 2023-12-15 DIAGNOSIS — N365 Urethral false passage: Secondary | ICD-10-CM | POA: Diagnosis not present

## 2023-12-15 DIAGNOSIS — I10 Essential (primary) hypertension: Secondary | ICD-10-CM | POA: Diagnosis not present

## 2023-12-15 DIAGNOSIS — Z87442 Personal history of urinary calculi: Secondary | ICD-10-CM | POA: Diagnosis not present

## 2023-12-15 DIAGNOSIS — Z87891 Personal history of nicotine dependence: Secondary | ICD-10-CM | POA: Diagnosis not present

## 2023-12-15 DIAGNOSIS — E119 Type 2 diabetes mellitus without complications: Secondary | ICD-10-CM | POA: Diagnosis not present

## 2023-12-15 DIAGNOSIS — E1159 Type 2 diabetes mellitus with other circulatory complications: Secondary | ICD-10-CM

## 2023-12-15 DIAGNOSIS — Z7984 Long term (current) use of oral hypoglycemic drugs: Secondary | ICD-10-CM

## 2023-12-15 DIAGNOSIS — G8918 Other acute postprocedural pain: Secondary | ICD-10-CM | POA: Diagnosis not present

## 2023-12-15 DIAGNOSIS — Z96652 Presence of left artificial knee joint: Secondary | ICD-10-CM | POA: Diagnosis not present

## 2023-12-15 DIAGNOSIS — N35914 Unspecified anterior urethral stricture, male: Secondary | ICD-10-CM | POA: Diagnosis not present

## 2023-12-15 DIAGNOSIS — I2510 Atherosclerotic heart disease of native coronary artery without angina pectoris: Secondary | ICD-10-CM | POA: Diagnosis present

## 2023-12-15 DIAGNOSIS — Z471 Aftercare following joint replacement surgery: Secondary | ICD-10-CM | POA: Diagnosis not present

## 2023-12-15 DIAGNOSIS — Z8049 Family history of malignant neoplasm of other genital organs: Secondary | ICD-10-CM

## 2023-12-15 DIAGNOSIS — Z961 Presence of intraocular lens: Secondary | ICD-10-CM | POA: Diagnosis present

## 2023-12-15 DIAGNOSIS — Z7982 Long term (current) use of aspirin: Secondary | ICD-10-CM | POA: Diagnosis not present

## 2023-12-15 DIAGNOSIS — K219 Gastro-esophageal reflux disease without esophagitis: Secondary | ICD-10-CM | POA: Diagnosis present

## 2023-12-15 DIAGNOSIS — Z9841 Cataract extraction status, right eye: Secondary | ICD-10-CM | POA: Diagnosis not present

## 2023-12-15 DIAGNOSIS — N401 Enlarged prostate with lower urinary tract symptoms: Secondary | ICD-10-CM | POA: Diagnosis present

## 2023-12-15 HISTORY — PX: TOTAL KNEE ARTHROPLASTY: SHX125

## 2023-12-15 HISTORY — PX: CYSTOSCOPY: SHX5120

## 2023-12-15 LAB — GLUCOSE, CAPILLARY
Glucose-Capillary: 151 mg/dL — ABNORMAL HIGH (ref 70–99)
Glucose-Capillary: 179 mg/dL — ABNORMAL HIGH (ref 70–99)
Glucose-Capillary: 214 mg/dL — ABNORMAL HIGH (ref 70–99)
Glucose-Capillary: 294 mg/dL — ABNORMAL HIGH (ref 70–99)

## 2023-12-15 SURGERY — ARTHROPLASTY, KNEE, TOTAL
Anesthesia: Spinal | Site: Urethra

## 2023-12-15 MED ORDER — FENTANYL CITRATE PF 50 MCG/ML IJ SOSY
PREFILLED_SYRINGE | INTRAMUSCULAR | Status: AC
  Administered 2023-12-15: 50 ug via INTRAVENOUS
  Filled 2023-12-15: qty 3

## 2023-12-15 MED ORDER — PROPOFOL 1000 MG/100ML IV EMUL
INTRAVENOUS | Status: AC
  Filled 2023-12-15: qty 100

## 2023-12-15 MED ORDER — POVIDONE-IODINE 10 % EX SWAB: 2.0000 | Freq: Once | CUTANEOUS | Status: DC

## 2023-12-15 MED ORDER — METHOCARBAMOL 500 MG PO TABS
500.0000 mg | ORAL_TABLET | Freq: Four times a day (QID) | ORAL | Status: DC | PRN
  Administered 2023-12-16 – 2023-12-18 (×2): 500 mg via ORAL
  Filled 2023-12-15 (×2): qty 1

## 2023-12-15 MED ORDER — INSULIN ASPART 100 UNIT/ML IJ SOLN: 0.0000 [IU] | INTRAMUSCULAR | Status: DC | PRN

## 2023-12-15 MED ORDER — ONDANSETRON HCL 4 MG/2ML IJ SOLN
INTRAMUSCULAR | Status: DC | PRN
  Administered 2023-12-15: 4 mg via INTRAVENOUS

## 2023-12-15 MED ORDER — LACTATED RINGERS IV SOLN
INTRAVENOUS | Status: DC
Start: 2023-12-15 — End: 2023-12-15

## 2023-12-15 MED ORDER — ROPIVACAINE HCL 5 MG/ML IJ SOLN
INTRAMUSCULAR | Status: DC | PRN
  Administered 2023-12-15: 20 mL via PERINEURAL

## 2023-12-15 MED ORDER — METOCLOPRAMIDE HCL 5 MG/ML IJ SOLN
5.0000 mg | Freq: Three times a day (TID) | INTRAMUSCULAR | Status: DC | PRN
  Administered 2023-12-17: 10 mg via INTRAVENOUS
  Filled 2023-12-15: qty 2

## 2023-12-15 MED ORDER — ORAL CARE MOUTH RINSE: 15.0000 mL | Freq: Once | OROMUCOSAL | Status: AC

## 2023-12-15 MED ORDER — ACETAMINOPHEN 325 MG PO TABS
325.0000 mg | ORAL_TABLET | Freq: Four times a day (QID) | ORAL | Status: DC | PRN
  Filled 2023-12-15: qty 2

## 2023-12-15 MED ORDER — ACETAMINOPHEN 500 MG PO TABS
1000.0000 mg | ORAL_TABLET | Freq: Once | ORAL | Status: AC
  Administered 2023-12-15: 1000 mg via ORAL
  Filled 2023-12-15: qty 2

## 2023-12-15 MED ORDER — BUPIVACAINE IN DEXTROSE 0.75-8.25 % IT SOLN
INTRATHECAL | Status: DC | PRN
  Administered 2023-12-15: 1.6 mL via INTRATHECAL

## 2023-12-15 MED ORDER — KETOROLAC TROMETHAMINE 15 MG/ML IJ SOLN
7.5000 mg | Freq: Four times a day (QID) | INTRAMUSCULAR | Status: DC
  Administered 2023-12-15 – 2023-12-16 (×3): 7.5 mg via INTRAVENOUS
  Filled 2023-12-15 (×4): qty 1

## 2023-12-15 MED ORDER — DOCUSATE SODIUM 100 MG PO CAPS
100.0000 mg | ORAL_CAPSULE | Freq: Two times a day (BID) | ORAL | Status: DC
  Administered 2023-12-15 – 2023-12-18 (×5): 100 mg via ORAL
  Filled 2023-12-15 (×5): qty 1

## 2023-12-15 MED ORDER — SODIUM CHLORIDE 0.9 % IV SOLN: INTRAVENOUS | Status: AC

## 2023-12-15 MED ORDER — FENTANYL CITRATE PF 50 MCG/ML IJ SOSY
50.0000 ug | PREFILLED_SYRINGE | INTRAMUSCULAR | Status: AC
Start: 2023-12-15 — End: 2023-12-15
  Administered 2023-12-15: 50 ug via INTRAVENOUS
  Filled 2023-12-15: qty 2

## 2023-12-15 MED ORDER — TRANEXAMIC ACID-NACL 1000-0.7 MG/100ML-% IV SOLN
1000.0000 mg | INTRAVENOUS | Status: AC
  Administered 2023-12-15: 1000 mg via INTRAVENOUS
  Filled 2023-12-15: qty 100

## 2023-12-15 MED ORDER — MUPIROCIN 2 % EX OINT: TOPICAL_OINTMENT | Freq: Two times a day (BID) | CUTANEOUS | Status: DC

## 2023-12-15 MED ORDER — METOPROLOL TARTRATE 25 MG PO TABS
25.0000 mg | ORAL_TABLET | Freq: Two times a day (BID) | ORAL | Status: DC
  Administered 2023-12-15 – 2023-12-18 (×6): 25 mg via ORAL
  Filled 2023-12-15 (×6): qty 1

## 2023-12-15 MED ORDER — FENTANYL CITRATE PF 50 MCG/ML IJ SOSY
25.0000 ug | PREFILLED_SYRINGE | INTRAMUSCULAR | Status: DC | PRN
Start: 2023-12-15 — End: 2023-12-15
  Administered 2023-12-15: 50 ug via INTRAVENOUS

## 2023-12-15 MED ORDER — LIDOCAINE HCL (CARDIAC) PF 100 MG/5ML IV SOSY
PREFILLED_SYRINGE | INTRAVENOUS | Status: DC | PRN
  Administered 2023-12-15: 100 mg via INTRATRACHEAL

## 2023-12-15 MED ORDER — DEXAMETHASONE SODIUM PHOSPHATE 10 MG/ML IJ SOLN
INTRAMUSCULAR | Status: AC
  Filled 2023-12-15: qty 1

## 2023-12-15 MED ORDER — ALUM & MAG HYDROXIDE-SIMETH 200-200-20 MG/5ML PO SUSP
30.0000 mL | ORAL | Status: DC | PRN
Start: 2023-12-15 — End: 2023-12-18

## 2023-12-15 MED ORDER — METHOCARBAMOL 500 MG PO TABS
ORAL_TABLET | ORAL | Status: AC
  Administered 2023-12-15: 500 mg via ORAL
  Filled 2023-12-15: qty 1

## 2023-12-15 MED ORDER — TAMSULOSIN HCL 0.4 MG PO CAPS
0.4000 mg | ORAL_CAPSULE | Freq: Every day | ORAL | Status: DC
  Filled 2023-12-15 (×3): qty 1

## 2023-12-15 MED ORDER — BUPIVACAINE-EPINEPHRINE 0.25% -1:200000 IJ SOLN
INTRAMUSCULAR | Status: DC | PRN
  Administered 2023-12-15: 50 mL

## 2023-12-15 MED ORDER — SODIUM CHLORIDE 0.9 % IR SOLN
Status: DC | PRN
  Administered 2023-12-15: 1000 mL

## 2023-12-15 MED ORDER — CEFAZOLIN SODIUM-DEXTROSE 2-4 GM/100ML-% IV SOLN
2.0000 g | Freq: Four times a day (QID) | INTRAVENOUS | Status: AC
  Administered 2023-12-15 (×2): 2 g via INTRAVENOUS
  Filled 2023-12-15 (×2): qty 100

## 2023-12-15 MED ORDER — MENTHOL 3 MG MT LOZG: 1.0000 | LOZENGE | OROMUCOSAL | Status: DC | PRN

## 2023-12-15 MED ORDER — CHLORHEXIDINE GLUCONATE 0.12 % MT SOLN
15.0000 mL | Freq: Once | OROMUCOSAL | Status: AC
  Administered 2023-12-15: 15 mL via OROMUCOSAL

## 2023-12-15 MED ORDER — OXYCODONE HCL 5 MG PO TABS
ORAL_TABLET | ORAL | Status: AC
  Administered 2023-12-15: 5 mg via ORAL
  Filled 2023-12-15: qty 1

## 2023-12-15 MED ORDER — CHLORHEXIDINE GLUCONATE 4 % EX SOLN: 1.0000 | CUTANEOUS | 1 refills | Status: DC

## 2023-12-15 MED ORDER — MUPIROCIN 2 % EX OINT
1.0000 | TOPICAL_OINTMENT | Freq: Two times a day (BID) | CUTANEOUS | Status: DC
  Administered 2023-12-15: 1 via TOPICAL
  Filled 2023-12-15: qty 22

## 2023-12-15 MED ORDER — PROPOFOL 500 MG/50ML IV EMUL
INTRAVENOUS | Status: DC | PRN
  Administered 2023-12-15: 50 ug/kg/min via INTRAVENOUS

## 2023-12-15 MED ORDER — ONDANSETRON HCL 4 MG/2ML IJ SOLN
4.0000 mg | Freq: Four times a day (QID) | INTRAMUSCULAR | Status: DC | PRN
  Administered 2023-12-17: 4 mg via INTRAVENOUS
  Filled 2023-12-15: qty 2

## 2023-12-15 MED ORDER — ONDANSETRON HCL 4 MG/2ML IJ SOLN
INTRAMUSCULAR | Status: AC
  Filled 2023-12-15: qty 2

## 2023-12-15 MED ORDER — 0.9 % SODIUM CHLORIDE (POUR BTL) OPTIME
TOPICAL | Status: DC | PRN
  Administered 2023-12-15: 1000 mL

## 2023-12-15 MED ORDER — PHENOL 1.4 % MT LIQD: 1.0000 | OROMUCOSAL | Status: DC | PRN

## 2023-12-15 MED ORDER — PROPOFOL 10 MG/ML IV BOLUS
INTRAVENOUS | Status: AC
  Filled 2023-12-15: qty 20

## 2023-12-15 MED ORDER — PHENYLEPHRINE HCL-NACL 20-0.9 MG/250ML-% IV SOLN
INTRAVENOUS | Status: DC | PRN
Start: 2023-12-15 — End: 2023-12-15
  Administered 2023-12-15: 30 ug/min via INTRAVENOUS

## 2023-12-15 MED ORDER — HYDROMORPHONE HCL 1 MG/ML IJ SOLN
0.5000 mg | INTRAMUSCULAR | Status: DC | PRN
Start: 2023-12-15 — End: 2023-12-18
  Administered 2023-12-15 – 2023-12-17 (×4): 1 mg via INTRAVENOUS
  Filled 2023-12-15 (×3): qty 1

## 2023-12-15 MED ORDER — ONDANSETRON HCL 4 MG PO TABS: 4.0000 mg | ORAL_TABLET | Freq: Four times a day (QID) | ORAL | Status: DC | PRN

## 2023-12-15 MED ORDER — ONDANSETRON HCL 4 MG/2ML IJ SOLN: 4.0000 mg | Freq: Once | INTRAMUSCULAR | Status: DC | PRN

## 2023-12-15 MED ORDER — METOCLOPRAMIDE HCL 5 MG PO TABS: 5.0000 mg | ORAL_TABLET | Freq: Three times a day (TID) | ORAL | Status: DC | PRN

## 2023-12-15 MED ORDER — ASPIRIN 81 MG PO CHEW
81.0000 mg | CHEWABLE_TABLET | Freq: Two times a day (BID) | ORAL | Status: DC
  Administered 2023-12-15 – 2023-12-18 (×6): 81 mg via ORAL
  Filled 2023-12-15 (×6): qty 1

## 2023-12-15 MED ORDER — OXYCODONE HCL 5 MG PO TABS
10.0000 mg | ORAL_TABLET | ORAL | Status: DC | PRN
Start: 2023-12-15 — End: 2023-12-18
  Administered 2023-12-17: 15 mg via ORAL
  Administered 2023-12-17: 10 mg via ORAL
  Administered 2023-12-17 – 2023-12-18 (×4): 15 mg via ORAL
  Filled 2023-12-15 (×5): qty 3
  Filled 2023-12-15: qty 2

## 2023-12-15 MED ORDER — PROPOFOL 10 MG/ML IV BOLUS
INTRAVENOUS | Status: DC | PRN
  Administered 2023-12-15 (×2): 20 mg via INTRAVENOUS
  Administered 2023-12-15 (×2): 10 mg via INTRAVENOUS

## 2023-12-15 MED ORDER — PANTOPRAZOLE SODIUM 40 MG PO TBEC
40.0000 mg | DELAYED_RELEASE_TABLET | Freq: Every day | ORAL | Status: DC
  Administered 2023-12-17: 40 mg via ORAL
  Filled 2023-12-15 (×3): qty 1

## 2023-12-15 MED ORDER — DIPHENHYDRAMINE HCL 12.5 MG/5ML PO ELIX
12.5000 mg | ORAL_SOLUTION | ORAL | Status: DC | PRN
  Administered 2023-12-18: 25 mg via ORAL
  Filled 2023-12-15: qty 10

## 2023-12-15 MED ORDER — AMLODIPINE BESYLATE 10 MG PO TABS
10.0000 mg | ORAL_TABLET | Freq: Every day | ORAL | Status: DC
  Administered 2023-12-16 – 2023-12-18 (×3): 10 mg via ORAL
  Filled 2023-12-15 (×3): qty 1

## 2023-12-15 MED ORDER — DEXAMETHASONE SODIUM PHOSPHATE 10 MG/ML IJ SOLN
INTRAMUSCULAR | Status: DC | PRN
  Administered 2023-12-15: 8 mg via INTRAVENOUS

## 2023-12-15 MED ORDER — MUPIROCIN 2 % EX OINT: 1.0000 | TOPICAL_OINTMENT | Freq: Two times a day (BID) | CUTANEOUS | 0 refills | Status: AC

## 2023-12-15 MED ORDER — OXYCODONE HCL 5 MG PO TABS
5.0000 mg | ORAL_TABLET | ORAL | Status: DC | PRN
Start: 2023-12-15 — End: 2023-12-18
  Administered 2023-12-16 (×2): 5 mg via ORAL
  Filled 2023-12-15 (×2): qty 1

## 2023-12-15 MED ORDER — CEFAZOLIN SODIUM-DEXTROSE 2-4 GM/100ML-% IV SOLN
2.0000 g | INTRAVENOUS | Status: AC
  Administered 2023-12-15: 2 g via INTRAVENOUS
  Filled 2023-12-15: qty 100

## 2023-12-15 MED ORDER — BUPIVACAINE-EPINEPHRINE 0.25% -1:200000 IJ SOLN
INTRAMUSCULAR | Status: AC
  Filled 2023-12-15: qty 1

## 2023-12-15 MED ORDER — GABAPENTIN 300 MG PO CAPS
300.0000 mg | ORAL_CAPSULE | Freq: Every day | ORAL | Status: DC
  Administered 2023-12-15 – 2023-12-17 (×3): 300 mg via ORAL
  Filled 2023-12-15 (×3): qty 1

## 2023-12-15 MED ORDER — MUPIROCIN 2 % EX OINT: 1.0000 | TOPICAL_OINTMENT | Freq: Once | CUTANEOUS | Status: DC

## 2023-12-15 MED ORDER — DEXAMETHASONE SODIUM PHOSPHATE 10 MG/ML IJ SOLN
INTRAMUSCULAR | Status: DC | PRN
  Administered 2023-12-15: 10 mg

## 2023-12-15 MED ORDER — METFORMIN HCL 500 MG PO TABS
500.0000 mg | ORAL_TABLET | Freq: Two times a day (BID) | ORAL | Status: DC
  Administered 2023-12-15 – 2023-12-18 (×6): 500 mg via ORAL
  Filled 2023-12-15 (×6): qty 1

## 2023-12-15 SURGICAL SUPPLY — 52 items
BAG COUNTER SPONGE SURGICOUNT (BAG) IMPLANT
BAG ZIPLOCK 12X15 (MISCELLANEOUS) ×2 IMPLANT
BENZOIN TINCTURE PRP APPL 2/3 (GAUZE/BANDAGES/DRESSINGS) IMPLANT
BLADE SAG 18X100X1.27 (BLADE) ×2 IMPLANT
BLADE SURG SZ10 CARB STEEL (BLADE) ×4 IMPLANT
BNDG ELASTIC 6INX 5YD STR LF (GAUZE/BANDAGES/DRESSINGS) ×4 IMPLANT
BNDG ELASTIC 6X10 VLCR STRL LF (GAUZE/BANDAGES/DRESSINGS) IMPLANT
BOWL SMART MIX CTS (DISPOSABLE) IMPLANT
CEMENT BONE R 1X40 (Cement) IMPLANT
COMP FEM CMT PERS SZ 11 LT (Joint) ×2 IMPLANT
COMP TIB KNEE PS G 0D LT (Joint) ×2 IMPLANT
COMPONENT FEM CMT PERS SZ11LT (Joint) IMPLANT
COMPONENT TIB KNEE PS G 0D LT (Joint) IMPLANT
COOLER ICEMAN CLASSIC (MISCELLANEOUS) ×2 IMPLANT
COVER SURGICAL LIGHT HANDLE (MISCELLANEOUS) ×2 IMPLANT
CUFF TRNQT CYL 34X4.125X (TOURNIQUET CUFF) ×2 IMPLANT
DRAPE INCISE IOBAN 66X45 STRL (DRAPES) ×2 IMPLANT
DRAPE U-SHAPE 47X51 STRL (DRAPES) ×2 IMPLANT
DURAPREP 26ML APPLICATOR (WOUND CARE) ×2 IMPLANT
ELECT BLADE TIP CTD 4 INCH (ELECTRODE) ×2 IMPLANT
ELECT REM PT RETURN 15FT ADLT (MISCELLANEOUS) ×2 IMPLANT
GAUZE PAD ABD 8X10 STRL (GAUZE/BANDAGES/DRESSINGS) ×4 IMPLANT
GAUZE SPONGE 4X4 12PLY STRL (GAUZE/BANDAGES/DRESSINGS) ×2 IMPLANT
GAUZE XEROFORM 1X8 LF (GAUZE/BANDAGES/DRESSINGS) IMPLANT
GLOVE BIO SURGEON STRL SZ7.5 (GLOVE) ×2 IMPLANT
GLOVE BIOGEL PI IND STRL 8 (GLOVE) ×4 IMPLANT
GLOVE ECLIPSE 8.0 STRL XLNG CF (GLOVE) ×2 IMPLANT
GOWN STRL REUS W/ TWL XL LVL3 (GOWN DISPOSABLE) ×4 IMPLANT
HOLDER FOLEY CATH W/STRAP (MISCELLANEOUS) IMPLANT
IMMOBILIZER KNEE 20 (SOFTGOODS) ×2
IMMOBILIZER KNEE 20 THIGH 36 (SOFTGOODS) ×2 IMPLANT
INSERT FIXED AS PERS SZ8-11 LT (Insert) IMPLANT
KIT TURNOVER KIT A (KITS) IMPLANT
NS IRRIG 1000ML POUR BTL (IV SOLUTION) ×2 IMPLANT
PACK TOTAL KNEE CUSTOM (KITS) ×2 IMPLANT
PAD COLD SHLDR WRAP-ON (PAD) ×2 IMPLANT
PADDING CAST ABS COTTON 6X4 NS (CAST SUPPLIES) IMPLANT
PADDING CAST COTTON 6X4 STRL (CAST SUPPLIES) ×4 IMPLANT
PROTECTOR NERVE ULNAR (MISCELLANEOUS) ×2 IMPLANT
SCREW FEMALE HEX FIX 25X2.5 (ORTHOPEDIC DISPOSABLE SUPPLIES) IMPLANT
SET HNDPC FAN SPRY TIP SCT (DISPOSABLE) ×2 IMPLANT
SET PAD KNEE POSITIONER (MISCELLANEOUS) ×2 IMPLANT
SPIKE FLUID TRANSFER (MISCELLANEOUS) IMPLANT
STAPLER SKIN PROX WIDE 3.9 (STAPLE) IMPLANT
STEM POLY PAT PLY 35M KNEE (Knees) IMPLANT
STRIP CLOSURE SKIN 1/2X4 (GAUZE/BANDAGES/DRESSINGS) IMPLANT
SUT MNCRL AB 4-0 PS2 18 (SUTURE) IMPLANT
SUT VIC AB 0 CT1 27XBRD ANTBC (SUTURE) ×2 IMPLANT
SUT VIC AB 1 CT1 36 (SUTURE) ×4 IMPLANT
SUT VIC AB 2-0 CT1 TAPERPNT 27 (SUTURE) ×4 IMPLANT
TRAY FOLEY MTR SLVR 16FR STAT (SET/KITS/TRAYS/PACK) IMPLANT
WATER STERILE IRR 1000ML POUR (IV SOLUTION) ×4 IMPLANT

## 2023-12-15 NOTE — Anesthesia Procedure Notes (Signed)
 Anesthesia Regional Block: Adductor canal block   Pre-Anesthetic Checklist: , timeout performed,  Correct Patient, Correct Site, Correct Laterality,  Correct Procedure, Correct Position, site marked,  Risks and benefits discussed,  Surgical consent,  Pre-op evaluation,  At surgeon's request and post-op pain management  Laterality: Left  Prep: chloraprep       Needles:  Injection technique: Single-shot  Needle Type: Echogenic Needle     Needle Length: 9cm  Needle Gauge: 21     Additional Needles:   Procedures:,,,, ultrasound used (permanent image in chart),,    Narrative:  Start time: 12/15/2023 10:13 AM End time: 12/15/2023 10:20 AM Injection made incrementally with aspirations every 5 mL.  Performed by: Personally  Anesthesiologist: Corinne Garnette BRAVO, MD  Additional Notes: No pain on injection. No increased resistance to injection. Injection made in 5cc increments.  Good needle visualization.  Patient tolerated procedure well.

## 2023-12-15 NOTE — Transfer of Care (Signed)
 Immediate Anesthesia Transfer of Care Note  Patient: Peter Blair  Procedure(s) Performed: LEFT TOTAL KNEE ARTHROPLASTY (Left: Knee) CYSTOSCOPY FLEXIBLE; URETHERAL DILATION; INSERTION OF FOLEY CATHETER (Urethra)  Patient Location: PACU  Anesthesia Type:Spinal  Level of Consciousness: awake and alert   Airway & Oxygen Therapy: Patient Spontanous Breathing and Patient connected to face mask oxygen  Post-op Assessment: Report given to RN and Post -op Vital signs reviewed and stable  Post vital signs: Reviewed and stable  Last Vitals:  Vitals Value Taken Time  BP 138/75 12/15/23 1330  Temp    Pulse 66 12/15/23 1331  Resp 22 12/15/23 1331  SpO2 99 % 12/15/23 1331  Vitals shown include unfiled device data.  Last Pain:  Vitals:   12/15/23 1020  TempSrc:   PainSc: 0-No pain      Patients Stated Pain Goal: 3 (12/15/23 0933)  Complications: No notable events documented.

## 2023-12-15 NOTE — Anesthesia Preprocedure Evaluation (Signed)
 Anesthesia Evaluation  Patient identified by MRN, date of birth, ID band Patient awake    Reviewed: Allergy & Precautions, NPO status , Patient's Chart, lab work & pertinent test results, reviewed documented beta blocker date and time   Airway Mallampati: III  TM Distance: >3 FB Neck ROM: Full    Dental  (+) Teeth Intact, Dental Advisory Given   Pulmonary former smoker   Pulmonary exam normal breath sounds clear to auscultation       Cardiovascular hypertension, Pt. on medications and Pt. on home beta blockers + CAD and + Cardiac Stents  Normal cardiovascular exam Rhythm:Regular Rate:Normal     Neuro/Psych negative neurological ROS     GI/Hepatic Neg liver ROS,GERD  ,,  Endo/Other  diabetes, Type 2, Oral Hypoglycemic Agents    Renal/GU negative Renal ROS     Musculoskeletal  (+) Arthritis ,    Abdominal   Peds  Hematology negative hematology ROS (+)   Anesthesia Other Findings Day of surgery medications reviewed with the patient.  Reproductive/Obstetrics                             Anesthesia Physical Anesthesia Plan  ASA: 3  Anesthesia Plan: Spinal   Post-op Pain Management: Regional block* and Tylenol  PO (pre-op)*   Induction: Intravenous  PONV Risk Score and Plan: 1 and TIVA, Dexamethasone  and Ondansetron   Airway Management Planned: Natural Airway and Simple Face Mask  Additional Equipment:   Intra-op Plan:   Post-operative Plan:   Informed Consent: I have reviewed the patients History and Physical, chart, labs and discussed the procedure including the risks, benefits and alternatives for the proposed anesthesia with the patient or authorized representative who has indicated his/her understanding and acceptance.     Dental advisory given  Plan Discussed with: CRNA  Anesthesia Plan Comments:        Anesthesia Quick Evaluation

## 2023-12-15 NOTE — Op Note (Signed)
 Operative Note  Date of operation: 12/15/2023 Preoperative diagnosis: Left knee primary osteoarthritis Postoperative diagnosis: Same  Procedure: Left cemented total knee arthroplasty  Implants: Biomet/Zimmer persona cemented knee system Implant Name Type Inv. Item Serial No. Manufacturer Lot No. LRB No. Used Action  CEMENT BONE R 1X40 - [id number] Cement CEMENT BONE R 1X40  [employer](ORTH,TRAU,BIO,SG) [id number] Left 1 Implanted  CEMENT BONE R 1X40 - [id number] Cement CEMENT BONE R 1X40  [employer](ORTH,TRAU,BIO,SG) [id number] Left 1 Implanted  COMP FEM CMT PERS SZ 11 LT - [id number] Joint COMP FEM CMT PERS SZ 11 LT  [employer](ORTH,TRAU,BIO,SG) [id number] Left 1 Implanted  INSERT FIXED AS PERS SZ8-11 LT - [id number] Insert INSERT FIXED AS PERS SZ8-11 LT  [employer](ORTH,TRAU,BIO,SG) [id number] Left 1 Implanted  STEM POLY PAT PLY 60M KNEE - [id number] Knees STEM POLY PAT PLY 60M KNEE  [employer](ORTH,TRAU,BIO,SG) [id number] Left 1 Implanted  COMP TIB KNEE PS G 0D LT - [id number] Joint COMP TIB KNEE PS G 0D LT  [employer](ORTH,TRAU,BIO,SG) [id number] Left 1 Implanted   Surgeon: Peter GRADE. Vernetta, MD Assistant: Tory Gaskins, PA-C  Anesthesia: #1 left lower extremity adductor canal block, #2 spinal, #3 local Tourniquet time: Under 1 hour EBL: Less than 100 cc Antibiotics: IV Ancef  Complications: None  Indications: The patient is an 83 year old gentleman with debilitating arthritis involving his right knee.  We have seen him for a while for this knee.  Over 12 months now he has had worsening pain that is detrimentally affecting his mobility, his quality of life and his actives daily living.  He has tried and failed all forms conservative treatment at this point does wish to proceed with a knee replacement we agreed this as well.  We did discuss the risk of acute blood loss anemia, nerve vessel injury, fracture, infection, DVT, implant failure and wound healing issues.  He understands  that our goals are hopefully decreased pain, improved mobility and improved quality of life.  Procedure description: After informed consent was obtained and the appropriate left knee was marked, the patient was brought to the operating room and set up on the operating table spinal anesthesia was obtained.  Of note in the holding room and adductor canal block was also obtained by anesthesia of the left lower extremity.  He was then placed in supine position and they did attempt to place a Foley catheter but were unsuccessful.  There was a little bit of of blood visible so I had them abort placing the Foley catheter and will consult urology for assessment and hopefully placement of the Foley and then the case.  A nonsterile tractors placed around his upper left thigh and the left thigh, knee, leg and ankle were prepped and draped with DuraPrep and sterile drapes and given a sterile stockinette.  A timeout was called and he was notified is correct patient correct left knee.  An Esmarch was then used to wrap out the leg and the tourniquet was plated to 300 mm pressure.  With the knee extended he did have a slight flexion contracture.  We made a midline incision over patella and carried this proximally distally.  Dissection was carried down the knee joint and a medial parapatellar arthrotomy was made finding a large joint effusion.  With the knee in a flexed position we found significant cartilage loss throughout the knee.  We removed osteophytes from all 3 compartments as well as remnants of the ACL as well as medial lateral meniscus.  We then used an extramedullary  based cutting guide for making our proximal tibia cut correction for varus and valgus and a 7 degree slope.  We made this cut to take 2 mm off the low side and we did need to backed this down to more millimeters.  We then used an intramedullary based cutting guide for distal femur cut setting this for a left knee at 5 degrees externally rotated and a 10 mm  distal femoral cut.  We made that cut without difficulty and then brought the knee back down to full extension and had achieved full extension with a 10 mm extension block.  We then went back to the femur and put a femoral sizing guide based off the epicondylar axis.  Based off of this we chose a size 11 femur.  We put a 4-in-1 cutting block for a size 11 femur and made our anterior and posterior cuts followed our chamfer cuts.  We then impacted the tibia and chose a size G left tibial tray setting the rotation of the tibial tubercle and the femur and this did cover the tibial plateau nicely.  We did a drill hole and keel punch off of this.  We then trialed our size G left tibia combined with our size 11 left CR standard femur.  We placed a 10 mm left medial congruent polyethylene insert and I felt better going up to a 12 mm thickness insert and this still gave him full range of motion and stability.  We then made a patella cut and drilled 3 holes for a size 35 patella button.  Again with all trial rotation the knee we are pleased the range of motion and stability of the knee.  We then removed all transportation from the knee and irrigated the knee with normal saline solution.  We then placed Marcaine  with epinephrine  around the arthrotomy.  Next we mixed our cement and with the knee in a flexed position we cemented our Biomet/Zimmer tibial tray for a left knee size G followed by cementing our size 11 left CR standard femur.  We placed our 11 mm thickness medial congruent left polythene insert and cemented our size 35 patella button.  We then held the knee fully extended and compressed while the cement hardened.  Once the cemented hardened the tourniquet was let down and hemostasis was obtained with electrocautery.  The arthrotomy was then closed with interrupted #1 Vicryl suture followed by 0 Vicryl's deep tissue and 2-0 Vicryl close subcutaneous tissue.  Staples were used to close the skin.  Well-padded sterile  dressing was applied.  Once we were done with the case urology was consulted for assessment and Foley catheter placement.  Of note Tory Gaskins, PA-C did assist during entire case from beginning to end and his assistance was crucial and medically necessary for soft tissue management and retraction, helping guide implant placement and a layered closure of the wound.

## 2023-12-15 NOTE — Interval H&P Note (Signed)
 History and Physical Interval Note: Today the patient is here today for a left total knee replacement to treat his severe left knee arthritis.  There has been no acute or interval change in his medical status.  The risks and benefits of surgery have been discussed in detail and informed consent have been obtained.  The left operative knee has been marked.  12/15/2023 9:54 AM  Peter Blair  has presented today for surgery, with the diagnosis of Osteoarthritis Left Knee.  The various methods of treatment have been discussed with the patient and family. After consideration of risks, benefits and other options for treatment, the patient has consented to  Procedure(s): LEFT TOTAL KNEE ARTHROPLASTY (Left) as a surgical intervention.  The patient's history has been reviewed, patient examined, no change in status, stable for surgery.  I have reviewed the patient's chart and labs.  Questions were answered to the patient's satisfaction.     Lonni [name]

## 2023-12-15 NOTE — Anesthesia Procedure Notes (Signed)
 Spinal  Patient location during procedure: OR Start time: 12/15/2023 11:10 AM End time: 12/15/2023 11:20 AM Reason for block: surgical anesthesia Staffing Performed: anesthesiologist  Anesthesiologist: Corinne Garnette BRAVO, MD Performed by: Corinne Garnette BRAVO, MD Authorized by: Corinne Garnette BRAVO, MD   Preanesthetic Checklist Completed: patient identified, IV checked, risks and benefits discussed, surgical consent, monitors and equipment checked, pre-op evaluation and timeout performed Spinal Block Patient position: sitting Prep: DuraPrep and site prepped and draped Patient monitoring: continuous pulse ox and blood pressure Approach: midline Location: L3-4 Injection technique: single-shot Needle Needle type: Pencan and Quincke  Needle gauge: 22 G Assessment Events: CSF return Additional Notes Functioning IV was confirmed and monitors were applied. Sterile prep and drape, including hand hygiene, mask and sterile gloves were used. The patient was positioned and the spine was prepped. The skin was anesthetized with lidocaine .  Free flow of clear CSF was obtained prior to injecting local anesthetic into the CSF.  The spinal needle aspirated freely following injection.  The needle was carefully withdrawn.  The patient tolerated the procedure well. Consent was obtained prior to procedure with all questions answered and concerns addressed. Risks including but not limited to bleeding, infection, nerve damage, paralysis, failed block, inadequate analgesia, allergic reaction, high spinal, itching and headache were discussed and the patient wished to proceed.   Attempt x2 with Pencan, unsuccessful. Attempt x1 with Quinke at higher level with success.  Garnette Corinne, MD

## 2023-12-15 NOTE — Consult Note (Signed)
 The patient had a anterior urethral stricture that I dilated up under anesthesia.  He did also have a small false passage from initial Foley attempt.  The patient will need a Foley catheter for a week.  We will schedule him for follow-up in our clinic and a voiding trial.  At this point, since the catheter was placed under sterile conditions, I do not find it necessary for him to go home with any additional antibiotics or medications from a urologic prospective.  I will leave that part to orthopedic surgery.

## 2023-12-15 NOTE — Progress Notes (Signed)
 PT Note  Patient Details Name: Peter Blair MRN: [id number] DOB: 1941-08-15   Cancelled Treatment:    Reason Eval/Treat Not Completed: Pain limiting ability to participate. PT spoke with nurse whom reported pt was in severe pain and orders obtained for Toradol  and awaiting pharmacy to deliver. Nurse recommends holding PT eval until pain is better managed. PT to continue to follow acutely.   [place], PT Acute Rehab   [place] VEAR Drone 12/15/2023, 4:57 PM

## 2023-12-15 NOTE — Anesthesia Procedure Notes (Signed)
 Date/Time: 12/15/2023 11:07 AM  Performed by: Florene Route, CRNAOxygen Delivery Method: Simple face mask

## 2023-12-15 NOTE — Discharge Instructions (Addendum)
 Foley Catheter Care A soft, flexible tube (Foley catheter) may have been placed in your bladder to drain urine and fluid. Follow these instructions: Taking Care of the Catheter Keep the area where the catheter leaves your body clean.  Attach the catheter to the leg so there is no tension on the catheter.  Keep the drainage bag below the level of the bladder, but keep it OFF the floor.  Do not take long soaking baths. Your caregiver will give instructions about showering.  Wash your hands before touching ANYTHING related to the catheter or bag.  Using mild soap and warm water  on a washcloth:  Clean the area closest to the catheter insertion site using a circular motion around the catheter.  Clean the catheter itself by wiping AWAY from the insertion site for several inches down the tube.  NEVER wipe upward as this could sweep bacteria up into the urethra (tube in your body that normally drains the bladder) and cause infection.  Place a small amount of sterile lubricant at the tip of the penis where the catheter is entering.  Taking Care of the Drainage Bags Two drainage bags may be taken home: a large overnight drainage bag, and a smaller leg bag which fits underneath clothing.  It is okay to wear the overnight bag at any time, but NEVER wear the smaller leg bag at night.  Keep the drainage bag well below the level of your bladder. This prevents backflow of urine into the bladder and allows the urine to drain freely.  Anchor the tubing to your leg to prevent pulling or tension on the catheter. Use tape or a leg strap provided by the hospital.  Empty the drainage bag when it is 1/2 to 3/4 full. Wash your hands before and after touching the bag.  Periodically check the tubing for kinks to make sure there is no pressure on the tubing which could restrict the flow of urine.  Changing the Drainage Bags Cleanse both ends of the clean bag with alcohol before changing.  Pinch off the rubber catheter to  avoid urine spillage during the disconnection.  Disconnect the dirty bag and connect the clean one.  Empty the dirty bag carefully to avoid a urine spill.  Attach the new bag to the leg with tape or a leg strap.  Cleaning the Drainage Bags Whenever a drainage bag is disconnected, it must be cleaned quickly so it is ready for the next use.  Wash the bag in warm, soapy water .  Rinse the bag thoroughly with warm water .  Soak the bag for 30 minutes in a solution of white vinegar and water  (1 cup vinegar to 1 quart warm water ).  Rinse with warm water .  SEEK MEDICAL CARE IF:  You have chills or night sweats.  You are leaking around your catheter or have problems with your catheter. It is not uncommon to have sporadic leakage around your catheter as a result of bladder spasms. If the leakage stops, there is not much need for concern. If you are uncertain, call your caregiver.  You develop side effects that you think are coming from your medicines.  SEEK IMMEDIATE MEDICAL CARE IF:  You are suddenly unable to urinate. Check to see if there are any kinks in the drainage tubing that may cause this. If you cannot find any kinks, call your caregiver immediately. This is an emergency.  You develop shortness of breath or chest pains.  Bleeding persists or clots develop in your urine.  You have a fever.  You develop pain in your back or over your lower belly (abdomen).  You develop pain or swelling in your legs.  Any problems you are having get worse rather than better.  MAKE SURE YOU:  Understand these instructions.  Will watch your condition.  Will get help right away if you are not doing well or get worse.     INSTRUCTIONS AFTER JOINT REPLACEMENT   Remove items at home which could result in a fall. This includes throw rugs or furniture in walking pathways ICE to the affected joint every three hours while awake for 30 minutes at a time, for at least the first 3-5 days, and then as needed for pain  and swelling.  Continue to use ice for pain and swelling. You may notice swelling that will progress down to the foot and ankle.  This is normal after surgery.  Elevate your leg when you are not up walking on it.   Continue to use the breathing machine you got in the hospital (incentive spirometer) which will help keep your temperature down.  It is common for your temperature to cycle up and down following surgery, especially at night when you are not up moving around and exerting yourself.  The breathing machine keeps your lungs expanded and your temperature down.   DIET:  As you were doing prior to hospitalization, we recommend a well-balanced diet.  DRESSING / WOUND CARE / SHOWERING  Keep the surgical dressing until follow up.  The dressing is water  proof, so you can shower without any extra covering.  IF THE DRESSING FALLS OFF or the wound gets wet inside, change the dressing with sterile gauze.  Please use good hand washing techniques before changing the dressing.  Do not use any lotions or creams on the incision until instructed by your surgeon.    ACTIVITY  Increase activity slowly as tolerated, but follow the weight bearing instructions below.   No driving for 6 weeks or until further direction given by your physician.  You cannot drive while taking narcotics.  No lifting or carrying greater than 10 lbs. until further directed by your surgeon. Avoid periods of inactivity such as sitting longer than an hour when not asleep. This helps prevent blood clots.  You may return to work once you are authorized by your doctor.     WEIGHT BEARING   Weight bearing as tolerated with assist device (walker, cane, etc) as directed, use it as long as suggested by your surgeon or therapist, typically at least 4-6 weeks.   EXERCISES  Results after joint replacement surgery are often greatly improved when you follow the exercise, range of motion and muscle strengthening exercises prescribed by your  doctor. Safety measures are also important to protect the joint from further injury. Any time any of these exercises cause you to have increased pain or swelling, decrease what you are doing until you are comfortable again and then slowly increase them. If you have problems or questions, call your caregiver or physical therapist for advice.   Rehabilitation is important following a joint replacement. After just a few days of immobilization, the muscles of the leg can become weakened and shrink (atrophy).  These exercises are designed to build up the tone and strength of the thigh and leg muscles and to improve motion. Often times heat used for twenty to thirty minutes before working out will loosen up your tissues and help with improving the range of motion but do  not use heat for the first two weeks following surgery (sometimes heat can increase post-operative swelling).   These exercises can be done on a training (exercise) mat, on the floor, on a table or on a bed. Use whatever works the best and is most comfortable for you.    Use music or television while you are exercising so that the exercises are a pleasant break in your day. This will make your life better with the exercises acting as a break in your routine that you can look forward to.   Perform all exercises about fifteen times, three times per day or as directed.  You should exercise both the operative leg and the other leg as well.  Exercises include:   Quad Sets - Tighten up the muscle on the front of the thigh (Quad) and hold for 5-10 seconds.   Straight Leg Raises - With your knee straight (if you were given a brace, keep it on), lift the leg to 60 degrees, hold for 3 seconds, and slowly lower the leg.  Perform this exercise against resistance later as your leg gets stronger.  Leg Slides: Lying on your back, slowly slide your foot toward your buttocks, bending your knee up off the floor (only go as far as is comfortable). Then slowly slide  your foot back down until your leg is flat on the floor again.  Angel Wings: Lying on your back spread your legs to the side as far apart as you can without causing discomfort.  Hamstring Strength:  Lying on your back, push your heel against the floor with your leg straight by tightening up the muscles of your buttocks.  Repeat, but this time bend your knee to a comfortable angle, and push your heel against the floor.  You may put a pillow under the heel to make it more comfortable if necessary.   A rehabilitation program following joint replacement surgery can speed recovery and prevent re-injury in the future due to weakened muscles. Contact your doctor or a physical therapist for more information on knee rehabilitation.    CONSTIPATION  Constipation is defined medically as fewer than three stools per week and severe constipation as less than one stool per week.  Even if you have a regular bowel pattern at home, your normal regimen is likely to be disrupted due to multiple reasons following surgery.  Combination of anesthesia, postoperative narcotics, change in appetite and fluid intake all can affect your bowels.   YOU MUST use at least one of the following options; they are listed in order of increasing strength to get the job done.  They are all available over the counter, and you may need to use some, POSSIBLY even all of these options:    Drink plenty of fluids (prune juice may be helpful) and high fiber foods Colace 100 mg by mouth twice a day  Senokot for constipation as directed and as needed Dulcolax (bisacodyl), take with full glass of water   Miralax (polyethylene glycol) once or twice a day as needed.  If you have tried all these things and are unable to have a bowel movement in the first 3-4 days after surgery call either your surgeon or your primary doctor.    If you experience loose stools or diarrhea, hold the medications until you stool forms back up.  If your symptoms do not  get better within 1 week or if they get worse, check with your doctor.  If you experience the worst abdominal pain  ever or develop nausea or vomiting, please contact the office immediately for further recommendations for treatment.   ITCHING:  If you experience itching with your medications, try taking only a single pain pill, or even half a pain pill at a time.  You can also use Benadryl  over the counter for itching or also to help with sleep.   TED HOSE STOCKINGS:  Use stockings on both legs until for at least 2 weeks or as directed by physician office. They may be removed at night for sleeping.  MEDICATIONS:  See your medication summary on the "After Visit Summary" that nursing will review with you.  You may have some home medications which will be placed on hold until you complete the course of blood thinner medication.  It is important for you to complete the blood thinner medication as prescribed.  PRECAUTIONS:  If you experience chest pain or shortness of breath - call 911 immediately for transfer to the hospital emergency department.   If you develop a fever greater that 101 F, purulent drainage from wound, increased redness or drainage from wound, foul odor from the wound/dressing, or calf pain - CONTACT YOUR SURGEON.                                                   FOLLOW-UP APPOINTMENTS:  If you do not already have a post-op appointment, please call the office for an appointment to be seen by your surgeon.  Guidelines for how soon to be seen are listed in your "After Visit Summary", but are typically between 1-4 weeks after surgery.  OTHER INSTRUCTIONS:   Knee Replacement:  Do not place pillow under knee, focus on keeping the knee straight while resting. CPM instructions: 0-90 degrees, 2 hours in the morning, 2 hours in the afternoon, and 2 hours in the evening. Place foam block, curve side up under heel at all times except when in CPM or when walking.  DO NOT modify, tear, cut, or  change the foam block in any way.  POST-OPERATIVE OPIOID TAPER INSTRUCTIONS: It is important to wean off of your opioid medication as soon as possible. If you do not need pain medication after your surgery it is ok to stop day one. Opioids include: Codeine, Hydrocodone (Norco, Vicodin), Oxycodone (Percocet, oxycontin ) and hydromorphone  amongst others.  Long term and even short term use of opiods can cause: Increased pain response Dependence Constipation Depression Respiratory depression And more.  Withdrawal symptoms can include Flu like symptoms Nausea, vomiting And more Techniques to manage these symptoms Hydrate well Eat regular healthy meals Stay active Use relaxation techniques(deep breathing, meditating, yoga) Do Not substitute Alcohol to help with tapering If you have been on opioids for less than two weeks and do not have pain than it is ok to stop all together.  Plan to wean off of opioids This plan should start within one week post op of your joint replacement. Maintain the same interval or time between taking each dose and first decrease the dose.  Cut the total daily intake of opioids by one tablet each day Next start to increase the time between doses. The last dose that should be eliminated is the evening dose.   MAKE SURE YOU:  Understand these instructions.  Get help right away if you are not doing well or get worse.    Thank  you for letting us  be a part of your medical care team.  It is a privilege we respect greatly.  We hope these instructions will help you stay on track for a fast and full recovery!     Dental Antibiotics:  In most cases prophylactic antibiotics for Dental procdeures after total joint surgery are not necessary.  Exceptions are as follows:  1. History of prior total joint infection  2. Severely immunocompromised (Organ Transplant, cancer chemotherapy, Rheumatoid biologic meds such as Humera)  3. Poorly controlled diabetes (A1C &gt;  8.0, blood glucose over 200)  If you have one of these conditions, contact your surgeon for an antibiotic prescription, prior to your dental procedure.   Per [facility] clinic policy, our goal is ensure optimal postoperative pain control with a multimodal pain management strategy. For all [facility] patients, our goal is to wean post-operative narcotic medications by 6 weeks post-operatively. If this is not possible due to utilization of pain medication prior to surgery, your [facility] doctor will support your acute post-operative pain control for the first 6 weeks postoperatively, with a plan to transition you back to your primary pain team following that. [facility] will work to ensure a therapist, occupational.

## 2023-12-15 NOTE — Op Note (Signed)
 Preoperative diagnosis:  Urethral false passage  Postoperative diagnosis:  Anterior urethral stricture  Procedure: Cystourethroscopy Urethral dilation Foley catheter placement  Surgeon: Morene MICAEL Salines, MD  Anesthesia: General  Complications: None  Intraoperative findings:  #1: The patient had a false passage in the penile urethra 3 to 4 cm from the meatus adjacent to a narrowed part of the lumen. #2: The scar was dilated to 61 French and an 40 French council tip catheter was advanced over the wire and into the bladder.  EBL: Minimal  Specimens: None  Indication: Peter Blair is a 83 y.o. patient who was under anesthesia for a left knee replacement.  At the beginning of the case there was some difficulty getting the catheter placed with blood at the urethral meatus.  As such, no catheter was placed at that time, and the surgery proceeded.  At the end of the case, I was consulted for further evaluation and management assistance with placing a Foley catheter.  Description of procedure:  The patient was already under anesthesia.  He was in the supine position.  I did have to cut down through some of the drapes for his knee in order to reach his penis.  Once down there we toweled it off with sterile towels and use Betadine  to clean the tip of the penis and urethra.  I gently attempted a 72 French coud tip catheter unsuccessfully once.  I then advanced a 16 French flexible cystoscope through the patient's urethral meatus and to the area of concern.  I was unable to advance the tip of the scope through the scar tissue, and instead advanced a wire and remove the scope over the wire.  I then dilated the urethra to 23 French using long straight Psychiatric Nurse.  I dilated from 43 French to 28 French with relative ease.  I then advanced an 25 French council tip Foley catheter over the wire and into the patient's bladder.  Clear urine was noted to return.  10 cc  of sterile water  was inflated into the balloon of the catheter.  The patient was subsequently awoken, extubated, and returned the PACU stable position.  Disposition: The patient will be scheduled for a voiding trial in 1 week.

## 2023-12-16 LAB — BASIC METABOLIC PANEL
Anion gap: 8 (ref 5–15)
BUN: 20 mg/dL (ref 8–23)
CO2: 21 mmol/L — ABNORMAL LOW (ref 22–32)
Calcium: 9.1 mg/dL (ref 8.9–10.3)
Chloride: 105 mmol/L (ref 98–111)
Creatinine, Ser: 0.99 mg/dL (ref 0.61–1.24)
GFR, Estimated: >60 mL/min (ref >60)
Glucose, Bld: 253 mg/dL — ABNORMAL HIGH (ref 70–99)
Potassium: 4.4 mmol/L (ref 3.5–5.1)
Sodium: 134 mmol/L — ABNORMAL LOW (ref 135–145)

## 2023-12-16 LAB — CBC
HCT: 34.8 % — ABNORMAL LOW (ref 39.0–52.0)
Hemoglobin: 12.4 g/dL — ABNORMAL LOW (ref 13.0–17.0)
MCH: 32.2 pg (ref 26.0–34.0)
MCHC: 35.6 g/dL (ref 30.0–36.0)
MCV: 90.4 fL (ref 80.0–100.0)
Platelets: 143 10*3/uL — ABNORMAL LOW (ref 150–400)
RBC: 3.85 MIL/uL — ABNORMAL LOW (ref 4.22–5.81)
RDW: 12.7 % (ref 11.5–15.5)
WBC: 9.7 10*3/uL (ref 4.0–10.5)
nRBC: 0 % (ref 0.0–0.2)

## 2023-12-16 LAB — GLUCOSE, CAPILLARY
Glucose-Capillary: 191 mg/dL — ABNORMAL HIGH (ref 70–99)
Glucose-Capillary: 212 mg/dL — ABNORMAL HIGH (ref 70–99)
Glucose-Capillary: 170 mg/dL — ABNORMAL HIGH (ref 70–99)
Glucose-Capillary: 146 mg/dL — ABNORMAL HIGH (ref 70–99)

## 2023-12-16 MED ORDER — OXYCODONE HCL 5 MG PO TABS: 5.0000 mg | ORAL_TABLET | Freq: Four times a day (QID) | ORAL | 0 refills | Status: DC | PRN

## 2023-12-16 MED ORDER — CHLORHEXIDINE GLUCONATE CLOTH 2 % EX PADS: 6.0000 | MEDICATED_PAD | Freq: Every day | CUTANEOUS | Status: DC

## 2023-12-16 MED ORDER — CHLORHEXIDINE GLUCONATE CLOTH 2 % EX PADS
6.0000 | MEDICATED_PAD | Freq: Every day | CUTANEOUS | Status: DC
  Administered 2023-12-16 – 2023-12-18 (×3): 6 via TOPICAL

## 2023-12-16 MED ORDER — KETOROLAC TROMETHAMINE 15 MG/ML IJ SOLN: 7.5000 mg | Freq: Four times a day (QID) | INTRAMUSCULAR | Status: DC

## 2023-12-16 MED ORDER — ASPIRIN 81 MG PO CHEW: 81.0000 mg | CHEWABLE_TABLET | Freq: Two times a day (BID) | ORAL | 0 refills | Status: DC

## 2023-12-16 MED ORDER — INSULIN ASPART 100 UNIT/ML IJ SOLN
0.0000 [IU] | Freq: Three times a day (TID) | INTRAMUSCULAR | Status: DC
  Administered 2023-12-16 (×2): 3 [IU] via SUBCUTANEOUS
  Administered 2023-12-16 – 2023-12-17 (×2): 5 [IU] via SUBCUTANEOUS
  Administered 2023-12-17: 3 [IU] via SUBCUTANEOUS
  Administered 2023-12-17: 2 [IU] via SUBCUTANEOUS
  Administered 2023-12-18: 3 [IU] via SUBCUTANEOUS
  Administered 2023-12-18: 5 [IU] via SUBCUTANEOUS

## 2023-12-16 NOTE — TOC Initial Note (Addendum)
 Transition of Care [facility]) - Initial/Assessment Note    Patient Details  Name: Peter Blair MRN: [id number] Date of Birth: Aug 09, 1941  Transition of Care [facility]) CM/SW Contact:    Tawni CHRISTELLA Eva, LCSW Phone Number: 12/16/2023, 12:33 PM  Clinical Narrative:                 CSW spoke with the pt to discuss the recommended DME. The pt only wants a walker, stating he has his wife as assistance. Pt has no preference for a DME company, and a referral was made to [employer]. The walker will be delivered to the pt's room prior to discharge. The pt was prearranged with [facility].  Pt has no transportation needs. TOC sign-off  Expected Discharge Plan: Home w Home Health Services Barriers to Discharge: Continued Medical Work up   Patient Goals and CMS Choice Patient states their goals for this hospitalization and ongoing recovery are:: retrun home with home health          Expected Discharge Plan and Services       Living arrangements for the past 2 months: Single Family Home                           HH Arranged: PT HH Agency: [facility]        Prior Living Arrangements/Services Living arrangements for the past 2 months: Single Family Home Lives with:: Self, Spouse   Do you feel safe going back to the place where you live?: Yes      Need for Family Participation in Patient Care: No (Comment) Care giver support system in place?: No (comment) Current home services: DME Criminal Activity/Legal Involvement Pertinent to Current Situation/Hospitalization: No - Comment as needed  Activities of Daily Living   ADL Screening (condition at time of admission) Independently performs ADLs?: Yes (appropriate for developmental age) Is the patient deaf or have difficulty hearing?: No Does the patient have difficulty seeing, even when wearing glasses/contacts?: No Does the patient have difficulty concentrating, remembering, or making decisions?: No  Permission  Sought/Granted                  Emotional Assessment Appearance:: Appears stated age Attitude/Demeanor/Rapport: Gracious, Engaged Affect (typically observed): Accepting Orientation: : Oriented to Self, Oriented to Place, Oriented to  Time, Oriented to Situation   Psych Involvement: No (comment)  Admission diagnosis:  Status post total left knee replacement [Z96.652] Patient Active Problem List   Diagnosis Date Noted   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/15/2023   Unilateral primary osteoarthritis, left knee 09/25/2023   Unilateral primary osteoarthritis, right hip 09/25/2023   Actinic keratosis 10/17/2018   Type 2 diabetes mellitus with other circulatory complications (HCC) 12/11/2017   Osteoarthritis, multiple sites 10/11/2017   Preventative health care 08/05/2015   Advance directive discussed with patient 08/05/2015   GERD (gastroesophageal reflux disease)    Hyperplastic colonic polyp 12/17/2013   Benign prostatic hyperplasia with urinary obstruction 01/16/2013   ED (erectile dysfunction) of organic origin 01/16/2013   Coronary atherosclerosis of native coronary artery 11/20/2009   NEPHROLITHIASIS 09/29/2009   RENAL CYST, RIGHT 09/29/2009   Hyperlipemia 09/16/2008   SKIN CANCER, HX OF 09/16/2008   Essential hypertension, benign 07/09/2007   PCP:  Jimmy Charlie FERNS, MD Pharmacy:   [id number] - [place], [place] - [address] [place]  [address] Phone: [phone] Fax: [phone]     Social  Drivers of Health (SDOH) Social History: SDOH Screenings   Food Insecurity: No Food Insecurity (12/15/2023)  Housing: Low Risk  (12/15/2023)  Transportation Needs: No Transportation Needs (12/15/2023)  Utilities: Not At Risk (12/15/2023)  Depression (PHQ2-9): Low Risk  (05/25/2023)  Social Connections: Patient Declined (12/15/2023)  Tobacco Use: Medium Risk (12/15/2023)   SDOH Interventions:     Readmission Risk Interventions      No data to display

## 2023-12-16 NOTE — Evaluation (Signed)
 Physical Therapy Evaluation Patient Details Name: Peter Blair MRN: [id number] DOB: [birth year] Today's Date: 12/16/2023  History of Present Illness  Pt s/p L TKR and with hx of CAD, DM.  Pt also c/o pre-existing R hip pain aggravated by ABD  Clinical Impression  Pt s/p L TKR and presents with decreased L LE strength/ROM and post op pain limiting functional mobility.  Pt should progress to dc home with family assist, will need RW for home use and reports follow up HHPT.        If plan is discharge home, recommend the following: A little help with walking and/or transfers;A little help with bathing/dressing/bathroom;Assistance with cooking/housework;Assist for transportation;Help with stairs or ramp for entrance   Can travel by private vehicle        Equipment Recommendations Rolling walker (2 wheels)  Recommendations for Other Services       Functional Status Assessment Patient has had a recent decline in their functional status and demonstrates the ability to make significant improvements in function in a reasonable and predictable amount of time.     Precautions / Restrictions Precautions Precautions: Knee Required Braces or Orthoses: Knee Immobilizer - Left Knee Immobilizer - Left: Discontinue once straight leg raise with < 10 degree lag Restrictions Weight Bearing Restrictions Per Provider Order: No      Mobility  Bed Mobility Overal bed mobility: Needs Assistance Bed Mobility: Supine to Sit     Supine to sit: Min assist     General bed mobility comments: Increased time with cues for sequence    Transfers Overall transfer level: Needs assistance Equipment used: Rolling walker (2 wheels) Transfers: Sit to/from Stand Sit to Stand: Min assist, Mod assist           General transfer comment: cues for LE management and use of UEs to self assist    Ambulation/Gait Ambulation/Gait assistance: Min assist Gait Distance (Feet): 38 Feet Assistive device: Rolling  walker (2 wheels) Gait Pattern/deviations: Step-to pattern, Decreased step length - right, Decreased step length - left, Shuffle, Antalgic, Trunk flexed Gait velocity: decr     General Gait Details: Increased time with cues for squence, posture and position from [employer]            Wheelchair Mobility     Tilt Bed    Modified Rankin (Stroke Patients Only)       Balance                                             Pertinent Vitals/Pain Pain Assessment Pain Assessment: 0-10 Pain Score: 5  Pain Location: L knee with activity Pain Descriptors / Indicators: Aching, Sore Pain Intervention(s): Limited activity within patient's tolerance, Monitored during session, Premedicated before session, Ice applied    Home Living Family/patient expects to be discharged to:: Private residence Living Arrangements: Spouse/significant other Available Help at Discharge: Family;Available 24 hours/day Type of Home: House Home Access: Stairs to enter Entrance Stairs-Rails: Right Entrance Stairs-Number of Steps: 2   Home Layout: One level Home Equipment: Cane - single point      Prior Function Prior Level of Function : Independent/Modified Independent             Mobility Comments: had been using cane 2* R hip pain       Extremity/Trunk Assessment   Upper Extremity Assessment Upper Extremity Assessment: Overall [facility]  for tasks assessed    Lower Extremity Assessment Lower Extremity Assessment: LLE deficits/detail LLE Deficits / Details: 2+/5 quads with AAROM at knee -5 - 35    Cervical / Trunk Assessment Cervical / Trunk Assessment: Normal  Communication   Communication Communication: No apparent difficulties  Cognition Arousal: Alert Behavior During Therapy: WFL for tasks assessed/performed Overall Cognitive Status: Within Functional Limits for tasks assessed                                          General Comments       Exercises Total Joint Exercises Ankle Circles/Pumps: AROM, Both, 15 reps, Supine Quad Sets: AROM, Both, 10 reps, Supine Heel Slides: AAROM, Left, 15 reps, Supine Straight Leg Raises: AAROM, Left, 10 reps, Supine   Assessment/Plan    PT Assessment Patient needs continued PT services  PT Problem List Decreased strength;Decreased range of motion;Decreased activity tolerance;Decreased balance;Decreased mobility;Decreased knowledge of use of DME;Pain       PT Treatment Interventions DME instruction;Gait training;Stair training;Functional mobility training;Therapeutic activities;Therapeutic exercise;Patient/family education    PT Goals (Current goals can be found in the Care Plan section)  Acute Rehab PT Goals Patient Stated Goal: Regain IND PT Goal Formulation: With patient Time For Goal Achievement: 12/23/23 Potential to Achieve Goals: Good    Frequency 7X/week     Co-evaluation               AM-PAC PT 6 Clicks Mobility  Outcome Measure Help needed turning from your back to your side while in a flat bed without using bedrails?: A Little Help needed moving from lying on your back to sitting on the side of a flat bed without using bedrails?: A Little Help needed moving to and from a bed to a chair (including a wheelchair)?: A Little Help needed standing up from a chair using your arms (e.g., wheelchair or bedside chair)?: A Little Help needed to walk in hospital room?: A Little Help needed climbing 3-5 steps with a railing? : A Lot 6 Click Score: 17    End of Session Equipment Utilized During Treatment: Gait belt;Left knee immobilizer Activity Tolerance: Patient tolerated treatment well Patient left: in chair;with call bell/phone within reach;with chair alarm set;with family/visitor present Nurse Communication: Mobility status PT Visit Diagnosis: Difficulty in walking, not elsewhere classified (R26.2)    Time: 8949-8871 PT Time Calculation (min) (ACUTE ONLY): 38  min   Charges:   PT Evaluation $PT Eval Low Complexity: 1 Low PT Treatments $Gait Training: 8-22 mins $Therapeutic Exercise: 8-22 mins PT General Charges $$ ACUTE PT VISIT: 1 Visit         Katrinka Acton PT Acute Rehabilitation Services Pager [phone] Office [phone]   Sadako Cegielski 12/16/2023, 12:39 PM

## 2023-12-16 NOTE — Progress Notes (Signed)
 Physical Therapy Treatment Patient Details Name: Peter Blair MRN: [id number] DOB: 03/25/1941 Today's Date: 12/16/2023   History of Present Illness Pt s/p L TKR and with hx of CAD, DM.  Pt also c/o pre-existing R hip pain aggravated by ABD    PT Comments  Pt continues cooperative but limited this pm by elevated pain level - RN aware and providing additional pain meds.    If plan is discharge home, recommend the following: A little help with walking and/or transfers;A little help with bathing/dressing/bathroom;Assistance with cooking/housework;Assist for transportation;Help with stairs or ramp for entrance   Can travel by private vehicle        Equipment Recommendations  Rolling walker (2 wheels)    Recommendations for Other Services       Precautions / Restrictions Precautions Precautions: Knee Required Braces or Orthoses: Knee Immobilizer - Left Knee Immobilizer - Left: Discontinue once straight leg raise with < 10 degree lag Restrictions Weight Bearing Restrictions Per Provider Order: No     Mobility  Bed Mobility Overal bed mobility: Needs Assistance Bed Mobility: Sit to Supine     Supine to sit: Min assist Sit to supine: Min assist   General bed mobility comments: Increased time with cues for sequence    Transfers Overall transfer level: Needs assistance Equipment used: Rolling walker (2 wheels) Transfers: Sit to/from Stand Sit to Stand: Min assist, Mod assist           General transfer comment: cues for LE management and use of UEs to self assist    Ambulation/Gait Ambulation/Gait assistance: Min assist Gait Distance (Feet): 22 Feet Assistive device: Rolling walker (2 wheels) Gait Pattern/deviations: Step-to pattern, Decreased step length - right, Decreased step length - left, Shuffle, Antalgic, Trunk flexed Gait velocity: decr     General Gait Details: Increased time with cues for squence, posture and position from [employer] Mobility     Tilt Bed    Modified Rankin (Stroke Patients Only)       Balance Overall balance assessment: Needs assistance Sitting-balance support: No upper extremity supported, Feet supported Sitting balance-Leahy Scale: Good     Standing balance support: Bilateral upper extremity supported Standing balance-Leahy Scale: Poor                              Cognition Arousal: Alert Behavior During Therapy: WFL for tasks assessed/performed Overall Cognitive Status: Within Functional Limits for tasks assessed                                          Exercises Total Joint Exercises Ankle Circles/Pumps: AROM, Both, 15 reps, Supine Quad Sets: AROM, Both, 10 reps, Supine Heel Slides: AAROM, Left, 15 reps, Supine Straight Leg Raises: AAROM, Left, 10 reps, Supine    General Comments        Pertinent Vitals/Pain Pain Assessment Pain Assessment: 0-10 Pain Score: 8  Pain Location: L knee with activity Pain Descriptors / Indicators: Aching, Sore Pain Intervention(s): Limited activity within patient's tolerance, Monitored during session, Patient requesting pain meds-RN notified, Ice applied    Home Living                          Prior Function  PT Goals (current goals can now be found in the care plan section) Acute Rehab PT Goals Patient Stated Goal: Regain IND PT Goal Formulation: With patient Time For Goal Achievement: 12/23/23 Potential to Achieve Goals: Good Progress towards PT goals: Not progressing toward goals - comment (elevated pain level)    Frequency    7X/week      PT Plan      Co-evaluation              AM-PAC PT 6 Clicks Mobility   Outcome Measure  Help needed turning from your back to your side while in a flat bed without using bedrails?: A Little Help needed moving from lying on your back to sitting on the side of a flat bed without using bedrails?: A Little Help  needed moving to and from a bed to a chair (including a wheelchair)?: A Little Help needed standing up from a chair using your arms (e.g., wheelchair or bedside chair)?: A Little Help needed to walk in hospital room?: A Little Help needed climbing 3-5 steps with a railing? : A Lot 6 Click Score: 17    End of Session Equipment Utilized During Treatment: Gait belt;Left knee immobilizer Activity Tolerance: Patient limited by pain Patient left: in bed;with call bell/phone within reach;with bed alarm set Nurse Communication: Mobility status PT Visit Diagnosis: Difficulty in walking, not elsewhere classified (R26.2)     Time: 1450-1510 PT Time Calculation (min) (ACUTE ONLY): 20 min  Charges:    $Gait Training: 8-22 mins PT General Charges $$ ACUTE PT VISIT: 1 Visit                     Katrinka Acton PT Acute Rehabilitation Services Pager [phone] Office [phone]    Karnisha Lefebre 12/16/2023, 4:55 PM

## 2023-12-16 NOTE — Progress Notes (Signed)
 Orthopedic Surgery Progress Note   Assessment: Patient is a 83 y.o. male with left knee OA status post TKA   Plan: -Operative plans: complete -Diet: diabetic -DVT ppx: aspirin  81mg  BID -Weight bearing status: as tolerated -PT evaluate and treat -Pain control -Dispo: remain floor status  ___________________________________________________________________________  Subjective: No acute events overnight. Having a lot of pain in the knee. Has not worked with PT yet. Found toradol  helpful for controlling his pain.    Physical Exam:  General: no acute distress, appears stated age Neurologic: alert, answering questions appropriately, following commands Respiratory: unlabored breathing on room air, symmetric chest rise Psychiatric: appropriate affect, normal cadence to speech  MSK:   -Left lower extremity  Dressing over knee c/d/i EHL/TA/GSC intact Plantarflexes and dorsiflexes toes Sensation intact to light touch in sural, saphenous, tibial, deep peroneal, and superficial peroneal nerve distributions Foot warm and well perfused   Patient name: Peter Blair Patient MRN: [id number] Date: 12/16/23

## 2023-12-16 NOTE — Care Management Obs Status (Signed)
 MEDICARE OBSERVATION STATUS NOTIFICATION   Patient Details  Name: Peter Blair MRN: [id number] Date of Birth: [birth year]   Medicare Observation Status Notification Given:   Yes    Maryjean Ka, LCSW 12/16/2023, 12:58 PM

## 2023-12-17 DIAGNOSIS — Z85828 Personal history of other malignant neoplasm of skin: Secondary | ICD-10-CM | POA: Diagnosis not present

## 2023-12-17 DIAGNOSIS — Z961 Presence of intraocular lens: Secondary | ICD-10-CM | POA: Diagnosis present

## 2023-12-17 DIAGNOSIS — Z9842 Cataract extraction status, left eye: Secondary | ICD-10-CM | POA: Diagnosis not present

## 2023-12-17 DIAGNOSIS — I2510 Atherosclerotic heart disease of native coronary artery without angina pectoris: Secondary | ICD-10-CM | POA: Diagnosis present

## 2023-12-17 DIAGNOSIS — E785 Hyperlipidemia, unspecified: Secondary | ICD-10-CM | POA: Diagnosis present

## 2023-12-17 DIAGNOSIS — Z87891 Personal history of nicotine dependence: Secondary | ICD-10-CM | POA: Diagnosis not present

## 2023-12-17 DIAGNOSIS — Z955 Presence of coronary angioplasty implant and graft: Secondary | ICD-10-CM | POA: Diagnosis not present

## 2023-12-17 DIAGNOSIS — Z803 Family history of malignant neoplasm of breast: Secondary | ICD-10-CM | POA: Diagnosis not present

## 2023-12-17 DIAGNOSIS — Z833 Family history of diabetes mellitus: Secondary | ICD-10-CM | POA: Diagnosis not present

## 2023-12-17 DIAGNOSIS — N365 Urethral false passage: Secondary | ICD-10-CM | POA: Diagnosis present

## 2023-12-17 DIAGNOSIS — Z9841 Cataract extraction status, right eye: Secondary | ICD-10-CM | POA: Diagnosis not present

## 2023-12-17 DIAGNOSIS — E119 Type 2 diabetes mellitus without complications: Secondary | ICD-10-CM | POA: Diagnosis present

## 2023-12-17 DIAGNOSIS — N401 Enlarged prostate with lower urinary tract symptoms: Secondary | ICD-10-CM | POA: Diagnosis present

## 2023-12-17 DIAGNOSIS — Z91041 Radiographic dye allergy status: Secondary | ICD-10-CM | POA: Diagnosis not present

## 2023-12-17 DIAGNOSIS — Z8249 Family history of ischemic heart disease and other diseases of the circulatory system: Secondary | ICD-10-CM | POA: Diagnosis not present

## 2023-12-17 DIAGNOSIS — Z800 Family history of malignant neoplasm of digestive organs: Secondary | ICD-10-CM | POA: Diagnosis not present

## 2023-12-17 DIAGNOSIS — Z79899 Other long term (current) drug therapy: Secondary | ICD-10-CM | POA: Diagnosis not present

## 2023-12-17 DIAGNOSIS — I10 Essential (primary) hypertension: Secondary | ICD-10-CM | POA: Diagnosis present

## 2023-12-17 DIAGNOSIS — Z8049 Family history of malignant neoplasm of other genital organs: Secondary | ICD-10-CM | POA: Diagnosis not present

## 2023-12-17 DIAGNOSIS — Z7984 Long term (current) use of oral hypoglycemic drugs: Secondary | ICD-10-CM | POA: Diagnosis not present

## 2023-12-17 DIAGNOSIS — Z7982 Long term (current) use of aspirin: Secondary | ICD-10-CM | POA: Diagnosis not present

## 2023-12-17 DIAGNOSIS — Z87442 Personal history of urinary calculi: Secondary | ICD-10-CM | POA: Diagnosis not present

## 2023-12-17 DIAGNOSIS — Z860102 Personal history of hyperplastic colon polyps: Secondary | ICD-10-CM | POA: Diagnosis not present

## 2023-12-17 DIAGNOSIS — M1712 Unilateral primary osteoarthritis, left knee: Secondary | ICD-10-CM | POA: Diagnosis present

## 2023-12-17 LAB — GLUCOSE, CAPILLARY
Glucose-Capillary: 148 mg/dL — ABNORMAL HIGH (ref 70–99)
Glucose-Capillary: 173 mg/dL — ABNORMAL HIGH (ref 70–99)
Glucose-Capillary: 206 mg/dL — ABNORMAL HIGH (ref 70–99)
Glucose-Capillary: 189 mg/dL — ABNORMAL HIGH (ref 70–99)

## 2023-12-17 MED ORDER — KETOROLAC TROMETHAMINE 15 MG/ML IJ SOLN
7.5000 mg | Freq: Once | INTRAMUSCULAR | Status: AC
Start: 2023-12-17 — End: 2023-12-17
  Administered 2023-12-17: 7.5 mg via INTRAVENOUS
  Filled 2023-12-17: qty 1

## 2023-12-17 NOTE — Progress Notes (Signed)
 Patient ID: Peter Blair, male   DOB: [birth year], 83 y.o.   MRN: [id number] The patient's vital signs and left operative knee are stable.  However, he is struggling with mobility and somewhat with pain control.  I will order 1 dose of Toradol  for this morning.  If he is doing well with the afternoon session of therapy, he can be discharged to home.  However I am fine with him staying another day if that is needed for mobility purposes.

## 2023-12-17 NOTE — Progress Notes (Signed)
 Physical Therapy Treatment Patient Details Name: Peter Blair MRN: [id number] DOB: 06-03-1941 Today's Date: 12/17/2023   History of Present Illness Pt s/p L TKR and with hx of CAD, DM.  Pt also c/o pre-existing R hip pain aggravated by ABD    PT Comments  Pt continues cooperative but slow progress with mobility 2* c/o pain, lightheadedness and nausea with attempts to ambulate.     If plan is discharge home, recommend the following: A little help with walking and/or transfers;A little help with bathing/dressing/bathroom;Assistance with cooking/housework;Assist for transportation;Help with stairs or ramp for entrance   Can travel by private vehicle        Equipment Recommendations  Rolling walker (2 wheels)    Recommendations for Other Services       Precautions / Restrictions Precautions Precautions: Knee Required Braces or Orthoses: Knee Immobilizer - Left Knee Immobilizer - Left: Discontinue once straight leg raise with < 10 degree lag Restrictions Weight Bearing Restrictions Per Provider Order: No     Mobility  Bed Mobility Overal bed mobility: Needs Assistance Bed Mobility: Sit to Supine     Supine to sit: Min assist Sit to supine: Min assist   General bed mobility comments: Increased time with cues for sequence    Transfers Overall transfer level: Needs assistance Equipment used: Rolling walker (2 wheels) Transfers: Sit to/from Stand Sit to Stand: Min assist, From elevated surface           General transfer comment: cues for LE management and use of UEs to self assist    Ambulation/Gait Ambulation/Gait assistance: Min assist Gait Distance (Feet): 22 Feet Assistive device: Rolling walker (2 wheels) Gait Pattern/deviations: Step-to pattern, Decreased step length - right, Decreased step length - left, Shuffle, Antalgic, Trunk flexed Gait velocity: decr     General Gait Details: Increased time with cues for squence, posture and position from RW;  distance ltd by pain and c/o feeling lightheaded/nauseous   Stairs             Wheelchair Mobility     Tilt Bed    Modified Rankin (Stroke Patients Only)       Balance Overall balance assessment: Needs assistance Sitting-balance support: No upper extremity supported, Feet supported Sitting balance-Leahy Scale: Good     Standing balance support: Bilateral upper extremity supported Standing balance-Leahy Scale: Poor                              Cognition Arousal: Alert Behavior During Therapy: WFL for tasks assessed/performed Overall Cognitive Status: Within Functional Limits for tasks assessed                                          Exercises Total Joint Exercises Ankle Circles/Pumps: AROM, Both, 15 reps, Supine Quad Sets: AROM, Both, 10 reps, Supine Heel Slides: AAROM, Left, 15 reps, Supine Straight Leg Raises: AAROM, Left, Supine, 20 reps    General Comments        Pertinent Vitals/Pain Pain Assessment Pain Assessment: 0-10 Pain Score: 6  Pain Location: L knee with activity Pain Descriptors / Indicators: Aching, Sore, Guarding, Grimacing Pain Intervention(s): Limited activity within patient's tolerance, Monitored during session, Premedicated before session, Ice applied    Home Living  Prior Function            PT Goals (current goals can now be found in the care plan section) Acute Rehab PT Goals Patient Stated Goal: Regain IND PT Goal Formulation: With patient Time For Goal Achievement: 12/23/23 Potential to Achieve Goals: Good Progress towards PT goals: Progressing toward goals    Frequency    7X/week      PT Plan      Co-evaluation              AM-PAC PT 6 Clicks Mobility   Outcome Measure  Help needed turning from your back to your side while in a flat bed without using bedrails?: A Little Help needed moving from lying on your back to sitting on the side  of a flat bed without using bedrails?: A Little Help needed moving to and from a bed to a chair (including a wheelchair)?: A Little Help needed standing up from a chair using your arms (e.g., wheelchair or bedside chair)?: A Little Help needed to walk in hospital room?: A Little Help needed climbing 3-5 steps with a railing? : A Lot 6 Click Score: 17    End of Session Equipment Utilized During Treatment: Gait belt;Left knee immobilizer Activity Tolerance: Patient limited by pain Patient left: in bed;with call bell/phone within reach;with bed alarm set Nurse Communication: Mobility status PT Visit Diagnosis: Difficulty in walking, not elsewhere classified (R26.2)     Time: 8496-8472 PT Time Calculation (min) (ACUTE ONLY): 24 min  Charges:    $Gait Training: 8-22 mins $Therapeutic Exercise: 8-22 mins PT General Charges $$ ACUTE PT VISIT: 1 Visit                     Katrinka Acton PT Acute Rehabilitation Services Pager [phone] Office [phone]    [place] 12/17/2023, 3:44 PM

## 2023-12-17 NOTE — Plan of Care (Signed)
  Problem: Activity: Goal: Risk for activity intolerance will decrease Outcome: Progressing   Problem: Nutrition: Goal: Adequate nutrition will be maintained Outcome: Progressing   Problem: Pain Management: Goal: General experience of comfort will improve Outcome: Progressing

## 2023-12-17 NOTE — Plan of Care (Signed)

## 2023-12-17 NOTE — Progress Notes (Signed)
 Physical Therapy Treatment Patient Details Name: Peter Blair MRN: [id number] DOB: 06/28/1941 Today's Date: 12/17/2023   History of Present Illness Pt s/p L TKR and with hx of CAD, DM.  Pt also c/o pre-existing R hip pain aggravated by ABD    PT Comments  Pt continues cooperative but requiring increased time and frequent rest breaks for task completion.  Pt performed therex with assist but requesting rest break prior to attempting ambulation.  Will follow up shortly.    If plan is discharge home, recommend the following: A little help with walking and/or transfers;A little help with bathing/dressing/bathroom;Assistance with cooking/housework;Assist for transportation;Help with stairs or ramp for entrance   Can travel by private vehicle        Equipment Recommendations  Rolling walker (2 wheels)    Recommendations for Other Services       Precautions / Restrictions Precautions Precautions: Knee Required Braces or Orthoses: Knee Immobilizer - Left Knee Immobilizer - Left: Discontinue once straight leg raise with < 10 degree lag Restrictions Weight Bearing Restrictions Per Provider Order: No     Mobility  Bed Mobility                    Transfers                        Ambulation/Gait                   Stairs             Wheelchair Mobility     Tilt Bed    Modified Rankin (Stroke Patients Only)       Balance                                            Cognition Arousal: Alert Behavior During Therapy: WFL for tasks assessed/performed Overall Cognitive Status: Within Functional Limits for tasks assessed                                          Exercises Total Joint Exercises Ankle Circles/Pumps: AROM, Both, 15 reps, Supine Quad Sets: AROM, Both, 10 reps, Supine Heel Slides: AAROM, Left, 15 reps, Supine Straight Leg Raises: AAROM, Left, Supine, 20 reps    General Comments         Pertinent Vitals/Pain Pain Assessment Pain Assessment: 0-10 Pain Score: 6  Pain Location: L knee with activity Pain Descriptors / Indicators: Aching, Sore Pain Intervention(s): Limited activity within patient's tolerance, Monitored during session, Premedicated before session, Ice applied    Home Living                          Prior Function            PT Goals (current goals can now be found in the care plan section) Acute Rehab PT Goals Patient Stated Goal: Regain IND PT Goal Formulation: With patient Time For Goal Achievement: 12/23/23 Potential to Achieve Goals: Good Progress towards PT goals: Progressing toward goals    Frequency    7X/week      PT Plan      Co-evaluation              AM-PAC PT  6 Clicks Mobility   Outcome Measure  Help needed turning from your back to your side while in a flat bed without using bedrails?: A Little Help needed moving from lying on your back to sitting on the side of a flat bed without using bedrails?: A Little Help needed moving to and from a bed to a chair (including a wheelchair)?: A Little Help needed standing up from a chair using your arms (e.g., wheelchair or bedside chair)?: A Little Help needed to walk in hospital room?: A Little Help needed climbing 3-5 steps with a railing? : A Lot 6 Click Score: 17    End of Session   Activity Tolerance: Patient limited by pain Patient left: in bed;with call bell/phone within reach;with bed alarm set Nurse Communication: Mobility status PT Visit Diagnosis: Difficulty in walking, not elsewhere classified (R26.2)     Time: 1042-1100 PT Time Calculation (min) (ACUTE ONLY): 18 min  Charges:    $Therapeutic Exercise: 8-22 mins PT General Charges $$ ACUTE PT VISIT: 1 Visit                     Peter Blair PT Acute Rehabilitation Services Pager [phone] Office [phone]    Peter Blair 12/17/2023, 12:28 PM

## 2023-12-17 NOTE — Progress Notes (Signed)
 Physical Therapy Treatment Patient Details Name: Peter Blair MRN: [id number] DOB: September 09, 1941 Today's Date: 12/17/2023   History of Present Illness Pt s/p L TKR and with hx of CAD, DM.  Pt also c/o pre-existing R hip pain aggravated by ABD    PT Comments  Pt continues cooperative but progress limited by ongoing pain control issues.    If plan is discharge home, recommend the following: A little help with walking and/or transfers;A little help with bathing/dressing/bathroom;Assistance with cooking/housework;Assist for transportation;Help with stairs or ramp for entrance   Can travel by private vehicle        Equipment Recommendations  Rolling walker (2 wheels)    Recommendations for Other Services       Precautions / Restrictions Precautions Precautions: Knee Required Braces or Orthoses: Knee Immobilizer - Left Knee Immobilizer - Left: Discontinue once straight leg raise with < 10 degree lag Restrictions Weight Bearing Restrictions Per Provider Order: No     Mobility  Bed Mobility Overal bed mobility: Needs Assistance Bed Mobility: Supine to Sit     Supine to sit: Min assist     General bed mobility comments: Increased time with cues for sequence    Transfers Overall transfer level: Needs assistance Equipment used: Rolling walker (2 wheels) Transfers: Sit to/from Stand Sit to Stand: Min assist, From elevated surface           General transfer comment: cues for LE management and use of UEs to self assist    Ambulation/Gait Ambulation/Gait assistance: Min assist Gait Distance (Feet): 10 Feet Assistive device: Rolling walker (2 wheels) Gait Pattern/deviations: Step-to pattern, Decreased step length - right, Decreased step length - left, Shuffle, Antalgic, Trunk flexed Gait velocity: decr     General Gait Details: Increased time with cues for squence, posture and position from RW; distance ltd by pain and c/o feeling lightheaded   Stairs              Wheelchair Mobility     Tilt Bed    Modified Rankin (Stroke Patients Only)       Balance Overall balance assessment: Needs assistance Sitting-balance support: No upper extremity supported, Feet supported Sitting balance-Leahy Scale: Good     Standing balance support: Bilateral upper extremity supported Standing balance-Leahy Scale: Poor                              Cognition Arousal: Alert Behavior During Therapy: WFL for tasks assessed/performed Overall Cognitive Status: Within Functional Limits for tasks assessed                                          Exercises Total Joint Exercises Ankle Circles/Pumps: AROM, Both, 15 reps, Supine Quad Sets: AROM, Both, 10 reps, Supine Heel Slides: AAROM, Left, 15 reps, Supine Straight Leg Raises: AAROM, Left, Supine, 20 reps    General Comments        Pertinent Vitals/Pain Pain Assessment Pain Assessment: 0-10 Pain Score: 8  Pain Location: L knee with activity Pain Descriptors / Indicators: Aching, Sore, Guarding, Grimacing Pain Intervention(s): Limited activity within patient's tolerance, Monitored during session, Premedicated before session, Ice applied    Home Living                          Prior Function  PT Goals (current goals can now be found in the care plan section) Acute Rehab PT Goals Patient Stated Goal: Regain IND PT Goal Formulation: With patient Time For Goal Achievement: 12/23/23 Potential to Achieve Goals: Good Progress towards PT goals: Not progressing toward goals - comment (pain limited)    Frequency    7X/week      PT Plan      Co-evaluation              AM-PAC PT 6 Clicks Mobility   Outcome Measure  Help needed turning from your back to your side while in a flat bed without using bedrails?: A Little Help needed moving from lying on your back to sitting on the side of a flat bed without using bedrails?: A Little Help  needed moving to and from a bed to a chair (including a wheelchair)?: A Little Help needed standing up from a chair using your arms (e.g., wheelchair or bedside chair)?: A Little Help needed to walk in hospital room?: A Little Help needed climbing 3-5 steps with a railing? : A Lot 6 Click Score: 17    End of Session Equipment Utilized During Treatment: Gait belt;Left knee immobilizer Activity Tolerance: Patient limited by pain Patient left: in chair;with call bell/phone within reach;with chair alarm set;with family/visitor present Nurse Communication: Mobility status PT Visit Diagnosis: Difficulty in walking, not elsewhere classified (R26.2)     Time: 1131-1150 PT Time Calculation (min) (ACUTE ONLY): 19 min  Charges:    $Gait Training: 8-22 mins $Therapeutic Exercise: 8-22 mins PT General Charges $$ ACUTE PT VISIT: 1 Visit                     Peter Blair PT Acute Rehabilitation Services Pager [phone] Office [phone]    Peter Blair 12/17/2023, 12:33 PM

## 2023-12-18 ENCOUNTER — Encounter (HOSPITAL_COMMUNITY): Payer: Self-pay | Admitting: Orthopaedic Surgery

## 2023-12-18 LAB — GLUCOSE, CAPILLARY
Glucose-Capillary: 174 mg/dL — ABNORMAL HIGH (ref 70–99)
Glucose-Capillary: 205 mg/dL — ABNORMAL HIGH (ref 70–99)

## 2023-12-18 MED ORDER — LISINOPRIL 20 MG PO TABS
40.0000 mg | ORAL_TABLET | Freq: Every day | ORAL | Status: DC
  Administered 2023-12-18: 40 mg via ORAL
  Filled 2023-12-18: qty 2

## 2023-12-18 NOTE — Progress Notes (Signed)
 Patient ID: Peter Blair, male   DOB: [birth year], 83 y.o.   MRN: [id number] The patient is still having difficulty mobilizing.  His wife is at the bedside this morning.  He is currently getting up and going to try to have a bowel movement.  His wife did let me know that he is backed off a little on pain medications which is a good thing.  We talked about the possibility of looking into short-term skilled nursing but they would prefer to be able to go home.  We will see how today's therapy session goes and then we will go from there in terms of further discharge planning and disposition.  Hopefully he will be able to go home in the next 1 to 2 days as he is improving.

## 2023-12-18 NOTE — Anesthesia Postprocedure Evaluation (Signed)
 Anesthesia Post Note  Patient: FLORENCIO HOLLIBAUGH  Procedure(s) Performed: LEFT TOTAL KNEE ARTHROPLASTY (Left: Knee) CYSTOSCOPY FLEXIBLE; URETHERAL DILATION; INSERTION OF FOLEY CATHETER (Urethra)     Patient location during evaluation: PACU Anesthesia Type: Spinal Level of consciousness: awake, awake and alert and oriented Pain management: pain level controlled Vital Signs Assessment: post-procedure vital signs reviewed and stable Respiratory status: spontaneous breathing, nonlabored ventilation and respiratory function stable Cardiovascular status: blood pressure returned to baseline and stable Postop Assessment: no headache, no backache, spinal receding and no apparent nausea or vomiting Anesthetic complications: no   No notable events documented.  Last Vitals:  Vitals:   12/17/23 2118 12/18/23 0605  BP: (!) 156/64 (!) 172/71  Pulse: 93 91  Resp: 17 16  Temp: 37.4 C 36.6 C  SpO2: 94% 95%    Last Pain:  Vitals:   12/18/23 0605  TempSrc: Oral  PainSc:    Pain Goal: Patients Stated Pain Goal: 3 (12/17/23 0919)                 Garnette FORBES Skillern

## 2023-12-18 NOTE — Discharge Summary (Signed)
 Patient ID: Peter Blair MRN: [id number] DOB/AGE: 1941-10-13 83 y.o.  Admit date: 12/15/2023 Discharge date: 12/18/2023  Admission Diagnoses:  Principal Problem:   Status post total left knee replacement Active Problems:   Unilateral primary osteoarthritis, left knee   Discharge Diagnoses:  Same  Past Medical History:  Diagnosis Date   Coronary artery disease    exertional chest pain, cath 12/10 showed EF 50-55% with mild inferior hypokensis, 99% proximal and 60% distal RCA stenosis, 80% prox stenosis of a moderate sized D1, 40% prox LAD. Patient had BMS x2 placed in RCA. Unstable Angina. PROMUS DES to the prox RCA   Diabetes mellitus without complication (HCC)    Erectile dysfunction    GERD (gastroesophageal reflux disease)    Hyperlipidemia    Hypertension    Nephrolithiasis    Dr. Ike   Renal cyst    Right shoulder pain    rotator cuff   Skin cancer    Skin cancer, PMH of, cell type unknown, Dr. Shona    Surgeries: Procedure(s): LEFT TOTAL KNEE ARTHROPLASTY CYSTOSCOPY FLEXIBLE; URETHERAL DILATION; INSERTION OF FOLEY CATHETER on 12/15/2023   Consultants:   Discharged Condition: Improved  Hospital Course: Peter Blair is an 83 y.o. male who was admitted 12/15/2023 for operative treatment ofStatus post total left knee replacement. Patient has severe unremitting pain that affects sleep, daily activities, and work/hobbies. After pre-op clearance the patient was taken to the operating room on 12/15/2023 and underwent  Procedure(s): LEFT TOTAL KNEE ARTHROPLASTY CYSTOSCOPY FLEXIBLE; URETHERAL DILATION; INSERTION OF FOLEY CATHETER.    Patient was given perioperative antibiotics:  Anti-infectives (From admission, onward)    Start     Dose/Rate Route Frequency Ordered Stop   12/15/23 1700  ceFAZolin  (ANCEF ) IVPB 2g/100 mL premix        2 g 200 mL/hr over 30 Minutes Intravenous Every 6 hours 12/15/23 1605 12/15/23 2341   12/15/23 0845  ceFAZolin  (ANCEF ) IVPB 2g/100 mL  premix        2 g 200 mL/hr over 30 Minutes Intravenous On call to O.R. 12/15/23 9157 12/15/23 1118        Patient was given sequential compression devices, early ambulation, and chemoprophylaxis to prevent DVT.  An intraoperative consult was obtained with Dr. Cam from [facility] due to the fact that a Foley could be placed due to a significant stricture.  Dr. Cam was fortunately able to perform a cystoscopy and place a Foley catheter that stayed in throughout the patient's hospitalization.  The patient is going home with a Foley as well with close follow-up with Dr. Cam and [facility] later this week.  Patient benefited maximally from hospital stay and there were no complications.    Recent vital signs: Patient Vitals for the past 24 hrs:  BP Temp Temp src Pulse Resp SpO2  12/18/23 0605 (!) 172/71 97.8 F (36.6 C) Oral 91 16 95 %  12/17/23 2118 (!) 156/64 99.3 F (37.4 C) Oral 93 17 94 %  12/17/23 1319 116/64 98.3 F (36.8 C) Oral 85 16 94 %     Recent laboratory studies:  Recent Labs    12/16/23 0416  WBC 9.7  HGB 12.4*  HCT 34.8*  PLT 143*  NA 134*  K 4.4  CL 105  CO2 21*  BUN 20  CREATININE 0.99  GLUCOSE 253*  CALCIUM  9.1     Discharge Medications:   Allergies as of 12/18/2023       Reactions   Iodinated Contrast  Media    Other reaction(s): NAUSEA   Iohexol     Itchy eyes, dry mouth in early 1990s during imaging for renal calculi        Medication List     STOP taking these medications    aspirin  81 MG tablet Replaced by: aspirin  81 MG chewable tablet       TAKE these medications    Accu-Chek FastClix Lancets Misc 1 each by In Vitro route daily.   Accu-Chek Guide test strip Generic drug: glucose blood USE 1 STRIP ONCE DAILY   amLODipine  10 MG tablet Commonly known as: NORVASC  TAKE 1 TABLET BY MOUTH EVERY DAY   aspirin  81 MG chewable tablet Chew 1 tablet (81 mg total) by mouth 2 (two) times daily. Replaces: aspirin   81 MG tablet   chlorhexidine  4 % external liquid Commonly known as: HIBICLENS  Apply 15 mLs (1 Application total) topically as directed for 30 doses. Use as directed daily for 5 days every other week for 6 weeks.   Coenzyme Q10 200 MG Tabs Take 1 tablet by mouth daily.   gabapentin  600 MG tablet Commonly known as: NEURONTIN  Take 1 tablet (600 mg total) by mouth at bedtime. Take one half tablet at bedtime   lisinopril  40 MG tablet Commonly known as: ZESTRIL  TAKE 1 TABLET BY MOUTH EVERY DAY   metFORMIN  500 MG tablet Commonly known as: GLUCOPHAGE  TAKE 1 TABLET BY MOUTH TWICE A DAY   methocarbamol  500 MG tablet Commonly known as: ROBAXIN  Take 1 tablet (500 mg total) by mouth every 6 (six) hours as needed.   metoprolol  tartrate 25 MG tablet Commonly known as: LOPRESSOR  TAKE 1 TABLET BY MOUTH TWICE A DAY   mupirocin  ointment 2 % Commonly known as: BACTROBAN  Place 1 Application into the nose 2 (two) times daily for 60 doses. Use as directed 2 times daily for 5 days every other week for 6 weeks.   omeprazole 20 MG capsule Commonly known as: PRILOSEC Take 20 mg by mouth daily as needed.   oxyCODONE  5 MG immediate release tablet Commonly known as: Oxy IR/ROXICODONE  Take 1-2 tablets (5-10 mg total) by mouth every 6 (six) hours as needed for moderate pain (pain score 4-6) (pain score 4-6).   rosuvastatin  10 MG tablet Commonly known as: CRESTOR  TAKE 1 TABLET BY MOUTH EVERYDAY AT BEDTIME   sildenafil  20 MG tablet Commonly known as: REVATIO  TAKE 3 TO 5 TABLETS BY MOUTH DAILY AS DIRECTED   tamsulosin  0.4 MG Caps capsule Commonly known as: FLOMAX  Take 1 capsule (0.4 mg total) by mouth daily.               Durable Medical Equipment  (From admission, onward)           Start     Ordered   12/15/23 1606  DME 3 n 1  Once        12/15/23 1605   12/15/23 1606  DME Walker rolling  Once       Question Answer Comment  Walker: With 5 Inch Wheels   Patient needs a walker  to treat with the following condition Status post total left knee replacement      12/15/23 1605            Diagnostic Studies: DG Knee Left Port Result Date: 12/15/2023 CLINICAL DATA:  Status post left knee arthroplasty. EXAM: PORTABLE LEFT KNEE - 1-2 VIEW COMPARISON:  Left knee radiographs 09/25/2023 FINDINGS: There is diffuse decreased bone mineralization. Interval total left knee arthroplasty.  No perihardware lucency is seen to indicate hardware failure or loosening. Expected postoperative changes including intra-articular and subcutaneous air. Smalljoint effusion. Anterior surgical skin staples. No acute fracture or dislocation. IMPRESSION: Interval total left knee arthroplasty without evidence of hardware failure. Electronically Signed   By: Tanda Lyons M.D.   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   Disposition: Discharge disposition: 01-Home or Self Care          Follow-up Information     [facility]. Schedule an appointment as soon as possible for a visit in 1 week(s).   Why: Catheter removal Contact information: [address] [place]  [address] [phone]        Vernetta Lonni GRADE, MD Follow up in 2 week(s).   Specialty: Orthopedic Surgery Contact information: [address] [place]  [place] [address] [phone]                  Signed: Lonni GRADE Vernetta 12/18/2023, 11:54 AM

## 2023-12-18 NOTE — Progress Notes (Signed)
 Physical Therapy Treatment Patient Details Name: Peter Blair MRN: [id number] DOB: 28-May-1941 Today's Date: 12/18/2023   History of Present Illness Pt s/p L TKR and with hx of CAD, DM.  Pt also c/o pre-existing R hip pain aggravated by ABD    PT Comments  POD # 3 pm session Had Spouse don KI then assist Pt OOB to amb in hallway as well as repeat stair training.  Re educated importance of KI for increased knee support for stairs.  Then returned to room to review TE's following HEP handout.  Instructed on proper tech, freq as well as use of ICE.  Pt plans to have HH PT.  Walker was delivered and is in room. Addressed all mobility questions, discussed appropriate activity, educated on use of ICE.  Pt ready for D/C to home.     If plan is discharge home, recommend the following: A little help with walking and/or transfers;A little help with bathing/dressing/bathroom;Assistance with cooking/housework;Assist for transportation;Help with stairs or ramp for entrance   Can travel by private vehicle        Equipment Recommendations  Rolling walker (2 wheels)    Recommendations for Other Services       Precautions / Restrictions Precautions Precautions: Knee Precaution Comments: no pillow under knee Required Braces or Orthoses: Knee Immobilizer - Left Knee Immobilizer - Left:  (stairs) Restrictions Weight Bearing Restrictions Per Provider Order: No Other Position/Activity Restrictions: WBAT     Mobility  Bed Mobility Overal bed mobility: Needs Assistance Bed Mobility: Supine to Sit, Sit to Supine     Supine to sit: Min assist Sit to supine: Min assist   General bed mobility comments: demonstarted and instructed how to use a belt to self assist    Transfers Overall transfer level: Needs assistance Equipment used: Rolling walker (2 wheels) Transfers: Sit to/from Stand Sit to Stand: Min assist, Contact guard assist           General transfer comment: cues for LE  management and use of UEs to self assist    Ambulation/Gait Ambulation/Gait assistance: Min assist, Contact guard assist Gait Distance (Feet): 18 Feet Assistive device: Rolling walker (2 wheels) Gait Pattern/deviations: Step-to pattern, Decreased step length - right, Decreased step length - left, Shuffle, Antalgic, Trunk flexed Gait velocity: decr     General Gait Details: Increased time with cues for squence, posture and position from RW; had Spouse hands on assist using safety belt.   Stairs Stairs: Yes Stairs assistance: Min assist, Contact guard assist Stair Management: One rail Right, Forwards, Step to pattern Number of Stairs: 2 General stair comments: with Spouse hands on assisted using safety belt up 2 steps using one RIGHT rail and down using walker while Spouse assisted securing.  Performed with decreased instruction from Therapist as Pt and Spouse able to recall from this morning.   Wheelchair Mobility     Tilt Bed    Modified Rankin (Stroke Patients Only)       Balance                                            Cognition   Behavior During Therapy: WFL for tasks assessed/performed Overall Cognitive Status: Within Functional Limits for tasks assessed  General Comments: AxO x 3 pleasant.  Supportive Spouse at bedside.        Exercises      General Comments        Pertinent Vitals/Pain Pain Assessment Pain Assessment: 0-10 Pain Score: 5  Pain Location: L knee with activity Pain Descriptors / Indicators: Aching, Sore, Guarding, Grimacing Pain Intervention(s): Monitored during session, Premedicated before session, Repositioned, Patient requesting pain meds-RN notified    Home Living                          Prior Function            PT Goals (current goals can now be found in the care plan section) Progress towards PT goals: Progressing toward goals     Frequency    7X/week      PT Plan      Co-evaluation              AM-PAC PT 6 Clicks Mobility   Outcome Measure  Help needed turning from your back to your side while in a flat bed without using bedrails?: A Little Help needed moving from lying on your back to sitting on the side of a flat bed without using bedrails?: A Little Help needed moving to and from a bed to a chair (including a wheelchair)?: A Little Help needed standing up from a chair using your arms (e.g., wheelchair or bedside chair)?: A Little Help needed to walk in hospital room?: A Little Help needed climbing 3-5 steps with a railing? : A Lot 6 Click Score: 17    End of Session Equipment Utilized During Treatment: Gait belt;Left knee immobilizer (for stairs) Activity Tolerance: Patient limited by fatigue Patient left: in bed;with call bell/phone within reach;with bed alarm set Nurse Communication: Mobility status PT Visit Diagnosis: Difficulty in walking, not elsewhere classified (R26.2)     Time: 1410-1435 PT Time Calculation (min) (ACUTE ONLY): 25 min  Charges:    $Gait Training: 8-22 mins $Therapeutic Activity: 8-22 mins PT General Charges $$ ACUTE PT VISIT: 1 Visit                    Katheryn Leap  PTA Acute  Rehabilitation Services Office M-F          [phone]

## 2023-12-18 NOTE — Progress Notes (Signed)
 Patient ID: Peter Blair, male   DOB: 1941-06-29, 83 y.o.   MRN: [id number] The patient did well with physical therapy this morning and accomplished stair training.  His wife is at the bedside.  They do feel comfortable with being discharged home this afternoon.  They know that they need to call Dr. Cam with [facility] for follow-up appointment later this week.  He will be going home with his Foley catheter.

## 2023-12-18 NOTE — Progress Notes (Signed)
 Physical Therapy Treatment Patient Details Name: Peter Blair MRN: [id number] DOB: [birth year] Today's Date: 12/18/2023   History of Present Illness Pt s/p L TKR and with hx of CAD, DM.  Pt also c/o pre-existing R hip pain aggravated by ABD    PT Comments  POD # 3 am session General Comments: AxO x 3 pleasant.  Supportive Spouse at bedside. Pt still unable to perform a SLR has been using KI for amb.  This session (day 3) attempted without but did use for stairs.  Educated/instructed Spouse proper donning/doffing/use for stairs.  Assisted OOB while instructing use of belt to self guide L LE.  Had Spouse hands on assist with transfers and gait using safety belt.  General stair comments: with Spouse hands on assisted using safety belt up 2 steps using one RIGHT rail and down using walker while Spouse assisted securing.  Performed twice. Assisted back to bed to rest and applied ICE.  Educated/Instructed Spouse on proper tech/use/application of ICE Man Machine. Pt will need another PT session to repeat stair training and Educate on HEP. Pt is axpected to D/C to home after second PT session.    If plan is discharge home, recommend the following: A little help with walking and/or transfers;A little help with bathing/dressing/bathroom;Assistance with cooking/housework;Assist for transportation;Help with stairs or ramp for entrance   Can travel by private vehicle        Equipment Recommendations  Rolling walker (2 wheels)    Recommendations for Other Services       Precautions / Restrictions Precautions Precautions: Knee Precaution Comments: no pillow under knee Required Braces or Orthoses: Knee Immobilizer - Left Knee Immobilizer - Left: Other (comment) (stairs) Restrictions Weight Bearing Restrictions Per Provider Order: No Other Position/Activity Restrictions: WBAT     Mobility  Bed Mobility Overal bed mobility: Needs Assistance Bed Mobility: Supine to Sit, Sit to Supine      Supine to sit: Min assist Sit to supine: Min assist   General bed mobility comments: demonstarted and instructed how to use a belt to self assist    Transfers Overall transfer level: Needs assistance Equipment used: Rolling walker (2 wheels) Transfers: Sit to/from Stand Sit to Stand: Min assist, Contact guard assist           General transfer comment: cues for LE management and use of UEs to self assist    Ambulation/Gait Ambulation/Gait assistance: Min assist, Contact guard assist Gait Distance (Feet): 25 Feet Assistive device: Rolling walker (2 wheels) Gait Pattern/deviations: Step-to pattern, Decreased step length - right, Decreased step length - left, Shuffle, Antalgic, Trunk flexed Gait velocity: decr     General Gait Details: Increased time with cues for squence, posture and position from RW; had Spouse hands on assist using safety belt.   Stairs Stairs: Yes Stairs assistance: Min assist, Contact guard assist Stair Management: One rail Right, Forwards, Step to pattern Number of Stairs: 2 General stair comments: with Spouse hands on assisted using safety belt up 2 steps using one RIGHT rail and down using walker while Spouse assisted securing.  Performed twice.   Wheelchair Mobility     Tilt Bed    Modified Rankin (Stroke Patients Only)       Balance                                            Cognition  Behavior During Therapy: WFL for tasks assessed/performed Overall Cognitive Status: Within Functional Limits for tasks assessed                                 General Comments: AxO x 3 pleasant.  Supportive Spouse at bedside.        Exercises      General Comments        Pertinent Vitals/Pain Pain Assessment Pain Assessment: 0-10 Pain Score: 5  Pain Location: L knee with activity Pain Descriptors / Indicators: Aching, Sore, Guarding, Grimacing Pain Intervention(s): Monitored during session,  Premedicated before session, Repositioned, Patient requesting pain meds-RN notified    Home Living                          Prior Function            PT Goals (current goals can now be found in the care plan section) Progress towards PT goals: Progressing toward goals    Frequency    7X/week      PT Plan      Co-evaluation              AM-PAC PT 6 Clicks Mobility   Outcome Measure  Help needed turning from your back to your side while in a flat bed without using bedrails?: A Little Help needed moving from lying on your back to sitting on the side of a flat bed without using bedrails?: A Little Help needed moving to and from a bed to a chair (including a wheelchair)?: A Little Help needed standing up from a chair using your arms (e.g., wheelchair or bedside chair)?: A Little Help needed to walk in hospital room?: A Little Help needed climbing 3-5 steps with a railing? : A Lot 6 Click Score: 17    End of Session Equipment Utilized During Treatment: Gait belt;Left knee immobilizer (for stairs) Activity Tolerance: Patient limited by fatigue Patient left: in bed;with call bell/phone within reach;with bed alarm set Nurse Communication: Mobility status PT Visit Diagnosis: Difficulty in walking, not elsewhere classified (R26.2)     Time: 8947-8879 PT Time Calculation (min) (ACUTE ONLY): 28 min  Charges:    $Gait Training: 8-22 mins $Therapeutic Activity: 8-22 mins PT General Charges $$ ACUTE PT VISIT: 1 Visit                     Katheryn Leap  PTA Acute  Rehabilitation Services Office M-F          [phone]

## 2023-12-19 ENCOUNTER — Telehealth: Payer: Self-pay

## 2023-12-19 NOTE — Transitions of Care (Post Inpatient/ED Visit) (Signed)
 12/19/2023  Name: Peter Blair MRN: [id number] DOB: Apr 10, 1941  Today's TOC FU Call Status: Today's TOC FU Call Status:: Successful TOC FU Call Completed TOC FU Call Complete Date: 12/19/23 Patient's Name and Date of Birth confirmed.  Transition Care Management Follow-up Telephone Call Date of Discharge: 12/19/23 Discharge Facility: [facility]) Type of Discharge: Inpatient Admission Primary Inpatient Discharge Diagnosis:: Right Total knee replacement How have you been since you were released from the hospital?: Same Any questions or concerns?: Yes Patient Questions/Concerns:: Questions about the foley Patient Questions/Concerns Addressed: Other: (RNCM was able to answer questions)  Items Reviewed: Did you receive and understand the discharge instructions provided?: Yes Medications obtained,verified, and reconciled?: Yes (Medications Reviewed) Any new allergies since your discharge?: No Dietary orders reviewed?: Yes Type of Diet Ordered:: Heart Healthy Do you have support at home?: Yes People in Home: spouse Name of Support/Comfort Primary Source: Peter Blair, spouse  Medications Reviewed Today: Medications Reviewed Today     Reviewed by Moises Reusing, RN (Case Manager) on 12/19/23 at 1520  Med List Status: <None>   Medication Order Taking? Sig Documenting Provider Last Dose Status Informant  Accu-Chek FastClix Lancets MISC [id number] No 1 each by In Vitro route daily. Jimmy Charlie FERNS, MD Taking Active   amLODipine  (NORVASC ) 10 MG tablet [id number] No TAKE 1 TABLET BY MOUTH EVERY DAY Jimmy Charlie FERNS, MD 12/15/2023  7:15 AM Active   aspirin  81 MG chewable tablet [id number]  Chew 1 tablet (81 mg total) by mouth 2 (two) times daily. Vernetta Lonni GRADE, MD  Active   chlorhexidine  (HIBICLENS ) 4 % external liquid [id number]  Apply 15 mLs (1 Application total) topically as directed for 30 doses. Use as directed daily for 5 days every other week for 6 weeks. Vernetta Lonni GRADE, MD  Active   Coenzyme Q10 200 MG TABS [id number] No Take 1 tablet by mouth daily.  Rolan Ezra GORMAN, MD 12/14/2023 Active   gabapentin  (NEURONTIN ) 600 MG tablet [id number] No Take 1 tablet (600 mg total) by mouth at bedtime. Take one half tablet at bedtime Vernetta Lonni GRADE, MD Past Week Active            Med Note (GALARZA RODRIGUEZ, SUSAN A   Thu Dec 07, 2023  2:23 PM) 0.5 tab at bedtime  glucose blood (ACCU-CHEK GUIDE) test strip [id number] No USE 1 STRIP ONCE DAILY Jimmy Charlie FERNS, MD Taking Active   lisinopril  (ZESTRIL ) 40 MG tablet [id number] No TAKE 1 TABLET BY MOUTH EVERY DAY Jimmy Charlie FERNS, MD 12/14/2023 Active   metFORMIN  (GLUCOPHAGE ) 500 MG tablet [id number] No TAKE 1 TABLET BY MOUTH TWICE A DAY Jimmy Charlie FERNS, MD 12/14/2023 Active   methocarbamol  (ROBAXIN ) 500 MG tablet [id number] No Take 1 tablet (500 mg total) by mouth every 6 (six) hours as needed. Vernetta Lonni GRADE, MD Past Week Active   metoprolol  tartrate (LOPRESSOR ) 25 MG tablet [id number] No TAKE 1 TABLET BY MOUTH TWICE A DAY Jimmy Charlie FERNS, MD 12/15/2023  7:15 AM Active   mupirocin  ointment (BACTROBAN ) 2 % [id number]  Place 1 Application into the nose 2 (two) times daily for 60 doses. Use as directed 2 times daily for 5 days every other week for 6 weeks. Vernetta Lonni GRADE, MD  Active   omeprazole (PRILOSEC) 20 MG capsule [id number] No Take 20 mg by mouth daily as needed. [provider] 12/15/2023  7:15 AM Active   oxyCODONE  (OXY IR/ROXICODONE ) 5 MG immediate release tablet [id number]  Take 1-2 tablets (  5-10 mg total) by mouth every 6 (six) hours as needed for moderate pain (pain score 4-6) (pain score 4-6). Vernetta Lonni GRADE, MD  Active   rosuvastatin  (CRESTOR ) 10 MG tablet [id number] No TAKE 1 TABLET BY MOUTH EVERYDAY AT BEDTIME Jimmy Charlie FERNS, MD 12/14/2023 Active   sildenafil  (REVATIO ) 20 MG tablet [id number] No TAKE 3 TO 5 TABLETS BY MOUTH DAILY AS DIRECTED  Patient taking differently:  Take 3-5 tablets by mouth as needed (ED).   Jimmy Charlie FERNS, MD More than a month Active   tamsulosin  (FLOMAX ) 0.4 MG CAPS capsule [id number] No Take 1 capsule (0.4 mg total) by mouth daily. Jimmy Charlie FERNS, MD Past Month Active             Home Care and Equipment/Supplies: Were Home Health Services Ordered?: Yes Name of Home Health Agency:: [facility] Has Agency set up a time to come to your home?: No EMR reviewed for Home Health Orders: Orders present/patient has not received call (refer to CM for follow-up) Any new equipment or medical supplies ordered?: Yes Name of Medical supply agency?: [employer] Were you able to get the equipment/medical supplies?: Yes Do you have any questions related to the use of the equipment/supplies?: No  Functional Questionnaire: Do you need assistance with bathing/showering or dressing?: No Do you need assistance with meal preparation?: No Do you need assistance with eating?: No Do you have difficulty maintaining continence: No Do you need assistance with getting out of bed/getting out of a chair/moving?: No Do you have difficulty managing or taking your medications?: No  Follow up appointments reviewed: PCP Follow-up appointment confirmed?: NA Specialist Hospital Follow-up appointment confirmed?: Yes Date of Specialist follow-up appointment?: 12/28/23 Follow-Up Specialty Provider:: Dr. Vernetta Do you need transportation to your follow-up appointment?: No Do you understand care options if your condition(s) worsen?: Yes-patient verbalized understanding  SDOH Interventions Today    Flowsheet Row Most Recent Value  SDOH Interventions   Food Insecurity Interventions Intervention Not Indicated  Housing Interventions Intervention Not Indicated  Transportation Interventions Intervention Not Indicated  Utilities Interventions Intervention Not Indicated  Social Connections Interventions Intervention Not Indicated      TOC Outreach  completed today. The patient had a total knee replacement and had a problem during surgery when trying to insert the foley catheter. 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 He states he is very dry around the insertion site and he tried KY jelly but it dried out as well. Recommended water  based lubricant. Reviewed extensively the discharge instructions which he is compliant with. He is having some pain at his surgery site as well as his hip. HHPT has not started yet and recommended pain medications administration prior to therapy. He declined to have a PCP follow up. He is following up with Urology on 12/22/23 and then his post op appointment on 12/28/23 with Dr. Vernetta. He declined the 30 day Outreach.    Medford Balboa, RN Medical Illustrator [facility] [phone]

## 2023-12-20 DIAGNOSIS — Z9842 Cataract extraction status, left eye: Secondary | ICD-10-CM | POA: Diagnosis not present

## 2023-12-20 DIAGNOSIS — L570 Actinic keratosis: Secondary | ICD-10-CM | POA: Diagnosis not present

## 2023-12-20 DIAGNOSIS — Z79891 Long term (current) use of opiate analgesic: Secondary | ICD-10-CM | POA: Diagnosis not present

## 2023-12-20 DIAGNOSIS — Z471 Aftercare following joint replacement surgery: Secondary | ICD-10-CM | POA: Diagnosis not present

## 2023-12-20 DIAGNOSIS — Z85828 Personal history of other malignant neoplasm of skin: Secondary | ICD-10-CM | POA: Diagnosis not present

## 2023-12-20 DIAGNOSIS — Z96652 Presence of left artificial knee joint: Secondary | ICD-10-CM | POA: Diagnosis not present

## 2023-12-20 DIAGNOSIS — Z7984 Long term (current) use of oral hypoglycemic drugs: Secondary | ICD-10-CM | POA: Diagnosis not present

## 2023-12-20 DIAGNOSIS — K635 Polyp of colon: Secondary | ICD-10-CM | POA: Diagnosis not present

## 2023-12-20 DIAGNOSIS — Z955 Presence of coronary angioplasty implant and graft: Secondary | ICD-10-CM | POA: Diagnosis not present

## 2023-12-20 DIAGNOSIS — R338 Other retention of urine: Secondary | ICD-10-CM | POA: Diagnosis not present

## 2023-12-20 DIAGNOSIS — I2510 Atherosclerotic heart disease of native coronary artery without angina pectoris: Secondary | ICD-10-CM | POA: Diagnosis not present

## 2023-12-20 DIAGNOSIS — N281 Cyst of kidney, acquired: Secondary | ICD-10-CM | POA: Diagnosis not present

## 2023-12-20 DIAGNOSIS — K5900 Constipation, unspecified: Secondary | ICD-10-CM | POA: Diagnosis not present

## 2023-12-20 DIAGNOSIS — M75101 Unspecified rotator cuff tear or rupture of right shoulder, not specified as traumatic: Secondary | ICD-10-CM | POA: Diagnosis not present

## 2023-12-20 DIAGNOSIS — E785 Hyperlipidemia, unspecified: Secondary | ICD-10-CM | POA: Diagnosis not present

## 2023-12-20 DIAGNOSIS — Z7982 Long term (current) use of aspirin: Secondary | ICD-10-CM | POA: Diagnosis not present

## 2023-12-20 DIAGNOSIS — K219 Gastro-esophageal reflux disease without esophagitis: Secondary | ICD-10-CM | POA: Diagnosis not present

## 2023-12-20 DIAGNOSIS — E119 Type 2 diabetes mellitus without complications: Secondary | ICD-10-CM | POA: Diagnosis not present

## 2023-12-20 DIAGNOSIS — I10 Essential (primary) hypertension: Secondary | ICD-10-CM | POA: Diagnosis not present

## 2023-12-20 DIAGNOSIS — M159 Polyosteoarthritis, unspecified: Secondary | ICD-10-CM | POA: Diagnosis not present

## 2023-12-20 DIAGNOSIS — Z466 Encounter for fitting and adjustment of urinary device: Secondary | ICD-10-CM | POA: Diagnosis not present

## 2023-12-20 DIAGNOSIS — N200 Calculus of kidney: Secondary | ICD-10-CM | POA: Diagnosis not present

## 2023-12-20 DIAGNOSIS — Z9841 Cataract extraction status, right eye: Secondary | ICD-10-CM | POA: Diagnosis not present

## 2023-12-22 DIAGNOSIS — Z466 Encounter for fitting and adjustment of urinary device: Secondary | ICD-10-CM | POA: Diagnosis not present

## 2023-12-22 DIAGNOSIS — Z9842 Cataract extraction status, left eye: Secondary | ICD-10-CM | POA: Diagnosis not present

## 2023-12-22 DIAGNOSIS — R338 Other retention of urine: Secondary | ICD-10-CM | POA: Diagnosis not present

## 2023-12-22 DIAGNOSIS — I10 Essential (primary) hypertension: Secondary | ICD-10-CM | POA: Diagnosis not present

## 2023-12-22 DIAGNOSIS — Z96652 Presence of left artificial knee joint: Secondary | ICD-10-CM | POA: Diagnosis not present

## 2023-12-22 DIAGNOSIS — I2510 Atherosclerotic heart disease of native coronary artery without angina pectoris: Secondary | ICD-10-CM | POA: Diagnosis not present

## 2023-12-22 DIAGNOSIS — K5900 Constipation, unspecified: Secondary | ICD-10-CM | POA: Diagnosis not present

## 2023-12-22 DIAGNOSIS — Z7982 Long term (current) use of aspirin: Secondary | ICD-10-CM | POA: Diagnosis not present

## 2023-12-22 DIAGNOSIS — Z9841 Cataract extraction status, right eye: Secondary | ICD-10-CM | POA: Diagnosis not present

## 2023-12-22 DIAGNOSIS — E119 Type 2 diabetes mellitus without complications: Secondary | ICD-10-CM | POA: Diagnosis not present

## 2023-12-22 DIAGNOSIS — N281 Cyst of kidney, acquired: Secondary | ICD-10-CM | POA: Diagnosis not present

## 2023-12-22 DIAGNOSIS — E785 Hyperlipidemia, unspecified: Secondary | ICD-10-CM | POA: Diagnosis not present

## 2023-12-22 DIAGNOSIS — M159 Polyosteoarthritis, unspecified: Secondary | ICD-10-CM | POA: Diagnosis not present

## 2023-12-22 DIAGNOSIS — K635 Polyp of colon: Secondary | ICD-10-CM | POA: Diagnosis not present

## 2023-12-22 DIAGNOSIS — Z955 Presence of coronary angioplasty implant and graft: Secondary | ICD-10-CM | POA: Diagnosis not present

## 2023-12-22 DIAGNOSIS — Z471 Aftercare following joint replacement surgery: Secondary | ICD-10-CM | POA: Diagnosis not present

## 2023-12-22 DIAGNOSIS — Z79891 Long term (current) use of opiate analgesic: Secondary | ICD-10-CM | POA: Diagnosis not present

## 2023-12-22 DIAGNOSIS — K219 Gastro-esophageal reflux disease without esophagitis: Secondary | ICD-10-CM | POA: Diagnosis not present

## 2023-12-22 DIAGNOSIS — Z85828 Personal history of other malignant neoplasm of skin: Secondary | ICD-10-CM | POA: Diagnosis not present

## 2023-12-22 DIAGNOSIS — M75101 Unspecified rotator cuff tear or rupture of right shoulder, not specified as traumatic: Secondary | ICD-10-CM | POA: Diagnosis not present

## 2023-12-22 DIAGNOSIS — N35013 Post-traumatic anterior urethral stricture: Secondary | ICD-10-CM | POA: Diagnosis not present

## 2023-12-22 DIAGNOSIS — L570 Actinic keratosis: Secondary | ICD-10-CM | POA: Diagnosis not present

## 2023-12-22 DIAGNOSIS — N200 Calculus of kidney: Secondary | ICD-10-CM | POA: Diagnosis not present

## 2023-12-22 DIAGNOSIS — Z7984 Long term (current) use of oral hypoglycemic drugs: Secondary | ICD-10-CM | POA: Diagnosis not present

## 2023-12-23 DIAGNOSIS — Z7984 Long term (current) use of oral hypoglycemic drugs: Secondary | ICD-10-CM | POA: Diagnosis not present

## 2023-12-23 DIAGNOSIS — E119 Type 2 diabetes mellitus without complications: Secondary | ICD-10-CM | POA: Diagnosis not present

## 2023-12-23 DIAGNOSIS — Z955 Presence of coronary angioplasty implant and graft: Secondary | ICD-10-CM | POA: Diagnosis not present

## 2023-12-23 DIAGNOSIS — I2510 Atherosclerotic heart disease of native coronary artery without angina pectoris: Secondary | ICD-10-CM | POA: Diagnosis not present

## 2023-12-23 DIAGNOSIS — E785 Hyperlipidemia, unspecified: Secondary | ICD-10-CM | POA: Diagnosis not present

## 2023-12-23 DIAGNOSIS — M75101 Unspecified rotator cuff tear or rupture of right shoulder, not specified as traumatic: Secondary | ICD-10-CM | POA: Diagnosis not present

## 2023-12-23 DIAGNOSIS — I10 Essential (primary) hypertension: Secondary | ICD-10-CM | POA: Diagnosis not present

## 2023-12-23 DIAGNOSIS — L570 Actinic keratosis: Secondary | ICD-10-CM | POA: Diagnosis not present

## 2023-12-23 DIAGNOSIS — Z7982 Long term (current) use of aspirin: Secondary | ICD-10-CM | POA: Diagnosis not present

## 2023-12-23 DIAGNOSIS — R338 Other retention of urine: Secondary | ICD-10-CM | POA: Diagnosis not present

## 2023-12-23 DIAGNOSIS — N200 Calculus of kidney: Secondary | ICD-10-CM | POA: Diagnosis not present

## 2023-12-23 DIAGNOSIS — Z96652 Presence of left artificial knee joint: Secondary | ICD-10-CM | POA: Diagnosis not present

## 2023-12-23 DIAGNOSIS — Z471 Aftercare following joint replacement surgery: Secondary | ICD-10-CM | POA: Diagnosis not present

## 2023-12-23 DIAGNOSIS — N281 Cyst of kidney, acquired: Secondary | ICD-10-CM | POA: Diagnosis not present

## 2023-12-23 DIAGNOSIS — Z79891 Long term (current) use of opiate analgesic: Secondary | ICD-10-CM | POA: Diagnosis not present

## 2023-12-23 DIAGNOSIS — M159 Polyosteoarthritis, unspecified: Secondary | ICD-10-CM | POA: Diagnosis not present

## 2023-12-23 DIAGNOSIS — Z9841 Cataract extraction status, right eye: Secondary | ICD-10-CM | POA: Diagnosis not present

## 2023-12-23 DIAGNOSIS — K5900 Constipation, unspecified: Secondary | ICD-10-CM | POA: Diagnosis not present

## 2023-12-23 DIAGNOSIS — Z9842 Cataract extraction status, left eye: Secondary | ICD-10-CM | POA: Diagnosis not present

## 2023-12-23 DIAGNOSIS — Z466 Encounter for fitting and adjustment of urinary device: Secondary | ICD-10-CM | POA: Diagnosis not present

## 2023-12-23 DIAGNOSIS — K219 Gastro-esophageal reflux disease without esophagitis: Secondary | ICD-10-CM | POA: Diagnosis not present

## 2023-12-23 DIAGNOSIS — K635 Polyp of colon: Secondary | ICD-10-CM | POA: Diagnosis not present

## 2023-12-23 DIAGNOSIS — Z85828 Personal history of other malignant neoplasm of skin: Secondary | ICD-10-CM | POA: Diagnosis not present

## 2023-12-25 DIAGNOSIS — Z466 Encounter for fitting and adjustment of urinary device: Secondary | ICD-10-CM | POA: Diagnosis not present

## 2023-12-25 DIAGNOSIS — K635 Polyp of colon: Secondary | ICD-10-CM | POA: Diagnosis not present

## 2023-12-25 DIAGNOSIS — E119 Type 2 diabetes mellitus without complications: Secondary | ICD-10-CM | POA: Diagnosis not present

## 2023-12-25 DIAGNOSIS — M159 Polyosteoarthritis, unspecified: Secondary | ICD-10-CM | POA: Diagnosis not present

## 2023-12-25 DIAGNOSIS — E785 Hyperlipidemia, unspecified: Secondary | ICD-10-CM | POA: Diagnosis not present

## 2023-12-25 DIAGNOSIS — Z9842 Cataract extraction status, left eye: Secondary | ICD-10-CM | POA: Diagnosis not present

## 2023-12-25 DIAGNOSIS — R338 Other retention of urine: Secondary | ICD-10-CM | POA: Diagnosis not present

## 2023-12-25 DIAGNOSIS — I10 Essential (primary) hypertension: Secondary | ICD-10-CM | POA: Diagnosis not present

## 2023-12-25 DIAGNOSIS — L570 Actinic keratosis: Secondary | ICD-10-CM | POA: Diagnosis not present

## 2023-12-25 DIAGNOSIS — Z9841 Cataract extraction status, right eye: Secondary | ICD-10-CM | POA: Diagnosis not present

## 2023-12-25 DIAGNOSIS — Z7982 Long term (current) use of aspirin: Secondary | ICD-10-CM | POA: Diagnosis not present

## 2023-12-25 DIAGNOSIS — N281 Cyst of kidney, acquired: Secondary | ICD-10-CM | POA: Diagnosis not present

## 2023-12-25 DIAGNOSIS — M75101 Unspecified rotator cuff tear or rupture of right shoulder, not specified as traumatic: Secondary | ICD-10-CM | POA: Diagnosis not present

## 2023-12-25 DIAGNOSIS — N200 Calculus of kidney: Secondary | ICD-10-CM | POA: Diagnosis not present

## 2023-12-25 DIAGNOSIS — Z955 Presence of coronary angioplasty implant and graft: Secondary | ICD-10-CM | POA: Diagnosis not present

## 2023-12-25 DIAGNOSIS — Z471 Aftercare following joint replacement surgery: Secondary | ICD-10-CM | POA: Diagnosis not present

## 2023-12-25 DIAGNOSIS — I2510 Atherosclerotic heart disease of native coronary artery without angina pectoris: Secondary | ICD-10-CM | POA: Diagnosis not present

## 2023-12-25 DIAGNOSIS — K219 Gastro-esophageal reflux disease without esophagitis: Secondary | ICD-10-CM | POA: Diagnosis not present

## 2023-12-25 DIAGNOSIS — K5900 Constipation, unspecified: Secondary | ICD-10-CM | POA: Diagnosis not present

## 2023-12-25 DIAGNOSIS — Z85828 Personal history of other malignant neoplasm of skin: Secondary | ICD-10-CM | POA: Diagnosis not present

## 2023-12-25 DIAGNOSIS — Z96652 Presence of left artificial knee joint: Secondary | ICD-10-CM | POA: Diagnosis not present

## 2023-12-25 DIAGNOSIS — Z79891 Long term (current) use of opiate analgesic: Secondary | ICD-10-CM | POA: Diagnosis not present

## 2023-12-25 DIAGNOSIS — Z7984 Long term (current) use of oral hypoglycemic drugs: Secondary | ICD-10-CM | POA: Diagnosis not present

## 2023-12-26 ENCOUNTER — Telehealth: Payer: Self-pay | Admitting: *Deleted

## 2023-12-26 NOTE — Telephone Encounter (Signed)
Spoke with patient and wife today. Reports progress is slow, but improving some. Pain is factor limiting movement. They are icing, elevating, compression and doing home exercises. Finishes HHPT tomorrow and will see Dr. Magnus Ivan for his post op at end of week. Starts OPPT with [employer] on Thursday of this week.

## 2023-12-27 DIAGNOSIS — Z7984 Long term (current) use of oral hypoglycemic drugs: Secondary | ICD-10-CM | POA: Diagnosis not present

## 2023-12-27 DIAGNOSIS — K5900 Constipation, unspecified: Secondary | ICD-10-CM | POA: Diagnosis not present

## 2023-12-27 DIAGNOSIS — M75101 Unspecified rotator cuff tear or rupture of right shoulder, not specified as traumatic: Secondary | ICD-10-CM | POA: Diagnosis not present

## 2023-12-27 DIAGNOSIS — Z9842 Cataract extraction status, left eye: Secondary | ICD-10-CM | POA: Diagnosis not present

## 2023-12-27 DIAGNOSIS — K219 Gastro-esophageal reflux disease without esophagitis: Secondary | ICD-10-CM | POA: Diagnosis not present

## 2023-12-27 DIAGNOSIS — N281 Cyst of kidney, acquired: Secondary | ICD-10-CM | POA: Diagnosis not present

## 2023-12-27 DIAGNOSIS — Z466 Encounter for fitting and adjustment of urinary device: Secondary | ICD-10-CM | POA: Diagnosis not present

## 2023-12-27 DIAGNOSIS — I10 Essential (primary) hypertension: Secondary | ICD-10-CM | POA: Diagnosis not present

## 2023-12-27 DIAGNOSIS — I2510 Atherosclerotic heart disease of native coronary artery without angina pectoris: Secondary | ICD-10-CM | POA: Diagnosis not present

## 2023-12-27 DIAGNOSIS — E785 Hyperlipidemia, unspecified: Secondary | ICD-10-CM | POA: Diagnosis not present

## 2023-12-27 DIAGNOSIS — Z471 Aftercare following joint replacement surgery: Secondary | ICD-10-CM | POA: Diagnosis not present

## 2023-12-27 DIAGNOSIS — Z85828 Personal history of other malignant neoplasm of skin: Secondary | ICD-10-CM | POA: Diagnosis not present

## 2023-12-27 DIAGNOSIS — L570 Actinic keratosis: Secondary | ICD-10-CM | POA: Diagnosis not present

## 2023-12-27 DIAGNOSIS — R338 Other retention of urine: Secondary | ICD-10-CM | POA: Diagnosis not present

## 2023-12-27 DIAGNOSIS — Z7982 Long term (current) use of aspirin: Secondary | ICD-10-CM | POA: Diagnosis not present

## 2023-12-27 DIAGNOSIS — Z96652 Presence of left artificial knee joint: Secondary | ICD-10-CM | POA: Diagnosis not present

## 2023-12-27 DIAGNOSIS — Z955 Presence of coronary angioplasty implant and graft: Secondary | ICD-10-CM | POA: Diagnosis not present

## 2023-12-27 DIAGNOSIS — Z9841 Cataract extraction status, right eye: Secondary | ICD-10-CM | POA: Diagnosis not present

## 2023-12-27 DIAGNOSIS — N200 Calculus of kidney: Secondary | ICD-10-CM | POA: Diagnosis not present

## 2023-12-27 DIAGNOSIS — K635 Polyp of colon: Secondary | ICD-10-CM | POA: Diagnosis not present

## 2023-12-27 DIAGNOSIS — E119 Type 2 diabetes mellitus without complications: Secondary | ICD-10-CM | POA: Diagnosis not present

## 2023-12-27 DIAGNOSIS — Z79891 Long term (current) use of opiate analgesic: Secondary | ICD-10-CM | POA: Diagnosis not present

## 2023-12-27 DIAGNOSIS — M159 Polyosteoarthritis, unspecified: Secondary | ICD-10-CM | POA: Diagnosis not present

## 2023-12-28 ENCOUNTER — Other Ambulatory Visit: Payer: Self-pay

## 2023-12-28 ENCOUNTER — Ambulatory Visit: Payer: Medicare Other | Admitting: Orthopaedic Surgery

## 2023-12-28 ENCOUNTER — Encounter: Payer: Self-pay | Admitting: Orthopaedic Surgery

## 2023-12-28 ENCOUNTER — Ambulatory Visit: Payer: Medicare Other | Attending: Orthopaedic Surgery | Admitting: Physical Therapy

## 2023-12-28 DIAGNOSIS — M6281 Muscle weakness (generalized): Secondary | ICD-10-CM | POA: Insufficient documentation

## 2023-12-28 DIAGNOSIS — R262 Difficulty in walking, not elsewhere classified: Secondary | ICD-10-CM | POA: Insufficient documentation

## 2023-12-28 DIAGNOSIS — M25562 Pain in left knee: Secondary | ICD-10-CM | POA: Diagnosis not present

## 2023-12-28 DIAGNOSIS — R600 Localized edema: Secondary | ICD-10-CM | POA: Insufficient documentation

## 2023-12-28 DIAGNOSIS — M25662 Stiffness of left knee, not elsewhere classified: Secondary | ICD-10-CM | POA: Insufficient documentation

## 2023-12-28 DIAGNOSIS — M1712 Unilateral primary osteoarthritis, left knee: Secondary | ICD-10-CM | POA: Insufficient documentation

## 2023-12-28 DIAGNOSIS — Z96652 Presence of left artificial knee joint: Secondary | ICD-10-CM

## 2023-12-28 NOTE — Progress Notes (Signed)
The patient is an 83 year old gentleman who is here for his first postoperative visit status post a left total knee arthroplasty.  He is doing well.  He did have to have a catheter stay in his bladder after surgery and follows up with urology.  They were able to get the catheter out.  That was his main setback.  He starts outpatient physical therapy this afternoon.  On exam his left knee is swollen to be expected.  There is bruising to be expected.  His calf is soft.  He lacks full extension by about 5 degrees and I can only flex him to maybe 70 degrees.  He understands the importance of getting that knee bending and moving.  The incision looks good and the staples have been removed and Steri-Strips applied.  We will see him back in a month to see how he is doing from a range of motion standpoint.  He understands that if he is not close 90 or beyond 90 we may recommend a manipulation.  We will see what it looks like in a month.

## 2023-12-28 NOTE — Therapy (Signed)
OUTPATIENT PHYSICAL THERAPY LOWER EXTREMITY EVALUATION   Patient Name: Peter Blair MRN: [id number] DOB:05-26-1941, 83 y.o., male Today's Date: 12/28/2023  END OF SESSION:  PT End of Session - 12/28/23 1706     Visit Number 1    Number of Visits 17    Date for PT Re-Evaluation 02/22/24    Authorization Type [employer]    Progress Note Due on Visit 10    PT Start Time 1606    PT Stop Time 1645    PT Time Calculation (min) 39 min             Past Medical History:  Diagnosis Date   Coronary artery disease    exertional chest pain, cath 12/10 showed EF 50-55% with mild inferior hypokensis, 99% proximal and 60% distal RCA stenosis, 80% prox stenosis of a moderate sized D1, 40% prox LAD. Patient had BMS x2 placed in RCA. Unstable Angina. PROMUS DES to the prox RCA   Diabetes mellitus without complication (HCC)    Erectile dysfunction    GERD (gastroesophageal reflux disease)    Hyperlipidemia    Hypertension    Nephrolithiasis    Dr. Achilles Dunk   Renal cyst    Right shoulder pain    rotator cuff   Skin cancer    Skin cancer, PMH of, cell type unknown, Dr. Margo Aye   Past Surgical History:  Procedure Laterality Date   CATARACT EXTRACTION W/ INTRAOCULAR LENS IMPLANT Bilateral    colonoscopy with ploypectomy  03/05/2012    Dr Jarold Motto, GI   CORONARY ANGIOPLASTY WITH STENT PLACEMENT     X 3 total stents   cystoscopic removal  04/04/2010   CYSTOSCOPY N/A 12/15/2023   Procedure: CYSTOSCOPY FLEXIBLE; URETHERAL DILATION; INSERTION OF FOLEY CATHETER;  Surgeon: [name], MD;  Location: [facility];  Service: Urology;  Laterality: N/A;   Flexibe sigmoidoscopy   12/06/1999   LITHOTRIPSY     x 1   RECONSTRUCTION OF EYELID Left    TOTAL KNEE ARTHROPLASTY Left 12/15/2023   Procedure: LEFT TOTAL KNEE ARTHROPLASTY;  Surgeon: [name], MD;  Location: [facility];  Service: Orthopedics;  Laterality: Left;   Patient Active Problem List   Diagnosis Date Noted   Status post  total left knee replacement 12/15/2023   Unilateral primary osteoarthritis, right hip 09/25/2023   Actinic keratosis 10/17/2018   Type 2 diabetes mellitus with other circulatory complications (HCC) 12/11/2017   Osteoarthritis, multiple sites 10/11/2017   Preventative health care 08/05/2015   Advance directive discussed with patient 08/05/2015   GERD (gastroesophageal reflux disease)    Hyperplastic colonic polyp 12/17/2013   Benign prostatic hyperplasia with urinary obstruction 01/16/2013   ED (erectile dysfunction) of organic origin 01/16/2013   Coronary atherosclerosis of native coronary artery 11/20/2009   NEPHROLITHIASIS 09/29/2009   RENAL CYST, RIGHT 09/29/2009   Hyperlipemia 09/16/2008   SKIN CANCER, HX OF 09/16/2008   Essential hypertension, benign 07/09/2007    PCP: [name], MD  REFERRING PROVIDER: [name], MD  REFERRING DIAG:  [phone] (ICD-10-CM) - Unilateral primary osteoarthritis, left knee   THERAPY DIAG:  Stiffness of left knee, not elsewhere classified - Plan: PT plan of care cert/re-cert  Acute pain of left knee - Plan: PT plan of care cert/re-cert  Difficulty in walking, not elsewhere classified - Plan: PT plan of care cert/re-cert  Localized edema - Plan: PT plan of care cert/re-cert  Muscle weakness (generalized) - Plan: PT plan of care cert/re-cert  Rationale  for Evaluation and Treatment: Rehabilitation  ONSET DATE: 12/15/23  SUBJECTIVE:   SUBJECTIVE STATEMENT: Pt states he got his staples out today. Pt had HHPT until yesterday when he was d/c-ed (saw them 3 times). Pt states he has a history of R hip pain along with the L knee pain until he got this L TKA. Sleeps in a recliner currently. Still icing. Has been using compression socks but has had a hard time putting them on. Pt reports he feels behind because he had to stay 2 extra nights in the hospital and then had to go home with a catheter.   PERTINENT HISTORY: R hip  OA  PAIN:  Are you having pain? Yes: NPRS scale: 1/10 currently, 5 or 6 at worst Pain location: L knee Pain description: aching; occasional brief sharp pain Aggravating factors: Sleep Relieving factors: muscle relaxer and pain medication  PRECAUTIONS: Fall  RED FLAGS: None   WEIGHT BEARING RESTRICTIONS: No  FALLS:  Has patient fallen in last 6 months? Yes. Number of falls 1 in September. Fell in a ditch trying to chainsaw a tree that fell in the street  LIVING ENVIRONMENT: Lives with: lives with their spouse, [name] Lives in: House/apartment Stairs: Yes: External: 3 steps; on right going up Has following equipment at home: [name] - 2 wheeled  OCCUPATION: Retired - likes to fish  PLOF: Independent  PATIENT GOALS: Get back to fishing and normal activities  NEXT MD VISIT: 01/25/24  OBJECTIVE:  Note: Objective measures were completed at Evaluation unless otherwise noted.  DIAGNOSTIC FINDINGS: xray 12/15/23 IMPRESSION: Interval total left knee arthroplasty without evidence of hardware failure.  PATIENT SURVEYS:  Lower Extremity Functional Score: 26 / 80 = 32.5 %  COGNITION: Overall cognitive status: Within functional limits for tasks assessed     SENSATION: WFL  EDEMA:  L knee 50% bigger than R LE  MUSCLE LENGTH: Hamstrings: limited due to knee ROM on L Thomas test: Did not assess   LOWER EXTREMITY ROM:  Active ROM Right eval Left eval  Hip flexion    Hip extension    Hip abduction    Hip adduction    Hip internal rotation    Hip external rotation    Knee flexion 125 68  Knee extension -5 -35  Ankle dorsiflexion    Ankle plantarflexion    Ankle inversion    Ankle eversion     (Blank rows = not tested)  LOWER EXTREMITY MMT:  MMT Right eval Left eval  Hip flexion 4+ 3+  Hip extension    Hip abduction 4 4-  Hip adduction 5 5  Hip internal rotation    Hip external rotation    Knee flexion 5 3+  Knee extension 5 3- (limited due to ROM)   Ankle dorsiflexion    Ankle plantarflexion    Ankle inversion    Ankle eversion     (Blank rows = not tested)  FUNCTIONAL TESTS:  5 times sit to stand: L LE forward with bilat UE support; 23 sec Dynamic Gait Index: 14/24  [facility] PT Assessment - 12/28/23 0001       Standardized Balance Assessment   Standardized Balance Assessment Dynamic Gait Index      Dynamic Gait Index   Level Surface Mild Impairment    Change in Gait Speed Moderate Impairment    Gait with Horizontal Head Turns Normal    Gait with Vertical Head Turns Normal    Gait and Pivot Turn Mild Impairment  Step Over Obstacle Severe Impairment   unable to perform without UE support on RW   Step Around Obstacles Mild Impairment    Steps Moderate Impairment    Total Score 14    DGI comment: 14/24              GAIT: Distance walked: In clinic during DGI Assistive device utilized: Walker - 2 wheeled Level of assistance: Modified independence Comments: even step lengths bilat but diminished knee flexion during swing phase and limited knee ext during stance phase                                                                                                                                TREATMENT DATE: 12/28/23 See HEP below    PATIENT EDUCATION:  Education details: Exam findings, POC, initial HEP Person educated: Patient Education method: Explanation, Demonstration, and Handouts Education comprehension: verbalized understanding, returned demonstration, and needs further education  HOME EXERCISE PROGRAM: Access Code: [email or web address] URL: [email or web address] Date: 12/28/2023 Prepared by: Vernon Prey April Kirstie Peri  Exercises - Mini Squat with Counter Support  - 1 x daily - 7 x weekly - 2 sets - 10 reps - Standing Knee Flexion Stretch on Step  - 1 x daily - 7 x weekly - 2 sets - 10 reps - Long Sitting Quad Set with Towel Roll Under Heel  - 1 x daily - 7 x weekly - 2 sets - 10 reps - Seated Long  Arc Quad  - 1 x daily - 7 x weekly - 2 sets - 10 reps - Seated Hip Abduction with Resistance  - 1 x daily - 7 x weekly - 2 sets - 10 reps - Supine Hamstring Stretch with Strap  - 1 x daily - 7 x weekly - 2 sets - 30 sec hold  ASSESSMENT:  CLINICAL IMPRESSION: Patient is a 83 y.o. M who was seen today for physical therapy evaluation and treatment for L TKR on 12/15/23. PMH significant for R hip pain as well (plans for more imaging or a surgery after recovering from his L TKR). Assessment significant for very reduced knee ROM, edema, and decreased strength affecting home and community mobility. Pt will highly benefit from PT to address these deficits  OBJECTIVE IMPAIRMENTS: Abnormal gait, decreased activity tolerance, decreased balance, decreased endurance, decreased mobility, difficulty walking, decreased ROM, decreased strength, hypomobility, increased fascial restrictions, impaired flexibility, improper body mechanics, postural dysfunction, and pain.   ACTIVITY LIMITATIONS: carrying, lifting, bending, sitting, standing, squatting, sleeping, stairs, transfers, bed mobility, bathing, toileting, dressing, hygiene/grooming, and locomotion level  PARTICIPATION LIMITATIONS: meal prep, cleaning, driving, shopping, community activity, and yard work  PERSONAL FACTORS: Age, Fitness, Past/current experiences, and Time since onset of injury/illness/exacerbation are also affecting patient's functional outcome.   REHAB POTENTIAL: Good  CLINICAL DECISION MAKING: Evolving/moderate complexity  EVALUATION COMPLEXITY: Moderate   GOALS: Goals reviewed with patient? Yes   SHORT TERM GOALS: Target date: 01/25/2024  Independent with initial HEP. Baseline: Newly provided Goal status: INITIAL  2.  Pt will be able to perform 5x STS without keeping L foot in front of R Baseline: Increased weight on R with L foot in front Goal status: INITIAL  3.  Pt will have improved knee ROM to 10-90 deg Baseline:  35-68 deg in sitting Goal status: INITIAL   LONG TERM GOALS: Target date:  02/22/2024   Independent with advanced/ongoing HEP to improve outcomes and carryover.  Baseline: Initial HEP provided Goal status: INITIAL  2.  Alfonso Patten will demonstrate knee ROM from 5 to 120 deg to ascend/descend stairs. Baseline:  Goal status: INITIAL  3.  BRIAR STPIERRE will be able to ambulate 1000' safely with LRAD and normal gait pattern to access community.  Baseline:  Goal status: INITIAL  4.  Alfonso Patten will have improved LEFS to >/=42.5%  Baseline: 32.5% Goal status: INITIAL  5.  XAIVIER BELDIN will demonstrate > 19/24 on DGI to demonstrate decreased risk of falls.   Baseline: 14/24 Goal status: INITIAL  6.  Alfonso Patten will demonstrate improved functional LE strength by completing 5x STS in <15 seconds.  (MCID 5 seconds) Baseline: 23 sec Goal status: INITIAL   PLAN:  PT FREQUENCY: 2x/week  PT DURATION: 8 weeks  PLANNED INTERVENTIONS: 97164- PT Re-evaluation, 97110-Therapeutic exercises, 97530- Therapeutic activity, [id number]- Neuromuscular re-education, 97535- Self Care, [id number]- Manual therapy, [id number]- Gait training, [phone]- Aquatic Therapy, 97014- Electrical stimulation (unattended), 97016- Vasopneumatic device, [id number]- Ultrasound, [id number]- Ionotophoresis 4mg /ml Dexamethasone, Patient/Family education, Balance training, Stair training, Taping, Dry Needling, Joint mobilization, Spinal mobilization, Scar mobilization, DME instructions, Cryotherapy, and Moist heat  PLAN FOR NEXT SESSION: Assess response to HEP. Progress knee ROM as tolerated. L LE strengthening.    Emiliano Welshans April Ma L Andrell Bergeson, PT 12/28/2023, 5:07 PM

## 2024-01-01 ENCOUNTER — Other Ambulatory Visit: Payer: Self-pay | Admitting: Orthopaedic Surgery

## 2024-01-01 ENCOUNTER — Ambulatory Visit: Payer: Medicare Other | Admitting: Physical Therapy

## 2024-01-01 ENCOUNTER — Encounter: Payer: Self-pay | Admitting: Physical Therapy

## 2024-01-01 ENCOUNTER — Telehealth: Payer: Self-pay | Admitting: *Deleted

## 2024-01-01 DIAGNOSIS — R262 Difficulty in walking, not elsewhere classified: Secondary | ICD-10-CM | POA: Diagnosis not present

## 2024-01-01 DIAGNOSIS — M25562 Pain in left knee: Secondary | ICD-10-CM

## 2024-01-01 DIAGNOSIS — M1712 Unilateral primary osteoarthritis, left knee: Secondary | ICD-10-CM | POA: Diagnosis not present

## 2024-01-01 DIAGNOSIS — M6281 Muscle weakness (generalized): Secondary | ICD-10-CM

## 2024-01-01 DIAGNOSIS — R600 Localized edema: Secondary | ICD-10-CM

## 2024-01-01 DIAGNOSIS — M25662 Stiffness of left knee, not elsewhere classified: Secondary | ICD-10-CM | POA: Diagnosis not present

## 2024-01-01 MED ORDER — OXYCODONE HCL 5 MG PO TABS: 5.0000 mg | ORAL_TABLET | Freq: Four times a day (QID) | ORAL | 0 refills | Status: DC | PRN

## 2024-01-01 NOTE — Telephone Encounter (Signed)
Patient called requesting refill of pain medication. Thank you.

## 2024-01-01 NOTE — Therapy (Signed)
OUTPATIENT PHYSICAL THERAPY LOWER EXTREMITY EVALUATION   Patient Name: Peter Blair MRN: [id number] DOB:Oct 04, 1941, 83 y.o., male Today's Date: 01/01/2024  END OF SESSION:  PT End of Session - 01/01/24 1346     Visit Number 2    Date for PT Re-Evaluation 02/22/24    PT Start Time 1345    PT Stop Time 1430    PT Time Calculation (min) 45 min    Activity Tolerance Patient limited by fatigue;Patient tolerated treatment well    Behavior During Therapy [facility] for tasks assessed/performed             Past Medical History:  Diagnosis Date   Coronary artery disease    exertional chest pain, cath 12/10 showed EF 50-55% with mild inferior hypokensis, 99% proximal and 60% distal RCA stenosis, 80% prox stenosis of a moderate sized D1, 40% prox LAD. Patient had BMS x2 placed in RCA. Unstable Angina. PROMUS DES to the prox RCA   Diabetes mellitus without complication (HCC)    Erectile dysfunction    GERD (gastroesophageal reflux disease)    Hyperlipidemia    Hypertension    Nephrolithiasis    Dr. Achilles Dunk   Renal cyst    Right shoulder pain    rotator cuff   Skin cancer    Skin cancer, PMH of, cell type unknown, Dr. Margo Aye   Past Surgical History:  Procedure Laterality Date   CATARACT EXTRACTION W/ INTRAOCULAR LENS IMPLANT Bilateral    colonoscopy with ploypectomy  03/05/2012    Dr Jarold Motto, GI   CORONARY ANGIOPLASTY WITH STENT PLACEMENT     X 3 total stents   cystoscopic removal  04/04/2010   CYSTOSCOPY N/A 12/15/2023   Procedure: CYSTOSCOPY FLEXIBLE; URETHERAL DILATION; INSERTION OF FOLEY CATHETER;  Surgeon: [name], MD;  Location: [facility];  Service: Urology;  Laterality: N/A;   Flexibe sigmoidoscopy   12/06/1999   LITHOTRIPSY     x 1   RECONSTRUCTION OF EYELID Left    TOTAL KNEE ARTHROPLASTY Left 12/15/2023   Procedure: LEFT TOTAL KNEE ARTHROPLASTY;  Surgeon: [name], MD;  Location: [facility];  Service: Orthopedics;  Laterality: Left;   Patient Active  Problem List   Diagnosis Date Noted   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/15/2023   Unilateral primary osteoarthritis, right hip 09/25/2023   Actinic keratosis 10/17/2018   Type 2 diabetes mellitus with other circulatory complications (HCC) 12/11/2017   Osteoarthritis, multiple sites 10/11/2017   Preventative health care 08/05/2015   Advance directive discussed with patient 08/05/2015   GERD (gastroesophageal reflux disease)    Hyperplastic colonic polyp 12/17/2013   Benign prostatic hyperplasia with urinary obstruction 01/16/2013   ED (erectile dysfunction) of organic origin 01/16/2013   Coronary atherosclerosis of native coronary artery 11/20/2009   NEPHROLITHIASIS 09/29/2009   RENAL CYST, RIGHT 09/29/2009   Hyperlipemia 09/16/2008   SKIN CANCER, HX OF 09/16/2008   Essential hypertension, benign 07/09/2007    PCP: [name], MD  REFERRING PROVIDER: [name], MD  REFERRING DIAG:  [phone] (ICD-10-CM) - Unilateral primary osteoarthritis, left knee   THERAPY DIAG:  Stiffness of left knee, not elsewhere classified  Acute pain of left knee  Localized edema  Muscle weakness (generalized)  Difficulty in walking, not elsewhere classified  Rationale for Evaluation and Treatment: Rehabilitation  ONSET DATE: 12/15/23  SUBJECTIVE:   SUBJECTIVE STATEMENT: "I think I'm behind" thinks he should be able to bend his knee more  PERTINENT HISTORY: R hip OA  PAIN:  Are you having pain? Yes: NPRS scale: 0/10 Pain location: L knee Pain description: aching; occasional brief sharp pain Aggravating factors: Sleep Relieving factors: muscle relaxer and pain medication  PRECAUTIONS: Fall  RED FLAGS: None   WEIGHT BEARING RESTRICTIONS: No  FALLS:  Has patient fallen in last 6 months? Yes. Number of falls 1 in September. Fell in a ditch trying to chainsaw a tree that fell in the street  LIVING ENVIRONMENT: Lives with: lives with their spouse,  [name] Lives in: House/apartment Stairs: Yes: External: 3 steps; on right going up Has following equipment at home: [name] - 2 wheeled  OCCUPATION: Retired - likes to fish  PLOF: Independent  PATIENT GOALS: Get back to fishing and normal activities  NEXT MD VISIT: 01/25/24  OBJECTIVE:  Note: Objective measures were completed at Evaluation unless otherwise noted.  DIAGNOSTIC FINDINGS: xray 12/15/23 IMPRESSION: Interval total left knee arthroplasty without evidence of hardware failure.  PATIENT SURVEYS:  Lower Extremity Functional Score: 26 / 80 = 32.5 %  COGNITION: Overall cognitive status: Within functional limits for tasks assessed     SENSATION: WFL  EDEMA:  L knee 50% bigger than R LE  MUSCLE LENGTH: Hamstrings: limited due to knee ROM on L Thomas test: Did not assess   LOWER EXTREMITY ROM:  Active ROM Right eval Left eval  Hip flexion    Hip extension    Hip abduction    Hip adduction    Hip internal rotation    Hip external rotation    Knee flexion 125 68  Knee extension -5 -35  Ankle dorsiflexion    Ankle plantarflexion    Ankle inversion    Ankle eversion     (Blank rows = not tested)  LOWER EXTREMITY MMT:  MMT Right eval Left eval  Hip flexion 4+ 3+  Hip extension    Hip abduction 4 4-  Hip adduction 5 5  Hip internal rotation    Hip external rotation    Knee flexion 5 3+  Knee extension 5 3- (limited due to ROM)  Ankle dorsiflexion    Ankle plantarflexion    Ankle inversion    Ankle eversion     (Blank rows = not tested)  FUNCTIONAL TESTS:  5 times sit to stand: L LE forward with bilat UE support; 23 sec Dynamic Gait Index: 14/24     GAIT: Distance walked: In clinic during DGI Assistive device utilized: Walker - 2 wheeled Level of assistance: Modified independence Comments: even step lengths bilat but diminished knee flexion during swing phase and limited knee ext during stance phase                                                                                                                                 TREATMENT DATE:  01/01/24 Quad sets LLE L knee PROM  L tibiofemoral Jt mobs L knee flex & Ext stretch NuStep L 5 x6 min  LAQ 2lb 2x10  S2S 2x10 w/ UE assist  Gait 150 ft decrease stance time on LLE, Decrease LLE TKE L knee Vaso Med x 10 min  12/28/23 See HEP below    PATIENT EDUCATION:  Education details: Exam findings, POC, initial HEP Person educated: Patient Education method: Explanation, Demonstration, and Handouts Education comprehension: verbalized understanding, returned demonstration, and needs further education  HOME EXERCISE PROGRAM: Access Code: [email or web address] URL: [email or web address] Date: 12/28/2023 Prepared by: Vernon Prey April Kirstie Peri  Exercises - Mini Squat with Counter Support  - 1 x daily - 7 x weekly - 2 sets - 10 reps - Standing Knee Flexion Stretch on Step  - 1 x daily - 7 x weekly - 2 sets - 10 reps - Long Sitting Quad Set with Towel Roll Under Heel  - 1 x daily - 7 x weekly - 2 sets - 10 reps - Seated Long Arc Quad  - 1 x daily - 7 x weekly - 2 sets - 10 reps - Seated Hip Abduction with Resistance  - 1 x daily - 7 x weekly - 2 sets - 10 reps - Supine Hamstring Stretch with Strap  - 1 x daily - 7 x weekly - 2 sets - 30 sec hold  ASSESSMENT:  CLINICAL IMPRESSION: Patient is a 83 y.o. M who was seen today for physical therapy treatment for L TKR on 12/15/23. PMH significant for R hip pain as well (plans for more imaging or a surgery after recovering from his L TKR). L knee limited ROM and swelling. Pt has better TKE with closed chain interventions compared to open chain.  Cue needed to hold contraction with quad sets. Some end range pain with stretching and PROM.  Pt will highly benefit from PT to address these deficits.  OBJECTIVE IMPAIRMENTS: Abnormal gait, decreased activity tolerance, decreased balance, decreased endurance, decreased mobility, difficulty  walking, decreased ROM, decreased strength, hypomobility, increased fascial restrictions, impaired flexibility, improper body mechanics, postural dysfunction, and pain.   ACTIVITY LIMITATIONS: carrying, lifting, bending, sitting, standing, squatting, sleeping, stairs, transfers, bed mobility, bathing, toileting, dressing, hygiene/grooming, and locomotion level  PARTICIPATION LIMITATIONS: meal prep, cleaning, driving, shopping, community activity, and yard work  PERSONAL FACTORS: Age, Fitness, Past/current experiences, and Time since onset of injury/illness/exacerbation are also affecting patient's functional outcome.   REHAB POTENTIAL: Good  CLINICAL DECISION MAKING: Evolving/moderate complexity  EVALUATION COMPLEXITY: Moderate   GOALS: Goals reviewed with patient? Yes   SHORT TERM GOALS: Target date: 01/25/2024   Independent with initial HEP. Baseline: Newly provided Goal status: INITIAL  2.  Pt will be able to perform 5x STS without keeping L foot in front of R Baseline: Increased weight on R with L foot in front Goal status: INITIAL  3.  Pt will have improved knee ROM to 10-90 deg Baseline: 35-68 deg in sitting Goal status: INITIAL   LONG TERM GOALS: Target date:  02/22/2024   Independent with advanced/ongoing HEP to improve outcomes and carryover.  Baseline: Initial HEP provided Goal status: INITIAL  2.  Alfonso Patten will demonstrate knee ROM from 5 to 120 deg to ascend/descend stairs. Baseline:  Goal status: INITIAL  3.  AUBRY TUCHOLSKI will be able to ambulate 1000' safely with LRAD and normal gait pattern to access community.  Baseline:  Goal status: INITIAL  4.  Alfonso Patten will have improved LEFS to >/=42.5%  Baseline: 32.5% Goal status: INITIAL  5.  ARISTOTLE LIEB will demonstrate > 19/24 on DGI to demonstrate  decreased risk of falls.   Baseline: 14/24 Goal status: INITIAL  6.  Alfonso Patten will demonstrate improved functional LE strength by  completing 5x STS in <15 seconds.  (MCID 5 seconds) Baseline: 23 sec Goal status: INITIAL   PLAN:  PT FREQUENCY: 2x/week  PT DURATION: 8 weeks  PLANNED INTERVENTIONS: 97164- PT Re-evaluation, 97110-Therapeutic exercises, 97530- Therapeutic activity, [id number]- Neuromuscular re-education, 97535- Self Care, [id number]- Manual therapy, [id number]- Gait training, [phone]- Aquatic Therapy, 97014- Electrical stimulation (unattended), 97016- Vasopneumatic device, 97035- Ultrasound, 51884- Ionotophoresis 4mg /ml Dexamethasone, Patient/Family education, Balance training, Stair training, Taping, Dry Needling, Joint mobilization, Spinal mobilization, Scar mobilization, DME instructions, Cryotherapy, and Moist heat  PLAN FOR NEXT SESSION: Assess response to HEP. Progress knee ROM as tolerated. L LE strengthening.    Grayce Sessions, PTA 01/01/2024, 1:46 PM

## 2024-01-01 NOTE — Telephone Encounter (Signed)
Patient aware.

## 2024-01-04 ENCOUNTER — Ambulatory Visit: Payer: Medicare Other | Admitting: Physical Therapy

## 2024-01-04 DIAGNOSIS — M25662 Stiffness of left knee, not elsewhere classified: Secondary | ICD-10-CM

## 2024-01-04 DIAGNOSIS — M25562 Pain in left knee: Secondary | ICD-10-CM

## 2024-01-04 DIAGNOSIS — R262 Difficulty in walking, not elsewhere classified: Secondary | ICD-10-CM | POA: Diagnosis not present

## 2024-01-04 DIAGNOSIS — R600 Localized edema: Secondary | ICD-10-CM

## 2024-01-04 DIAGNOSIS — M6281 Muscle weakness (generalized): Secondary | ICD-10-CM | POA: Diagnosis not present

## 2024-01-04 DIAGNOSIS — M1712 Unilateral primary osteoarthritis, left knee: Secondary | ICD-10-CM | POA: Diagnosis not present

## 2024-01-04 NOTE — Therapy (Signed)
OUTPATIENT PHYSICAL THERAPY LOWER EXTREMITY TREATMENT   Patient Name: Peter Blair MRN: [id number] DOB:Apr 06, 1941, 83 y.o., male Today's Date: 01/04/2024  END OF SESSION:  PT End of Session - 01/04/24 1305     Visit Number 3    Date for PT Re-Evaluation 02/22/24    Authorization Type [employer]    PT Start Time 1302    PT Stop Time 1340    PT Time Calculation (min) 38 min    Activity Tolerance Patient limited by fatigue;Patient tolerated treatment well    Behavior During Therapy [facility] for tasks assessed/performed              Past Medical History:  Diagnosis Date   Coronary artery disease    exertional chest pain, cath 12/10 showed EF 50-55% with mild inferior hypokensis, 99% proximal and 60% distal RCA stenosis, 80% prox stenosis of a moderate sized D1, 40% prox LAD. Patient had BMS x2 placed in RCA. Unstable Angina. PROMUS DES to the prox RCA   Diabetes mellitus without complication (HCC)    Erectile dysfunction    GERD (gastroesophageal reflux disease)    Hyperlipidemia    Hypertension    Nephrolithiasis    Dr. Achilles Dunk   Renal cyst    Right shoulder pain    rotator cuff   Skin cancer    Skin cancer, PMH of, cell type unknown, Dr. Margo Aye   Past Surgical History:  Procedure Laterality Date   CATARACT EXTRACTION W/ INTRAOCULAR LENS IMPLANT Bilateral    colonoscopy with ploypectomy  03/05/2012    Dr Jarold Motto, GI   CORONARY ANGIOPLASTY WITH STENT PLACEMENT     X 3 total stents   cystoscopic removal  04/04/2010   CYSTOSCOPY N/A 12/15/2023   Procedure: CYSTOSCOPY FLEXIBLE; URETHERAL DILATION; INSERTION OF FOLEY CATHETER;  Surgeon: [name], MD;  Location: [facility];  Service: Urology;  Laterality: N/A;   Flexibe sigmoidoscopy   12/06/1999   LITHOTRIPSY     x 1   RECONSTRUCTION OF EYELID Left    TOTAL KNEE ARTHROPLASTY Left 12/15/2023   Procedure: LEFT TOTAL KNEE ARTHROPLASTY;  Surgeon: [name], MD;  Location: [facility];  Service: Orthopedics;   Laterality: Left;   Patient Active Problem List   Diagnosis Date Noted   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/15/2023   Unilateral primary osteoarthritis, right hip 09/25/2023   Actinic keratosis 10/17/2018   Type 2 diabetes mellitus with other circulatory complications (HCC) 12/11/2017   Osteoarthritis, multiple sites 10/11/2017   Preventative health care 08/05/2015   Advance directive discussed with patient 08/05/2015   GERD (gastroesophageal reflux disease)    Hyperplastic colonic polyp 12/17/2013   Benign prostatic hyperplasia with urinary obstruction 01/16/2013   ED (erectile dysfunction) of organic origin 01/16/2013   Coronary atherosclerosis of native coronary artery 11/20/2009   NEPHROLITHIASIS 09/29/2009   RENAL CYST, RIGHT 09/29/2009   Hyperlipemia 09/16/2008   SKIN CANCER, HX OF 09/16/2008   Essential hypertension, benign 07/09/2007    PCP: [name], MD  REFERRING PROVIDER: [name], MD  REFERRING DIAG:  [phone] (ICD-10-CM) - Unilateral primary osteoarthritis, left knee   THERAPY DIAG:  Stiffness of left knee, not elsewhere classified  Acute pain of left knee  Localized edema  Muscle weakness (generalized)  Difficulty in walking, not elsewhere classified  Rationale for Evaluation and Treatment: Rehabilitation  ONSET DATE: 12/15/23  SUBJECTIVE:   SUBJECTIVE STATEMENT: Pt reports he took his pain medication before coming in. Pt states he  was a little sore after last PT session.   PERTINENT HISTORY: R hip OA  PAIN:  Are you having pain? Yes: NPRS scale: 0/10 Pain location: L knee Pain description: aching; occasional brief sharp pain Aggravating factors: Sleep Relieving factors: muscle relaxer and pain medication  PRECAUTIONS: Fall  RED FLAGS: None   WEIGHT BEARING RESTRICTIONS: No  FALLS:  Has patient fallen in last 6 months? Yes. Number of falls 1 in September. Fell in a ditch trying to chainsaw a tree that fell  in the street  LIVING ENVIRONMENT: Lives with: lives with their spouse, [name] Lives in: House/apartment Stairs: Yes: External: 3 steps; on right going up Has following equipment at home: [name] - 2 wheeled  OCCUPATION: Retired - likes to fish  PLOF: Independent  PATIENT GOALS: Get back to fishing and normal activities  NEXT MD VISIT: 01/25/24  OBJECTIVE:  Note: Objective measures were completed at Evaluation unless otherwise noted.  DIAGNOSTIC FINDINGS: xray 12/15/23 IMPRESSION: Interval total left knee arthroplasty without evidence of hardware failure.  PATIENT SURVEYS:  Lower Extremity Functional Score: 26 / 80 = 32.5 %  COGNITION: Overall cognitive status: Within functional limits for tasks assessed     SENSATION: WFL  EDEMA:  L knee 50% bigger than R LE  MUSCLE LENGTH: Hamstrings: limited due to knee ROM on L Thomas test: Did not assess   LOWER EXTREMITY ROM:  Active ROM Right eval Left eval Left 01/04/24  Hip flexion     Hip extension     Hip abduction     Hip adduction     Hip internal rotation     Hip external rotation     Knee flexion 125 68 90  Knee extension -5 -35 -12  Ankle dorsiflexion     Ankle plantarflexion     Ankle inversion     Ankle eversion      (Blank rows = not tested)  LOWER EXTREMITY MMT:  MMT Right eval Left eval  Hip flexion 4+ 3+  Hip extension    Hip abduction 4 4-  Hip adduction 5 5  Hip internal rotation    Hip external rotation    Knee flexion 5 3+  Knee extension 5 3- (limited due to ROM)  Ankle dorsiflexion    Ankle plantarflexion    Ankle inversion    Ankle eversion     (Blank rows = not tested)  FUNCTIONAL TESTS:  5 times sit to stand: L LE forward with bilat UE support; 23 sec Dynamic Gait Index: 14/24     GAIT: Distance walked: In clinic during DGI Assistive device utilized: Walker - 2 wheeled Level of assistance: Modified independence Comments: even step lengths bilat but diminished knee  flexion during swing phase and limited knee ext during stance phase  TREATMENT DATE:  01/04/24 Nustep L5 x 5 min LEs/UEs L foot on 4" step knee bends with PT overpressure for knee flexion x10 Forward step up on 4" step 2x10 Side step up on 4" step 2x10 Step down 4" step 2x10 Counter support squat 2x10 Squat to chair x5  Sitting heel slide x5 with overpressure x5" to get to 90 deg of flexion Sitting hamstring stretch 2x30" Sitting quad set 2x10 with PT overpressure Standing terminal knee ext against ball on wall 3x10 Amb with SPC x 2 laps around gym Backwards walking with SPC Side stepping with [facility]  01/01/24 Quad sets LLE L knee PROM  L tibiofemoral Jt mobs L knee flex & Ext stretch NuStep L 5 x6 min LAQ 2lb 2x10  S2S 2x10 w/ UE assist  Gait 150 ft decrease stance time on LLE, Decrease LLE TKE L knee Vaso Med x 10 min  12/28/23 See HEP below    PATIENT EDUCATION:  Education details: Exam findings, POC, initial HEP Person educated: Patient Education method: Explanation, Demonstration, and Handouts Education comprehension: verbalized understanding, returned demonstration, and needs further education  HOME EXERCISE PROGRAM: Access Code: [email or web address] URL: [email or web address] Date: 01/04/2024 Prepared by: Vernon Prey April Kirstie Peri  Exercises - Standing Knee Flexion Stretch on Step  - 1 x daily - 7 x weekly - 2 sets - 10 reps - Long Sitting Quad Set with Towel Roll Under Heel  - 1 x daily - 7 x weekly - 2 sets - 10 reps - Supine Hamstring Stretch with Strap  - 1 x daily - 7 x weekly - 2 sets - 30 sec hold - Squat with Chair Touch  - 1 x daily - 7 x weekly - 3 sets - 10 reps - Step Up  - 1 x daily - 7 x weekly - 2 sets - 10 reps - Lateral Step Up  - 1 x daily - 7 x weekly - 2 sets - 10 reps - Forward Step Down Touch with Heel  - 1 x  daily - 7 x weekly - 2 sets - 10 reps  ASSESSMENT:  CLINICAL IMPRESSION: Patient is a 83 y.o. M who was seen today for physical therapy treatment for L TKR on 12/15/23. PMH significant for R hip pain as well (plans for more imaging or a surgery after recovering from his L TKR). Treatment focused on continuing to improve pt's knee ROM. Able to obtain 90 deg of knee flexion today. Pt with increasing L LE strength. Able to amb with SPC safely today. Updated HEP to include exercises on his steps at home.   OBJECTIVE IMPAIRMENTS: Abnormal gait, decreased activity tolerance, decreased balance, decreased endurance, decreased mobility, difficulty walking, decreased ROM, decreased strength, hypomobility, increased fascial restrictions, impaired flexibility, improper body mechanics, postural dysfunction, and pain.   ACTIVITY LIMITATIONS: carrying, lifting, bending, sitting, standing, squatting, sleeping, stairs, transfers, bed mobility, bathing, toileting, dressing, hygiene/grooming, and locomotion level  PARTICIPATION LIMITATIONS: meal prep, cleaning, driving, shopping, community activity, and yard work  PERSONAL FACTORS: Age, Fitness, Past/current experiences, and Time since onset of injury/illness/exacerbation are also affecting patient's functional outcome.   REHAB POTENTIAL: Good  CLINICAL DECISION MAKING: Evolving/moderate complexity  EVALUATION COMPLEXITY: Moderate   GOALS: Goals reviewed with patient? Yes   SHORT TERM GOALS: Target date: 01/25/2024   Independent with initial HEP. Baseline: Newly provided Goal status: INITIAL  2.  Pt will be able to perform 5x STS without keeping L foot in front of R Baseline: Increased weight on R  with L foot in front Goal status: INITIAL  3.  Pt will have improved knee ROM to 10-90 deg Baseline: 35-68 deg in sitting Goal status: INITIAL   LONG TERM GOALS: Target date:  02/22/2024   Independent with advanced/ongoing HEP to improve outcomes and  carryover.  Baseline: Initial HEP provided Goal status: INITIAL  2.  Alfonso Patten will demonstrate knee ROM from 5 to 120 deg to ascend/descend stairs. Baseline:  Goal status: INITIAL  3.  BAIN WHICHARD will be able to ambulate 1000' safely with LRAD and normal gait pattern to access community.  Baseline:  Goal status: INITIAL  4.  Alfonso Patten will have improved LEFS to >/=42.5%  Baseline: 32.5% Goal status: INITIAL  5.  HASAAN RADDE will demonstrate > 19/24 on DGI to demonstrate decreased risk of falls.   Baseline: 14/24 Goal status: INITIAL  6.  Alfonso Patten will demonstrate improved functional LE strength by completing 5x STS in <15 seconds.  (MCID 5 seconds) Baseline: 23 sec Goal status: INITIAL   PLAN:  PT FREQUENCY: 2x/week  PT DURATION: 8 weeks  PLANNED INTERVENTIONS: 97164- PT Re-evaluation, 97110-Therapeutic exercises, 97530- Therapeutic activity, [id number]- Neuromuscular re-education, 97535- Self Care, [id number]- Manual therapy, [id number]- Gait training, [phone]- Aquatic Therapy, 97014- Electrical stimulation (unattended), 97016- Vasopneumatic device, [id number]- Ultrasound, 84696- Ionotophoresis 4mg /ml Dexamethasone, Patient/Family education, Balance training, Stair training, Taping, Dry Needling, Joint mobilization, Spinal mobilization, Scar mobilization, DME instructions, Cryotherapy, and Moist heat  PLAN FOR NEXT SESSION: Assess response to HEP. Progress knee ROM as tolerated. L LE strengthening.    Kashayla Ungerer April Ma L Mouhamadou Gittleman, PT 01/04/2024, 1:05 PM

## 2024-01-08 ENCOUNTER — Ambulatory Visit: Payer: Medicare Other | Attending: Orthopaedic Surgery | Admitting: Physical Therapy

## 2024-01-08 ENCOUNTER — Encounter: Payer: Self-pay | Admitting: Physical Therapy

## 2024-01-08 DIAGNOSIS — M25662 Stiffness of left knee, not elsewhere classified: Secondary | ICD-10-CM | POA: Diagnosis not present

## 2024-01-08 DIAGNOSIS — R600 Localized edema: Secondary | ICD-10-CM | POA: Insufficient documentation

## 2024-01-08 DIAGNOSIS — R262 Difficulty in walking, not elsewhere classified: Secondary | ICD-10-CM | POA: Insufficient documentation

## 2024-01-08 DIAGNOSIS — M25562 Pain in left knee: Secondary | ICD-10-CM | POA: Diagnosis not present

## 2024-01-08 DIAGNOSIS — M6281 Muscle weakness (generalized): Secondary | ICD-10-CM | POA: Insufficient documentation

## 2024-01-08 NOTE — Therapy (Signed)
OUTPATIENT PHYSICAL THERAPY LOWER EXTREMITY TREATMENT   Patient Name: Peter Blair MRN: [id number] DOB:January 05, 1941, 83 y.o., male Today's Date: 01/08/2024  END OF SESSION:  PT End of Session - 01/08/24 1259     Visit Number 4    Date for PT Re-Evaluation 02/22/24    PT Start Time 1300    PT Stop Time 1645    PT Time Calculation (min) 225 min    Activity Tolerance Patient tolerated treatment well    Behavior During Therapy [facility] for tasks assessed/performed              Past Medical History:  Diagnosis Date   Coronary artery disease    exertional chest pain, cath 12/10 showed EF 50-55% with mild inferior hypokensis, 99% proximal and 60% distal RCA stenosis, 80% prox stenosis of a moderate sized D1, 40% prox LAD. Patient had BMS x2 placed in RCA. Unstable Angina. PROMUS DES to the prox RCA   Diabetes mellitus without complication (HCC)    Erectile dysfunction    GERD (gastroesophageal reflux disease)    Hyperlipidemia    Hypertension    Nephrolithiasis    Dr. Achilles Dunk   Renal cyst    Right shoulder pain    rotator cuff   Skin cancer    Skin cancer, PMH of, cell type unknown, Dr. Margo Aye   Past Surgical History:  Procedure Laterality Date   CATARACT EXTRACTION W/ INTRAOCULAR LENS IMPLANT Bilateral    colonoscopy with ploypectomy  03/05/2012    Dr Jarold Motto, GI   CORONARY ANGIOPLASTY WITH STENT PLACEMENT     X 3 total stents   cystoscopic removal  04/04/2010   CYSTOSCOPY N/A 12/15/2023   Procedure: CYSTOSCOPY FLEXIBLE; URETHERAL DILATION; INSERTION OF FOLEY CATHETER;  Surgeon: [name], MD;  Location: [facility];  Service: Urology;  Laterality: N/A;   Flexibe sigmoidoscopy   12/06/1999   LITHOTRIPSY     x 1   RECONSTRUCTION OF EYELID Left    TOTAL KNEE ARTHROPLASTY Left 12/15/2023   Procedure: LEFT TOTAL KNEE ARTHROPLASTY;  Surgeon: [name], MD;  Location: [facility];  Service: Orthopedics;  Laterality: Left;   Patient Active Problem List   Diagnosis  Date Noted   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/15/2023   Unilateral primary osteoarthritis, right hip 09/25/2023   Actinic keratosis 10/17/2018   Type 2 diabetes mellitus with other circulatory complications (HCC) 12/11/2017   Osteoarthritis, multiple sites 10/11/2017   Preventative health care 08/05/2015   Advance directive discussed with patient 08/05/2015   GERD (gastroesophageal reflux disease)    Hyperplastic colonic polyp 12/17/2013   Benign prostatic hyperplasia with urinary obstruction 01/16/2013   ED (erectile dysfunction) of organic origin 01/16/2013   Coronary atherosclerosis of native coronary artery 11/20/2009   NEPHROLITHIASIS 09/29/2009   RENAL CYST, RIGHT 09/29/2009   Hyperlipemia 09/16/2008   SKIN CANCER, HX OF 09/16/2008   Essential hypertension, benign 07/09/2007    PCP: [name], MD  REFERRING PROVIDER: [name], MD  REFERRING DIAG:  [phone] (ICD-10-CM) - Unilateral primary osteoarthritis, left knee   THERAPY DIAG:  Stiffness of left knee, not elsewhere classified  Localized edema  Muscle weakness (generalized)  Difficulty in walking, not elsewhere classified  Acute pain of left knee  Rationale for Evaluation and Treatment: Rehabilitation  ONSET DATE: 12/15/23  SUBJECTIVE:   SUBJECTIVE STATEMENT: Doing ok, just having trouble sleeping can't get comfortable  PERTINENT HISTORY: R hip OA  PAIN:  Are you having pain? Yes:  NPRS scale: 0/10 Pain location: L knee Pain description: aching; occasional brief sharp pain Aggravating factors: Sleep Relieving factors: muscle relaxer and pain medication  PRECAUTIONS: Fall  RED FLAGS: None   WEIGHT BEARING RESTRICTIONS: No  FALLS:  Has patient fallen in last 6 months? Yes. Number of falls 1 in September. Fell in a ditch trying to chainsaw a tree that fell in the street  LIVING ENVIRONMENT: Lives with: lives with their spouse, [name] Lives in:  House/apartment Stairs: Yes: External: 3 steps; on right going up Has following equipment at home: [name] - 2 wheeled  OCCUPATION: Retired - likes to fish  PLOF: Independent  PATIENT GOALS: Get back to fishing and normal activities  NEXT MD VISIT: 01/25/24  OBJECTIVE:  Note: Objective measures were completed at Evaluation unless otherwise noted.  DIAGNOSTIC FINDINGS: xray 12/15/23 IMPRESSION: Interval total left knee arthroplasty without evidence of hardware failure.  PATIENT SURVEYS:  Lower Extremity Functional Score: 26 / 80 = 32.5 %  COGNITION: Overall cognitive status: Within functional limits for tasks assessed     SENSATION: WFL  EDEMA:  L knee 50% bigger than R LE  MUSCLE LENGTH: Hamstrings: limited due to knee ROM on L Thomas test: Did not assess   LOWER EXTREMITY ROM:  Active ROM Right eval Left eval Left 01/04/24  Hip flexion     Hip extension     Hip abduction     Hip adduction     Hip internal rotation     Hip external rotation     Knee flexion 125 68 90  Knee extension -5 -35 -12  Ankle dorsiflexion     Ankle plantarflexion     Ankle inversion     Ankle eversion      (Blank rows = not tested)  LOWER EXTREMITY MMT:  MMT Right eval Left eval  Hip flexion 4+ 3+  Hip extension    Hip abduction 4 4-  Hip adduction 5 5  Hip internal rotation    Hip external rotation    Knee flexion 5 3+  Knee extension 5 3- (limited due to ROM)  Ankle dorsiflexion    Ankle plantarflexion    Ankle inversion    Ankle eversion     (Blank rows = not tested)  FUNCTIONAL TESTS:  5 times sit to stand: L LE forward with bilat UE support; 23 sec Dynamic Gait Index: 14/24     GAIT: Distance walked: In clinic during DGI Assistive device utilized: Walker - 2 wheeled Level of assistance: Modified independence Comments: even step lengths bilat but diminished knee flexion during swing phase and limited knee ext during stance phase                                                                                                                                 TREATMENT DATE:  01/08/24 NuStep L 5 x 6 min  L knee PROM   L tibiofemoral Jt  mobs L knee flex & Ext stretch S2S 2x10 Leg Press 40lb 2x10, LLE 20lb 2x5 SAQ LLE 2lb 2x10 LLE 2lb SLR x10, w/ ER x10  01/04/24 Nustep L5 x 5 min LEs/UEs L foot on 4" step knee bends with PT overpressure for knee flexion x10 Forward step up on 4" step 2x10 Side step up on 4" step 2x10 Step down 4" step 2x10 Counter support squat 2x10 Squat to chair x5  Sitting heel slide x5 with overpressure x5" to get to 90 deg of flexion Sitting hamstring stretch 2x30" Sitting quad set 2x10 with PT overpressure Standing terminal knee ext against ball on wall 3x10 Amb with SPC x 2 laps around gym Backwards walking with SPC Side stepping with [facility]  01/01/24 Quad sets LLE L knee PROM  L tibiofemoral Jt mobs L knee flex & Ext stretch NuStep L 5 x6 min LAQ 2lb 2x10  S2S 2x10 w/ UE assist  Gait 150 ft decrease stance time on LLE, Decrease LLE TKE L knee Vaso Med x 10 min  12/28/23 See HEP below    PATIENT EDUCATION:  Education details: Exam findings, POC, initial HEP Person educated: Patient Education method: Explanation, Demonstration, and Handouts Education comprehension: verbalized understanding, returned demonstration, and needs further education  HOME EXERCISE PROGRAM: Access Code: [email or web address] URL: [email or web address] Date: 01/04/2024 Prepared by: Vernon Prey April Kirstie Peri  Exercises - Standing Knee Flexion Stretch on Step  - 1 x daily - 7 x weekly - 2 sets - 10 reps - Long Sitting Quad Set with Towel Roll Under Heel  - 1 x daily - 7 x weekly - 2 sets - 10 reps - Supine Hamstring Stretch with Strap  - 1 x daily - 7 x weekly - 2 sets - 30 sec hold - Squat with Chair Touch  - 1 x daily - 7 x weekly - 3 sets - 10 reps - Step Up  - 1 x daily - 7 x weekly - 2 sets - 10 reps - Lateral Step Up  -  1 x daily - 7 x weekly - 2 sets - 10 reps - Forward Step Down Touch with Heel  - 1 x daily - 7 x weekly - 2 sets - 10 reps  ASSESSMENT:  CLINICAL IMPRESSION: Patient is a 83 y.o. M who was seen today for physical therapy treatment for L TKR on 12/15/23. PMH significant for R hip pain as well (plans for more imaging or a surgery after recovering from his L TKR). Treatment focused on continuing to improve pt's knee ROM and strength. L knee PROM appeared to be past 90 with MT. Pt continues to have a difficulty time contracting quad with LLE extended.    OBJECTIVE IMPAIRMENTS: Abnormal gait, decreased activity tolerance, decreased balance, decreased endurance, decreased mobility, difficulty walking, decreased ROM, decreased strength, hypomobility, increased fascial restrictions, impaired flexibility, improper body mechanics, postural dysfunction, and pain.   ACTIVITY LIMITATIONS: carrying, lifting, bending, sitting, standing, squatting, sleeping, stairs, transfers, bed mobility, bathing, toileting, dressing, hygiene/grooming, and locomotion level  PARTICIPATION LIMITATIONS: meal prep, cleaning, driving, shopping, community activity, and yard work  PERSONAL FACTORS: Age, Fitness, Past/current experiences, and Time since onset of injury/illness/exacerbation are also affecting patient's functional outcome.   REHAB POTENTIAL: Good  CLINICAL DECISION MAKING: Evolving/moderate complexity  EVALUATION COMPLEXITY: Moderate   GOALS: Goals reviewed with patient? Yes   SHORT TERM GOALS: Target date: 01/25/2024   Independent with initial HEP. Baseline: Newly provided Goal status: Progressing 01/08/24  2.  Pt will  be able to perform 5x STS without keeping L foot in front of R Baseline: Increased weight on R with L foot in front Goal status: INITIAL  3.  Pt will have improved knee ROM to 10-90 deg Baseline: 35-68 deg in sitting Goal status: INITIAL   LONG TERM GOALS: Target date:   02/22/2024   Independent with advanced/ongoing HEP to improve outcomes and carryover.  Baseline: Initial HEP provided Goal status: INITIAL  2.  Alfonso Patten will demonstrate knee ROM from 5 to 120 deg to ascend/descend stairs. Baseline:  Goal status: INITIAL  3.  ARYON NHAM will be able to ambulate 1000' safely with LRAD and normal gait pattern to access community.  Baseline:  Goal status: INITIAL  4.  Alfonso Patten will have improved LEFS to >/=42.5%  Baseline: 32.5% Goal status: INITIAL  5.  GLENMORE KARL will demonstrate > 19/24 on DGI to demonstrate decreased risk of falls.   Baseline: 14/24 Goal status: INITIAL  6.  Alfonso Patten will demonstrate improved functional LE strength by completing 5x STS in <15 seconds.  (MCID 5 seconds) Baseline: 23 sec Goal status: INITIAL   PLAN:  PT FREQUENCY: 2x/week  PT DURATION: 8 weeks  PLANNED INTERVENTIONS: 97164- PT Re-evaluation, 97110-Therapeutic exercises, 97530- Therapeutic activity, [id number]- Neuromuscular re-education, 97535- Self Care, 16109- Manual therapy, [id number]- Gait training, [phone]- Aquatic Therapy, 97014- Electrical stimulation (unattended), 97016- Vasopneumatic device, [id number]- Ultrasound, 09811- Ionotophoresis 4mg /ml Dexamethasone, Patient/Family education, Balance training, Stair training, Taping, Dry Needling, Joint mobilization, Spinal mobilization, Scar mobilization, DME instructions, Cryotherapy, and Moist heat  PLAN FOR NEXT SESSION: Progress knee ROM as tolerated. L LE strengthening.    Grayce Sessions, PTA 01/08/2024, 1:00 PM

## 2024-01-11 ENCOUNTER — Ambulatory Visit: Payer: Medicare Other | Admitting: Physical Therapy

## 2024-01-11 ENCOUNTER — Encounter: Payer: Self-pay | Admitting: Physical Therapy

## 2024-01-11 DIAGNOSIS — M6281 Muscle weakness (generalized): Secondary | ICD-10-CM

## 2024-01-11 DIAGNOSIS — R262 Difficulty in walking, not elsewhere classified: Secondary | ICD-10-CM | POA: Diagnosis not present

## 2024-01-11 DIAGNOSIS — R600 Localized edema: Secondary | ICD-10-CM | POA: Diagnosis not present

## 2024-01-11 DIAGNOSIS — M25662 Stiffness of left knee, not elsewhere classified: Secondary | ICD-10-CM | POA: Diagnosis not present

## 2024-01-11 DIAGNOSIS — M25562 Pain in left knee: Secondary | ICD-10-CM | POA: Diagnosis not present

## 2024-01-11 NOTE — Therapy (Signed)
 OUTPATIENT PHYSICAL THERAPY LOWER EXTREMITY TREATMENT   Patient Name: Peter Blair MRN: [id number] DOB:04-14-1941, 83 y.o., male Today's Date: 01/11/2024  END OF SESSION:  PT End of Session - 01/11/24 1259     Visit Number 5    Date for PT Re-Evaluation 02/22/24    PT Start Time 1300    PT Stop Time 1345    PT Time Calculation (min) 45 min    Activity Tolerance Patient tolerated treatment well    Behavior During Therapy [facility] for tasks assessed/performed              Past Medical History:  Diagnosis Date   Coronary artery disease    exertional chest pain, cath 12/10 showed EF 50-55% with mild inferior hypokensis, 99% proximal and 60% distal RCA stenosis, 80% prox stenosis of a moderate sized D1, 40% prox LAD. Patient had BMS x2 placed in RCA. Unstable Angina. PROMUS DES to the prox RCA   Diabetes mellitus without complication (HCC)    Erectile dysfunction    GERD (gastroesophageal reflux disease)    Hyperlipidemia    Hypertension    Nephrolithiasis    Dr. Ike   Renal cyst    Right shoulder pain    rotator cuff   Skin cancer    Skin cancer, PMH of, cell type unknown, Dr. Shona   Past Surgical History:  Procedure Laterality Date   CATARACT EXTRACTION W/ INTRAOCULAR LENS IMPLANT Bilateral    colonoscopy with ploypectomy  03/05/2012    Dr Jakie, GI   CORONARY ANGIOPLASTY WITH STENT PLACEMENT     X 3 total stents   cystoscopic removal  04/04/2010   CYSTOSCOPY N/A 12/15/2023   Procedure: CYSTOSCOPY FLEXIBLE; URETHERAL DILATION; INSERTION OF FOLEY CATHETER;  Surgeon: Cam Morene ORN, MD;  Location: [facility];  Service: Urology;  Laterality: N/A;   Flexibe sigmoidoscopy   12/06/1999   LITHOTRIPSY     x 1   RECONSTRUCTION OF EYELID Left    TOTAL KNEE ARTHROPLASTY Left 12/15/2023   Procedure: LEFT TOTAL KNEE ARTHROPLASTY;  Surgeon: Vernetta Lonni GRADE, MD;  Location: [facility];  Service: Orthopedics;  Laterality: Left;   Patient Active Problem List   Diagnosis  Date Noted   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/15/2023   Unilateral primary osteoarthritis, right hip 09/25/2023   Actinic keratosis 10/17/2018   Type 2 diabetes mellitus with other circulatory complications (HCC) 12/11/2017   Osteoarthritis, multiple sites 10/11/2017   Preventative health care 08/05/2015   Advance directive discussed with patient 08/05/2015   GERD (gastroesophageal reflux disease)    Hyperplastic colonic polyp 12/17/2013   Benign prostatic hyperplasia with urinary obstruction 01/16/2013   ED (erectile dysfunction) of organic origin 01/16/2013   Coronary atherosclerosis of native coronary artery 11/20/2009   NEPHROLITHIASIS 09/29/2009   RENAL CYST, RIGHT 09/29/2009   Hyperlipemia 09/16/2008   SKIN CANCER, HX OF 09/16/2008   Essential hypertension, benign 07/09/2007    PCP: Jimmy Charlie FERNS, MD  REFERRING PROVIDER: Vernetta Lonni GRADE, MD  REFERRING DIAG:  [phone] (ICD-10-CM) - Unilateral primary osteoarthritis, left knee   THERAPY DIAG:  Stiffness of left knee, not elsewhere classified  Difficulty in walking, not elsewhere classified  Localized edema  Muscle weakness (generalized)  Rationale for Evaluation and Treatment: Rehabilitation  ONSET DATE: 12/15/23  SUBJECTIVE:   SUBJECTIVE STATEMENT: Pt reports that he went to sit down in a low chair, LLE gripped the floor instead of sliding out bending his L knee causing pain.  PERTINENT HISTORY:  R hip OA  PAIN:  Are you having pain? Yes: NPRS scale: 0/10 Pain location: L knee Pain description: aching; occasional brief sharp pain Aggravating factors: Sleep Relieving factors: muscle relaxer and pain medication  PRECAUTIONS: Fall  RED FLAGS: None   WEIGHT BEARING RESTRICTIONS: No  FALLS:  Has patient fallen in last 6 months? Yes. Number of falls 1 in September. Fell in a ditch trying to chainsaw a tree that fell in the street  LIVING ENVIRONMENT: Lives with: lives with their  spouse, Dickey Lives in: House/apartment Stairs: Yes: External: 3 steps; on right going up Has following equipment at home: Vannie - 2 wheeled  OCCUPATION: Retired - likes to fish  PLOF: Independent  PATIENT GOALS: Get back to fishing and normal activities  NEXT MD VISIT: 01/25/24  OBJECTIVE:  Note: Objective measures were completed at Evaluation unless otherwise noted.  DIAGNOSTIC FINDINGS: xray 12/15/23 IMPRESSION: Interval total left knee arthroplasty without evidence of hardware failure.  PATIENT SURVEYS:  Lower Extremity Functional Score: 26 / 80 = 32.5 %  COGNITION: Overall cognitive status: Within functional limits for tasks assessed     SENSATION: WFL  EDEMA:  L knee 50% bigger than R LE  MUSCLE LENGTH: Hamstrings: limited due to knee ROM on L Thomas test: Did not assess   LOWER EXTREMITY ROM:  Active ROM Right eval Left eval Left 01/04/24  Hip flexion     Hip extension     Hip abduction     Hip adduction     Hip internal rotation     Hip external rotation     Knee flexion 125 68 90  Knee extension -5 -35 -12  Ankle dorsiflexion     Ankle plantarflexion     Ankle inversion     Ankle eversion      (Blank rows = not tested)  LOWER EXTREMITY MMT:  MMT Right eval Left eval  Hip flexion 4+ 3+  Hip extension    Hip abduction 4 4-  Hip adduction 5 5  Hip internal rotation    Hip external rotation    Knee flexion 5 3+  Knee extension 5 3- (limited due to ROM)  Ankle dorsiflexion    Ankle plantarflexion    Ankle inversion    Ankle eversion     (Blank rows = not tested)  FUNCTIONAL TESTS:  5 times sit to stand: L LE forward with bilat UE support; 23 sec Dynamic Gait Index: 14/24     GAIT: Distance walked: In clinic during DGI Assistive device utilized: Walker - 2 wheeled Level of assistance: Modified independence Comments: even step lengths bilat but diminished knee flexion during swing phase and limited knee ext during stance  phase                                                                                                                                TREATMENT DATE:  01/11/24 NuStep L 5 x  6 min L knee PROM   L tibiofemoral Jt mobs L knee flex & Ext stretch Quad sets LLE x 10 HS curls AROM LLE x10 Vaso L knee low pressure x10 minuted  01/08/24 NuStep L 5 x 6 min  L knee PROM   L tibiofemoral Jt mobs L knee flex & Ext stretch S2S 2x10 Leg Press 40lb 2x10, LLE 20lb 2x5 SAQ LLE 2lb 2x10 LLE 2lb SLR x10, w/ ER x10  01/04/24 Nustep L5 x 5 min LEs/UEs L foot on 4 step knee bends with PT overpressure for knee flexion x10 Forward step up on 4 step 2x10 Side step up on 4 step 2x10 Step down 4 step 2x10 Counter support squat 2x10 Squat to chair x5  Sitting heel slide x5 with overpressure x5 to get to 90 deg of flexion Sitting hamstring stretch 2x30 Sitting quad set 2x10 with PT overpressure Standing terminal knee ext against ball on wall 3x10 Amb with SPC x 2 laps around gym Backwards walking with SPC Side stepping with [facility]  01/01/24 Quad sets LLE L knee PROM  L tibiofemoral Jt mobs L knee flex & Ext stretch NuStep L 5 x6 min LAQ 2lb 2x10  S2S 2x10 w/ UE assist  Gait 150 ft decrease stance time on LLE, Decrease LLE TKE L knee Vaso Med x 10 min  12/28/23 See HEP below    PATIENT EDUCATION:  Education details: Exam findings, POC, initial HEP Person educated: Patient Education method: Explanation, Demonstration, and Handouts Education comprehension: verbalized understanding, returned demonstration, and needs further education  HOME EXERCISE PROGRAM: Access Code: [email or web address] URL: [email or web address] Date: 01/04/2024 Prepared by: Gellen April Earnie Starring  Exercises - Standing Knee Flexion Stretch on Step  - 1 x daily - 7 x weekly - 2 sets - 10 reps - Long Sitting Quad Set with Towel Roll Under Heel  - 1 x daily - 7 x weekly - 2 sets - 10 reps - Supine Hamstring  Stretch with Strap  - 1 x daily - 7 x weekly - 2 sets - 30 sec hold - Squat with Chair Touch  - 1 x daily - 7 x weekly - 3 sets - 10 reps - Step Up  - 1 x daily - 7 x weekly - 2 sets - 10 reps - Lateral Step Up  - 1 x daily - 7 x weekly - 2 sets - 10 reps - Forward Step Down Touch with Heel  - 1 x daily - 7 x weekly - 2 sets - 10 reps  ASSESSMENT:  CLINICAL IMPRESSION: Patient is a 83 y.o. M who was seen today for physical therapy treatment for L TKR on 12/15/23. PMH significant for R hip pain as well (plans for more imaging or a surgery after recovering from his L TKR). Session limited today's by pain. Pt reports over bending his L knee mistakenly when sitting down to a low surface yesterday. Pt was very guarded during today's session, not allowing much ROM. Added vaso to hel with pain and swelling  OBJECTIVE IMPAIRMENTS: Abnormal gait, decreased activity tolerance, decreased balance, decreased endurance, decreased mobility, difficulty walking, decreased ROM, decreased strength, hypomobility, increased fascial restrictions, impaired flexibility, improper body mechanics, postural dysfunction, and pain.   ACTIVITY LIMITATIONS: carrying, lifting, bending, sitting, standing, squatting, sleeping, stairs, transfers, bed mobility, bathing, toileting, dressing, hygiene/grooming, and locomotion level  PARTICIPATION LIMITATIONS: meal prep, cleaning, driving, shopping, community activity, and yard work  PERSONAL FACTORS: Age, Fitness, Past/current experiences, and Time since onset of  injury/illness/exacerbation are also affecting patient's functional outcome.   REHAB POTENTIAL: Good  CLINICAL DECISION MAKING: Evolving/moderate complexity  EVALUATION COMPLEXITY: Moderate   GOALS: Goals reviewed with patient? Yes   SHORT TERM GOALS: Target date: 01/25/2024   Independent with initial HEP. Baseline: Newly provided Goal status: Progressing 01/08/24  2.  Pt will be able to perform 5x STS without  keeping L foot in front of R Baseline: Increased weight on R with L foot in front Goal status: INITIAL  3.  Pt will have improved knee ROM to 10-90 deg Baseline: 35-68 deg in sitting Goal status: INITIAL   LONG TERM GOALS: Target date:  02/22/2024   Independent with advanced/ongoing HEP to improve outcomes and carryover.  Baseline: Initial HEP provided Goal status: INITIAL  2.  [name] will demonstrate knee ROM from 5 to 120 deg to ascend/descend stairs. Baseline:  Goal status: INITIAL  3.  KIETH HARTIS will be able to ambulate 1000' safely with LRAD and normal gait pattern to access community.  Baseline:  Goal status: INITIAL  4.  [name] will have improved LEFS to >/=42.5%  Baseline: 32.5% Goal status: INITIAL  5.  PEDER ALLUMS will demonstrate > 19/24 on DGI to demonstrate decreased risk of falls.   Baseline: 14/24 Goal status: INITIAL  6.  [name] will demonstrate improved functional LE strength by completing 5x STS in <15 seconds.  (MCID 5 seconds) Baseline: 23 sec Goal status: INITIAL   PLAN:  PT FREQUENCY: 2x/week  PT DURATION: 8 weeks  PLANNED INTERVENTIONS: 97164- PT Re-evaluation, 97110-Therapeutic exercises, 97530- Therapeutic activity, [id number]- Neuromuscular re-education, 97535- Self Care, [id number]- Manual therapy, [id number]- Gait training, [phone]- Aquatic Therapy, 97014- Electrical stimulation (unattended), 97016- Vasopneumatic device, 97035- Ultrasound, 02966- Ionotophoresis 4mg /ml Dexamethasone , Patient/Family education, Balance training, Stair training, Taping, Dry Needling, Joint mobilization, Spinal mobilization, Scar mobilization, DME instructions, Cryotherapy, and Moist heat  PLAN FOR NEXT SESSION: Progress knee ROM as tolerated. L LE strengthening.    Tanda KANDICE Sorrow, PTA 01/11/2024, 12:59 PM

## 2024-01-15 ENCOUNTER — Encounter: Payer: Self-pay | Admitting: Physical Therapy

## 2024-01-15 ENCOUNTER — Ambulatory Visit: Payer: Medicare Other | Admitting: Physical Therapy

## 2024-01-15 DIAGNOSIS — R600 Localized edema: Secondary | ICD-10-CM

## 2024-01-15 DIAGNOSIS — M25662 Stiffness of left knee, not elsewhere classified: Secondary | ICD-10-CM | POA: Diagnosis not present

## 2024-01-15 DIAGNOSIS — M25562 Pain in left knee: Secondary | ICD-10-CM

## 2024-01-15 DIAGNOSIS — R262 Difficulty in walking, not elsewhere classified: Secondary | ICD-10-CM

## 2024-01-15 DIAGNOSIS — M6281 Muscle weakness (generalized): Secondary | ICD-10-CM

## 2024-01-15 NOTE — Therapy (Signed)
 OUTPATIENT PHYSICAL THERAPY LOWER EXTREMITY TREATMENT   Patient Name: KOBYN MEDAL MRN: [id number] DOB:Jan 29, 1941, 83 y.o., male Today's Date: 01/15/2024  END OF SESSION:  PT End of Session - 01/15/24 1401     Visit Number 6    Number of Visits 17    Date for PT Re-Evaluation 02/22/24    Authorization Type [employer] Medicare    Progress Note Due on Visit 10    PT Start Time 1343    PT Stop Time 1427    PT Time Calculation (min) 44 min    Activity Tolerance Patient limited by pain    Behavior During Therapy [facility] for tasks assessed/performed               Past Medical History:  Diagnosis Date   Coronary artery disease    exertional chest pain, cath 12/10 showed EF 50-55% with mild inferior hypokensis, 99% proximal and 60% distal RCA stenosis, 80% prox stenosis of a moderate sized D1, 40% prox LAD. Patient had BMS x2 placed in RCA. Unstable Angina. PROMUS DES to the prox RCA   Diabetes mellitus without complication (HCC)    Erectile dysfunction    GERD (gastroesophageal reflux disease)    Hyperlipidemia    Hypertension    Nephrolithiasis    Dr. Aram Knights   Renal cyst    Right shoulder pain    rotator cuff   Skin cancer    Skin cancer, PMH of, cell type unknown, Dr. Del Favia   Past Surgical History:  Procedure Laterality Date   CATARACT EXTRACTION W/ INTRAOCULAR LENS IMPLANT Bilateral    colonoscopy with ploypectomy  03/05/2012    Dr Adan Holms, GI   CORONARY ANGIOPLASTY WITH STENT PLACEMENT     X 3 total stents   cystoscopic removal  04/04/2010   CYSTOSCOPY N/A 12/15/2023   Procedure: CYSTOSCOPY FLEXIBLE; URETHERAL DILATION; INSERTION OF FOLEY CATHETER;  Surgeon: Andrez Banker, MD;  Location: [facility];  Service: Urology;  Laterality: N/A;   Flexibe sigmoidoscopy   12/06/1999   LITHOTRIPSY     x 1   RECONSTRUCTION OF EYELID Left    TOTAL KNEE ARTHROPLASTY Left 12/15/2023   Procedure: LEFT TOTAL KNEE ARTHROPLASTY;  Surgeon: Arnie Lao, MD;  Location: [facility];   Service: Orthopedics;  Laterality: Left;   Patient Active Problem List   Diagnosis Date Noted   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/15/2023   Unilateral primary osteoarthritis, right hip 09/25/2023   Actinic keratosis 10/17/2018   Type 2 diabetes mellitus with other circulatory complications (HCC) 12/11/2017   Osteoarthritis, multiple sites 10/11/2017   Preventative health care 08/05/2015   Advance directive discussed with patient 08/05/2015   GERD (gastroesophageal reflux disease)    Hyperplastic colonic polyp 12/17/2013   Benign prostatic hyperplasia with urinary obstruction 01/16/2013   ED (erectile dysfunction) of organic origin 01/16/2013   Coronary atherosclerosis of native coronary artery 11/20/2009   NEPHROLITHIASIS 09/29/2009   RENAL CYST, RIGHT 09/29/2009   Hyperlipemia 09/16/2008   SKIN CANCER, HX OF 09/16/2008   Essential hypertension, benign 07/09/2007    PCP: Helaine Llanos, MD  REFERRING PROVIDER: Arnie Lao, MD  REFERRING DIAG:  [phone] (ICD-10-CM) - Unilateral primary osteoarthritis, left knee   THERAPY DIAG:  Stiffness of left knee, not elsewhere classified  Difficulty in walking, not elsewhere classified  Localized edema  Muscle weakness (generalized)  Acute pain of left knee  Rationale for Evaluation and Treatment: Rehabilitation  ONSET DATE: 12/15/23  SUBJECTIVE:   SUBJECTIVE  STATEMENT:   I really hurt my knee last week when the shoe stuck and my knee got bended too much, it really really hurt. Haven't seen the MD yet.   PERTINENT HISTORY: R hip OA  PAIN:  Are you having pain? No 0/10 now, can get to 6-7/10 after the bending incident   PRECAUTIONS: Fall  RED FLAGS: None   WEIGHT BEARING RESTRICTIONS: No  FALLS:  Has patient fallen in last 6 months? Yes. Number of falls 1 in September. Fell in a ditch trying to chainsaw a tree that fell in the street  LIVING ENVIRONMENT: Lives with: lives with their spouse,  Ninette Basque Lives in: House/apartment Stairs: Yes: External: 3 steps; on right going up Has following equipment at home: Otho Blitz - 2 wheeled  OCCUPATION: Retired - likes to fish  PLOF: Independent  PATIENT GOALS: Get back to fishing and normal activities  NEXT MD VISIT: 01/25/24  OBJECTIVE:  Note: Objective measures were completed at Evaluation unless otherwise noted.  DIAGNOSTIC FINDINGS: xray 12/15/23 IMPRESSION: Interval total left knee arthroplasty without evidence of hardware failure.  PATIENT SURVEYS:  Lower Extremity Functional Score: 26 / 80 = 32.5 %  COGNITION: Overall cognitive status: Within functional limits for tasks assessed     SENSATION: WFL  EDEMA:  L knee 50% bigger than R LE  MUSCLE LENGTH: Hamstrings: limited due to knee ROM on L Thomas test: Did not assess   LOWER EXTREMITY ROM:  Active ROM Right eval Left eval Left 01/04/24 Left 01/15/24  Hip flexion      Hip extension      Hip abduction      Hip adduction      Hip internal rotation      Hip external rotation      Knee flexion 125 68 90 87* AAROM, only able to get to 70* AROM due to pain   Knee extension -5 -35 -12 -6* AROM supine   Ankle dorsiflexion      Ankle plantarflexion      Ankle inversion      Ankle eversion       (Blank rows = not tested)  LOWER EXTREMITY MMT:  MMT Right eval Left eval  Hip flexion 4+ 3+  Hip extension    Hip abduction 4 4-  Hip adduction 5 5  Hip internal rotation    Hip external rotation    Knee flexion 5 3+  Knee extension 5 3- (limited due to ROM)  Ankle dorsiflexion    Ankle plantarflexion    Ankle inversion    Ankle eversion     (Blank rows = not tested)  FUNCTIONAL TESTS:  5 times sit to stand: L LE forward with bilat UE support; 23 sec Dynamic Gait Index: 14/24     GAIT: Distance walked: In clinic during DGI Assistive device utilized: Walker - 2 wheeled Level of assistance: Modified independence Comments: even step lengths bilat but  diminished knee flexion during swing phase and limited knee ext during stance phase  TREATMENT DATE:   01/15/24   Nustep L1 all four extremities for ROM, w/u, tissue perfusion   Knee flexion AAROM 12x3 second holds  LAQs 3# x15 LLE    Retrograde massage LEs in elevation for edema control  Percussion gun L quad with LEs elevated  Attempted knee extension overpressure- unable to tolerate due to knee pain       01/11/24 NuStep L 5 x 6 min L knee PROM   L tibiofemoral Jt mobs L knee flex & Ext stretch Quad sets LLE x 10 HS curls AROM LLE x10 Vaso L knee low pressure x10 minuted  01/08/24 NuStep L 5 x 6 min  L knee PROM   L tibiofemoral Jt mobs L knee flex & Ext stretch S2S 2x10 Leg Press 40lb 2x10, LLE 20lb 2x5 SAQ LLE 2lb 2x10 LLE 2lb SLR x10, w/ ER x10  01/04/24 Nustep L5 x 5 min LEs/UEs L foot on 4" step knee bends with PT overpressure for knee flexion x10 Forward step up on 4" step 2x10 Side step up on 4" step 2x10 Step down 4" step 2x10 Counter support squat 2x10 Squat to chair x5  Sitting heel slide x5 with overpressure x5" to get to 90 deg of flexion Sitting hamstring stretch 2x30" Sitting quad set 2x10 with PT overpressure Standing terminal knee ext against ball on wall 3x10 Amb with SPC x 2 laps around gym Backwards walking with SPC Side stepping with [facility]  01/01/24 Quad sets LLE L knee PROM  L tibiofemoral Jt mobs L knee flex & Ext stretch NuStep L 5 x6 min LAQ 2lb 2x10  S2S 2x10 w/ UE assist  Gait 150 ft decrease stance time on LLE, Decrease LLE TKE L knee Vaso Med x 10 min  12/28/23 See HEP below    PATIENT EDUCATION:  Education details: Exam findings, POC, initial HEP Person educated: Patient Education method: Explanation, Demonstration, and Handouts Education comprehension: verbalized understanding, returned  demonstration, and needs further education  HOME EXERCISE PROGRAM: Access Code: [email or web address] URL: [email or web address] Date: 01/04/2024 Prepared by: Gellen April Erman Hayward  Exercises - Standing Knee Flexion Stretch on Step  - 1 x daily - 7 x weekly - 2 sets - 10 reps - Long Sitting Quad Set with Towel Roll Under Heel  - 1 x daily - 7 x weekly - 2 sets - 10 reps - Supine Hamstring Stretch with Strap  - 1 x daily - 7 x weekly - 2 sets - 30 sec hold - Squat with Chair Touch  - 1 x daily - 7 x weekly - 3 sets - 10 reps - Step Up  - 1 x daily - 7 x weekly - 2 sets - 10 reps - Lateral Step Up  - 1 x daily - 7 x weekly - 2 sets - 10 reps - Forward Step Down Touch with Heel  - 1 x daily - 7 x weekly - 2 sets - 10 reps  ASSESSMENT:  CLINICAL IMPRESSION:   Pt arrives today still really having a lot of pain. We still took it easy today, focused on ROM work and strength as much as tolerated. Encouraged him to see MD if his pain does not start to improve in the next couple of days, but scheduled to see the surgeon for f/u appt 2/20.     OBJECTIVE IMPAIRMENTS: Abnormal gait, decreased activity tolerance, decreased balance, decreased endurance, decreased mobility, difficulty walking, decreased ROM, decreased strength, hypomobility, increased fascial restrictions, impaired flexibility, improper body  mechanics, postural dysfunction, and pain.   ACTIVITY LIMITATIONS: carrying, lifting, bending, sitting, standing, squatting, sleeping, stairs, transfers, bed mobility, bathing, toileting, dressing, hygiene/grooming, and locomotion level  PARTICIPATION LIMITATIONS: meal prep, cleaning, driving, shopping, community activity, and yard work  PERSONAL FACTORS: Age, Fitness, Past/current experiences, and Time since onset of injury/illness/exacerbation are also affecting patient's functional outcome.   REHAB POTENTIAL: Good  CLINICAL DECISION MAKING: Evolving/moderate complexity  EVALUATION  COMPLEXITY: Moderate   GOALS: Goals reviewed with patient? Yes   SHORT TERM GOALS: Target date: 01/25/2024   Independent with initial HEP. Baseline: Newly provided Goal status: Progressing 01/08/24  2.  Pt will be able to perform 5x STS without keeping L foot in front of R Baseline: Increased weight on R with L foot in front Goal status: INITIAL  3.  Pt will have improved knee ROM to 10-90 deg Baseline: 35-68 deg in sitting Goal status: INITIAL   LONG TERM GOALS: Target date:  02/22/2024   Independent with advanced/ongoing HEP to improve outcomes and carryover.  Baseline: Initial HEP provided Goal status: INITIAL  2.  Atlee Leach will demonstrate knee ROM from 5 to 120 deg to ascend/descend stairs. Baseline:  Goal status: INITIAL  3.  RASHIED SWITZER will be able to ambulate 1000' safely with LRAD and normal gait pattern to access community.  Baseline:  Goal status: INITIAL  4.  Atlee Leach will have improved LEFS to >/=42.5%  Baseline: 32.5% Goal status: INITIAL  5.  KASHUN CHATEL will demonstrate > 19/24 on DGI to demonstrate decreased risk of falls.   Baseline: 14/24 Goal status: INITIAL  6.  Atlee Leach will demonstrate improved functional LE strength by completing 5x STS in <15 seconds.  (MCID 5 seconds) Baseline: 23 sec Goal status: INITIAL   PLAN:  PT FREQUENCY: 2x/week  PT DURATION: 8 weeks  PLANNED INTERVENTIONS: 97164- PT Re-evaluation, 97110-Therapeutic exercises, 97530- Therapeutic activity, [id number]- Neuromuscular re-education, 97535- Self Care, 63016- Manual therapy, [id number]- Gait training, [phone]- Aquatic Therapy, 97014- Electrical stimulation (unattended), 97016- Vasopneumatic device, 97035- Ultrasound, 23557- Ionotophoresis 4mg /ml Dexamethasone , Patient/Family education, Balance training, Stair training, Taping, Dry Needling, Joint mobilization, Spinal mobilization, Scar mobilization, DME instructions, Cryotherapy, and Moist heat  PLAN FOR  NEXT SESSION: Progress knee ROM as tolerated. L LE strengthening. Is his knee any better?   Terrel Ferries, PT, DPT 01/15/24 2:27 PM

## 2024-01-18 ENCOUNTER — Encounter: Payer: Self-pay | Admitting: Physical Therapy

## 2024-01-18 ENCOUNTER — Ambulatory Visit: Payer: Medicare Other | Admitting: Physical Therapy

## 2024-01-18 DIAGNOSIS — M25562 Pain in left knee: Secondary | ICD-10-CM | POA: Diagnosis not present

## 2024-01-18 DIAGNOSIS — R600 Localized edema: Secondary | ICD-10-CM

## 2024-01-18 DIAGNOSIS — M25662 Stiffness of left knee, not elsewhere classified: Secondary | ICD-10-CM | POA: Diagnosis not present

## 2024-01-18 DIAGNOSIS — R262 Difficulty in walking, not elsewhere classified: Secondary | ICD-10-CM

## 2024-01-18 DIAGNOSIS — M6281 Muscle weakness (generalized): Secondary | ICD-10-CM

## 2024-01-18 NOTE — Therapy (Signed)
 OUTPATIENT PHYSICAL THERAPY LOWER EXTREMITY TREATMENT   Patient Name: Peter Blair MRN: [id number] DOB:12-25-1940, 83 y.o., male Today's Date: 01/18/2024  END OF SESSION:  PT End of Session - 01/18/24 1300     Visit Number 7    Date for PT Re-Evaluation 02/22/24    PT Start Time 1300    PT Stop Time 1345    PT Time Calculation (min) 45 min    Activity Tolerance Patient tolerated treatment well;Patient limited by pain    Behavior During Therapy [facility] for tasks assessed/performed               Past Medical History:  Diagnosis Date   Coronary artery disease    exertional chest pain, cath 12/10 showed EF 50-55% with mild inferior hypokensis, 99% proximal and 60% distal RCA stenosis, 80% prox stenosis of a moderate sized D1, 40% prox LAD. Patient had BMS x2 placed in RCA. Unstable Angina. PROMUS DES to the prox RCA   Diabetes mellitus without complication (HCC)    Erectile dysfunction    GERD (gastroesophageal reflux disease)    Hyperlipidemia    Hypertension    Nephrolithiasis    Dr. Achilles Dunk   Renal cyst    Right shoulder pain    rotator cuff   Skin cancer    Skin cancer, PMH of, cell type unknown, Dr. Margo Aye   Past Surgical History:  Procedure Laterality Date   CATARACT EXTRACTION W/ INTRAOCULAR LENS IMPLANT Bilateral    colonoscopy with ploypectomy  03/05/2012    Dr Jarold Motto, GI   CORONARY ANGIOPLASTY WITH STENT PLACEMENT     X 3 total stents   cystoscopic removal  04/04/2010   CYSTOSCOPY N/A 12/15/2023   Procedure: CYSTOSCOPY FLEXIBLE; URETHERAL DILATION; INSERTION OF FOLEY CATHETER;  Surgeon: [name], MD;  Location: [facility];  Service: Urology;  Laterality: N/A;   Flexibe sigmoidoscopy   12/06/1999   LITHOTRIPSY     x 1   RECONSTRUCTION OF EYELID Left    TOTAL KNEE ARTHROPLASTY Left 12/15/2023   Procedure: LEFT TOTAL KNEE ARTHROPLASTY;  Surgeon: [name], MD;  Location: [facility];  Service: Orthopedics;  Laterality: Left;   Patient Active  Problem List   Diagnosis Date Noted   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/15/2023   Unilateral primary osteoarthritis, right hip 09/25/2023   Actinic keratosis 10/17/2018   Type 2 diabetes mellitus with other circulatory complications (HCC) 12/11/2017   Osteoarthritis, multiple sites 10/11/2017   Preventative health care 08/05/2015   Advance directive discussed with patient 08/05/2015   GERD (gastroesophageal reflux disease)    Hyperplastic colonic polyp 12/17/2013   Benign prostatic hyperplasia with urinary obstruction 01/16/2013   ED (erectile dysfunction) of organic origin 01/16/2013   Coronary atherosclerosis of native coronary artery 11/20/2009   NEPHROLITHIASIS 09/29/2009   RENAL CYST, RIGHT 09/29/2009   Hyperlipemia 09/16/2008   SKIN CANCER, HX OF 09/16/2008   Essential hypertension, benign 07/09/2007    PCP: [name], MD  REFERRING PROVIDER: [name], MD  REFERRING DIAG:  [phone] (ICD-10-CM) - Unilateral primary osteoarthritis, left knee   THERAPY DIAG:  Stiffness of left knee, not elsewhere classified  Difficulty in walking, not elsewhere classified  Muscle weakness (generalized)  Localized edema  Acute pain of left knee  Rationale for Evaluation and Treatment: Rehabilitation  ONSET DATE: 12/15/23  SUBJECTIVE:   SUBJECTIVE STATEMENT:  A lot better than I was Monday   PERTINENT HISTORY: R hip OA  PAIN:  Are  you having pain? No 2/10  L knee  PRECAUTIONS: Fall  RED FLAGS: None   WEIGHT BEARING RESTRICTIONS: No  FALLS:  Has patient fallen in last 6 months? Yes. Number of falls 1 in September. Fell in a ditch trying to chainsaw a tree that fell in the street  LIVING ENVIRONMENT: Lives with: lives with their spouse, Peter Blair Lives in: House/apartment Stairs: Yes: External: 3 steps; on right going up Has following equipment at home: [name] - 2 wheeled  OCCUPATION: Retired - likes to fish  PLOF:  Independent  PATIENT GOALS: Get back to fishing and normal activities  NEXT MD VISIT: 01/25/24  OBJECTIVE:  Note: Objective measures were completed at Evaluation unless otherwise noted.  DIAGNOSTIC FINDINGS: xray 12/15/23 IMPRESSION: Interval total left knee arthroplasty without evidence of hardware failure.  PATIENT SURVEYS:  Lower Extremity Functional Score: 26 / 80 = 32.5 %  COGNITION: Overall cognitive status: Within functional limits for tasks assessed     SENSATION: WFL  EDEMA:  L knee 50% bigger than R LE  MUSCLE LENGTH: Hamstrings: limited due to knee ROM on L Thomas test: Did not assess   LOWER EXTREMITY ROM:  Active ROM Right eval Left eval Left 01/04/24 Left 01/15/24  Hip flexion      Hip extension      Hip abduction      Hip adduction      Hip internal rotation      Hip external rotation      Knee flexion 125 68 90 87* AAROM, only able to get to 70* AROM due to pain   Knee extension -5 -35 -12 -6* AROM supine   Ankle dorsiflexion      Ankle plantarflexion      Ankle inversion      Ankle eversion       (Blank rows = not tested)  LOWER EXTREMITY MMT:  MMT Right eval Left eval  Hip flexion 4+ 3+  Hip extension    Hip abduction 4 4-  Hip adduction 5 5  Hip internal rotation    Hip external rotation    Knee flexion 5 3+  Knee extension 5 3- (limited due to ROM)  Ankle dorsiflexion    Ankle plantarflexion    Ankle inversion    Ankle eversion     (Blank rows = not tested)  FUNCTIONAL TESTS:  5 times sit to stand: L LE forward with bilat UE support; 23 sec Dynamic Gait Index: 14/24     GAIT: Distance walked: In clinic during DGI Assistive device utilized: Walker - 2 wheeled Level of assistance: Modified independence Comments: even step lengths bilat but diminished knee flexion during swing phase and limited knee ext during stance phase                                                                                                                                 TREATMENT DATE:  01/18/24 NuStep L  5 x 6 min HS curls 25lb 2x10 Leg Ext 5lb 2x10 Leg press 30lb 2x10 L knee PROM   L tibiofemoral Jt mobs L knee flex & Ext stretch LLE LAQ 3lb 2x10 LLE HS curls green 2x10 Slant board calf stretch   01/15/24   Nustep L1 all four extremities for ROM, w/u, tissue perfusion   Knee flexion AAROM 12x3 second holds  LAQs 3# x15 LLE    Retrograde massage LEs in elevation for edema control  Percussion gun L quad with LEs elevated  Attempted knee extension overpressure- unable to tolerate due to knee pain    01/11/24 NuStep L 5 x 6 min L knee PROM   L tibiofemoral Jt mobs L knee flex & Ext stretch Quad sets LLE x 10 HS curls AROM LLE x10 Vaso L knee low pressure x10 minuted  01/08/24 NuStep L 5 x 6 min  L knee PROM   L tibiofemoral Jt mobs L knee flex & Ext stretch S2S 2x10 Leg Press 40lb 2x10, LLE 20lb 2x5 SAQ LLE 2lb 2x10 LLE 2lb SLR x10, w/ ER x10  01/04/24 Nustep L5 x 5 min LEs/UEs L foot on 4" step knee bends with PT overpressure for knee flexion x10 Forward step up on 4" step 2x10 Side step up on 4" step 2x10 Step down 4" step 2x10 Counter support squat 2x10 Squat to chair x5  Sitting heel slide x5 with overpressure x5" to get to 90 deg of flexion Sitting hamstring stretch 2x30" Sitting quad set 2x10 with PT overpressure Standing terminal knee ext against ball on wall 3x10 Amb with SPC x 2 laps around gym Backwards walking with SPC Side stepping with [facility]  01/01/24 Quad sets LLE L knee PROM  L tibiofemoral Jt mobs L knee flex & Ext stretch NuStep L 5 x6 min LAQ 2lb 2x10  S2S 2x10 w/ UE assist  Gait 150 ft decrease stance time on LLE, Decrease LLE TKE L knee Vaso Med x 10 min  12/28/23 See HEP below    PATIENT EDUCATION:  Education details: Exam findings, POC, initial HEP Person educated: Patient Education method: Explanation, Demonstration, and Handouts Education comprehension:  verbalized understanding, returned demonstration, and needs further education  HOME EXERCISE PROGRAM: Access Code: [email or web address] URL: [email or web address] Date: 01/04/2024 Prepared by: Peter Blair  Exercises - Standing Knee Flexion Stretch on Step  - 1 x daily - 7 x weekly - 2 sets - 10 reps - Long Sitting Quad Set with Towel Roll Under Heel  - 1 x daily - 7 x weekly - 2 sets - 10 reps - Supine Hamstring Stretch with Strap  - 1 x daily - 7 x weekly - 2 sets - 30 sec hold - Squat with Chair Touch  - 1 x daily - 7 x weekly - 3 sets - 10 reps - Step Up  - 1 x daily - 7 x weekly - 2 sets - 10 reps - Lateral Step Up  - 1 x daily - 7 x weekly - 2 sets - 10 reps - Forward Step Down Touch with Heel  - 1 x daily - 7 x weekly - 2 sets - 10 reps  ASSESSMENT:  CLINICAL IMPRESSION:  Pt enters feeling better overall. Pt enters ambulating without AD. All resistance kept to a minium due to recent episodes. Cues to hold contraction with Leg curls and Extensions. No reports of increase pain during session. Pt would benefit from skilled PT serviced to increase L knee strength  and ROM for better quality of life.    OBJECTIVE IMPAIRMENTS: Abnormal gait, decreased activity tolerance, decreased balance, decreased endurance, decreased mobility, difficulty walking, decreased ROM, decreased strength, hypomobility, increased fascial restrictions, impaired flexibility, improper body mechanics, postural dysfunction, and pain.   ACTIVITY LIMITATIONS: carrying, lifting, bending, sitting, standing, squatting, sleeping, stairs, transfers, bed mobility, bathing, toileting, dressing, hygiene/grooming, and locomotion level  PARTICIPATION LIMITATIONS: meal prep, cleaning, driving, shopping, community activity, and yard work  PERSONAL FACTORS: Age, Fitness, Past/current experiences, and Time since onset of injury/illness/exacerbation are also affecting patient's functional outcome.   REHAB  POTENTIAL: Good  CLINICAL DECISION MAKING: Evolving/moderate complexity  EVALUATION COMPLEXITY: Moderate   GOALS: Goals reviewed with patient? Yes   SHORT TERM GOALS: Target date: 01/25/2024   Independent with initial HEP. Baseline: Newly provided Goal status: Progressing 01/08/24  2.  Pt will be able to perform 5x STS without keeping L foot in front of R Baseline: Increased weight on R with L foot in front Goal status: INITIAL  3.  Pt will have improved knee ROM to 10-90 deg Baseline: 35-68 deg in sitting Goal status: INITIAL   LONG TERM GOALS: Target date:  02/22/2024   Independent with advanced/ongoing HEP to improve outcomes and carryover.  Baseline: Initial HEP provided Goal status: INITIAL  2.  Peter Blair will demonstrate knee ROM from 5 to 120 deg to ascend/descend stairs. Baseline:  Goal status: INITIAL  3.  Peter Blair will be able to ambulate 1000' safely with LRAD and normal gait pattern to access community.  Baseline:  Goal status: INITIAL  4.  Peter Blair will have improved LEFS to >/=42.5%  Baseline: 32.5% Goal status: INITIAL  5.  Peter Blair will demonstrate > 19/24 on DGI to demonstrate decreased risk of falls.   Baseline: 14/24 Goal status: INITIAL  6.  Peter Blair will demonstrate improved functional LE strength by completing 5x STS in <15 seconds.  (MCID 5 seconds) Baseline: 23 sec Goal status: INITIAL   PLAN:  PT FREQUENCY: 2x/week  PT DURATION: 8 weeks  PLANNED INTERVENTIONS: 97164- PT Re-evaluation, 97110-Therapeutic exercises, 97530- Therapeutic activity, [id number]- Neuromuscular re-education, 97535- Self Care, [id number]- Manual therapy, [id number]- Gait training, [phone]- Aquatic Therapy, 97014- Electrical stimulation (unattended), 97016- Vasopneumatic device, [id number]- Ultrasound, 86578- Ionotophoresis 4mg /ml Dexamethasone, Patient/Family education, Balance training, Stair training, Taping, Dry Needling, Joint mobilization, Spinal  mobilization, Scar mobilization, DME instructions, Cryotherapy, and Moist heat  PLAN FOR NEXT SESSION: Progress knee ROM as tolerated. L LE strengthening. Is his knee any better?   Nedra Hai, PT, DPT 01/18/24 1:03 PM

## 2024-01-22 ENCOUNTER — Ambulatory Visit: Payer: Medicare Other | Admitting: Physical Therapy

## 2024-01-22 ENCOUNTER — Encounter: Payer: Self-pay | Admitting: Physical Therapy

## 2024-01-22 DIAGNOSIS — R600 Localized edema: Secondary | ICD-10-CM | POA: Diagnosis not present

## 2024-01-22 DIAGNOSIS — R262 Difficulty in walking, not elsewhere classified: Secondary | ICD-10-CM

## 2024-01-22 DIAGNOSIS — M25562 Pain in left knee: Secondary | ICD-10-CM | POA: Diagnosis not present

## 2024-01-22 DIAGNOSIS — M6281 Muscle weakness (generalized): Secondary | ICD-10-CM

## 2024-01-22 DIAGNOSIS — M25662 Stiffness of left knee, not elsewhere classified: Secondary | ICD-10-CM

## 2024-01-22 NOTE — Therapy (Signed)
 OUTPATIENT PHYSICAL THERAPY LOWER EXTREMITY TREATMENT   Patient Name: Peter Blair MRN: [id number] DOB:1941/05/02, 83 y.o., male Today's Date: 01/22/2024  END OF SESSION:  PT End of Session - 01/22/24 1301     Visit Number 8    Date for PT Re-Evaluation 02/22/24    PT Start Time 1300    PT Stop Time 1345    PT Time Calculation (min) 45 min    Activity Tolerance Patient tolerated treatment well    Behavior During Therapy [facility] for tasks assessed/performed               Past Medical History:  Diagnosis Date   Coronary artery disease    exertional chest pain, cath 12/10 showed EF 50-55% with mild inferior hypokensis, 99% proximal and 60% distal RCA stenosis, 80% prox stenosis of a moderate sized D1, 40% prox LAD. Patient had BMS x2 placed in RCA. Unstable Angina. PROMUS DES to the prox RCA   Diabetes mellitus without complication (HCC)    Erectile dysfunction    GERD (gastroesophageal reflux disease)    Hyperlipidemia    Hypertension    Nephrolithiasis    Dr. Achilles Dunk   Renal cyst    Right shoulder pain    rotator cuff   Skin cancer    Skin cancer, PMH of, cell type unknown, Dr. Margo Aye   Past Surgical History:  Procedure Laterality Date   CATARACT EXTRACTION W/ INTRAOCULAR LENS IMPLANT Bilateral    colonoscopy with ploypectomy  03/05/2012    Dr Jarold Motto, GI   CORONARY ANGIOPLASTY WITH STENT PLACEMENT     X 3 total stents   cystoscopic removal  04/04/2010   CYSTOSCOPY N/A 12/15/2023   Procedure: CYSTOSCOPY FLEXIBLE; URETHERAL DILATION; INSERTION OF FOLEY CATHETER;  Surgeon: [name], MD;  Location: [facility];  Service: Urology;  Laterality: N/A;   Flexibe sigmoidoscopy   12/06/1999   LITHOTRIPSY     x 1   RECONSTRUCTION OF EYELID Left    TOTAL KNEE ARTHROPLASTY Left 12/15/2023   Procedure: LEFT TOTAL KNEE ARTHROPLASTY;  Surgeon: [name], MD;  Location: [facility];  Service: Orthopedics;  Laterality: Left;   Patient Active Problem List   Diagnosis  Date Noted   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/15/2023   Unilateral primary osteoarthritis, right hip 09/25/2023   Actinic keratosis 10/17/2018   Type 2 diabetes mellitus with other circulatory complications (HCC) 12/11/2017   Osteoarthritis, multiple sites 10/11/2017   Preventative health care 08/05/2015   Advance directive discussed with patient 08/05/2015   GERD (gastroesophageal reflux disease)    Hyperplastic colonic polyp 12/17/2013   Benign prostatic hyperplasia with urinary obstruction 01/16/2013   ED (erectile dysfunction) of organic origin 01/16/2013   Coronary atherosclerosis of native coronary artery 11/20/2009   NEPHROLITHIASIS 09/29/2009   RENAL CYST, RIGHT 09/29/2009   Hyperlipemia 09/16/2008   SKIN CANCER, HX OF 09/16/2008   Essential hypertension, benign 07/09/2007    PCP: [name], MD  REFERRING PROVIDER: [name], MD  REFERRING DIAG:  [phone] (ICD-10-CM) - Unilateral primary osteoarthritis, left knee   THERAPY DIAG:  Stiffness of left knee, not elsewhere classified  Muscle weakness (generalized)  Localized edema  Difficulty in walking, not elsewhere classified  Acute pain of left knee  Rationale for Evaluation and Treatment: Rehabilitation  ONSET DATE: 12/15/23  SUBJECTIVE:   SUBJECTIVE STATEMENT:  About the same, difficulty sleeping, some pain   PERTINENT HISTORY: R hip OA  PAIN:  Are you having pain?  No 2/10  L knee  PRECAUTIONS: Fall  RED FLAGS: None   WEIGHT BEARING RESTRICTIONS: No  FALLS:  Has patient fallen in last 6 months? Yes. Number of falls 1 in September. Fell in a ditch trying to chainsaw a tree that fell in the street  LIVING ENVIRONMENT: Lives with: lives with their spouse, [name] Lives in: House/apartment Stairs: Yes: External: 3 steps; on right going up Has following equipment at home: [name] - 2 wheeled  OCCUPATION: Retired - likes to fish  PLOF: Independent  PATIENT GOALS:  Get back to fishing and normal activities  NEXT MD VISIT: 01/25/24  OBJECTIVE:  Note: Objective measures were completed at Evaluation unless otherwise noted.  DIAGNOSTIC FINDINGS: xray 12/15/23 IMPRESSION: Interval total left knee arthroplasty without evidence of hardware failure.  PATIENT SURVEYS:  Lower Extremity Functional Score: 26 / 80 = 32.5 %  COGNITION: Overall cognitive status: Within functional limits for tasks assessed     SENSATION: WFL  EDEMA:  L knee 50% bigger than R LE  MUSCLE LENGTH: Hamstrings: limited due to knee ROM on L Thomas test: Did not assess   LOWER EXTREMITY ROM:  Active ROM Right eval Left eval Left 01/04/24 Left 01/15/24  Hip flexion      Hip extension      Hip abduction      Hip adduction      Hip internal rotation      Hip external rotation      Knee flexion 125 68 90 87* AAROM, only able to get to 70* AROM due to pain   Knee extension -5 -35 -12 -6* AROM supine   Ankle dorsiflexion      Ankle plantarflexion      Ankle inversion      Ankle eversion       (Blank rows = not tested)  LOWER EXTREMITY MMT:  MMT Right eval Left eval  Hip flexion 4+ 3+  Hip extension    Hip abduction 4 4-  Hip adduction 5 5  Hip internal rotation    Hip external rotation    Knee flexion 5 3+  Knee extension 5 3- (limited due to ROM)  Ankle dorsiflexion    Ankle plantarflexion    Ankle inversion    Ankle eversion     (Blank rows = not tested)  FUNCTIONAL TESTS:  5 times sit to stand: L LE forward with bilat UE support; 23 sec Dynamic Gait Index: 14/24     GAIT: Distance walked: In clinic during DGI Assistive device utilized: Walker - 2 wheeled Level of assistance: Modified independence Comments: even step lengths bilat but diminished knee flexion during swing phase and limited knee ext during stance phase                                                                                                                                 TREATMENT DATE:  01/22/24 Bike L3 partial revolutions x  4 min NuStep L5 x4 min LE only Resisted gait 30lb 4 way x 3 each 6in step ups x10 each  Hs curls 25lb 2x10 Leg ext 10lb 2x10 S2S holding blue ball 2x10   L knee PROM   L tibiofemoral Jt mobs L knee flex & Ext stretch L knee contract relax for flex  01/18/24 NuStep L 5 x 6 min HS curls 25lb 2x10 Leg Ext 5lb 2x10 Leg press 30lb 2x10 L knee PROM   L tibiofemoral Jt mobs L knee flex & Ext stretch LLE LAQ 3lb 2x10 LLE HS curls green 2x10 Slant board calf stretch   01/15/24   Nustep L1 all four extremities for ROM, w/u, tissue perfusion   Knee flexion AAROM 12x3 second holds  LAQs 3# x15 LLE    Retrograde massage LEs in elevation for edema control  Percussion gun L quad with LEs elevated  Attempted knee extension overpressure- unable to tolerate due to knee pain    01/11/24 NuStep L 5 x 6 min L knee PROM   L tibiofemoral Jt mobs L knee flex & Ext stretch Quad sets LLE x 10 HS curls AROM LLE x10 Vaso L knee low pressure x10 minuted  01/08/24 NuStep L 5 x 6 min  L knee PROM   L tibiofemoral Jt mobs L knee flex & Ext stretch S2S 2x10 Leg Press 40lb 2x10, LLE 20lb 2x5 SAQ LLE 2lb 2x10 LLE 2lb SLR x10, w/ ER x10  01/04/24 Nustep L5 x 5 min LEs/UEs L foot on 4" step knee bends with PT overpressure for knee flexion x10 Forward step up on 4" step 2x10 Side step up on 4" step 2x10 Step down 4" step 2x10 Counter support squat 2x10 Squat to chair x5  Sitting heel slide x5 with overpressure x5" to get to 90 deg of flexion Sitting hamstring stretch 2x30" Sitting quad set 2x10 with PT overpressure Standing terminal knee ext against ball on wall 3x10 Amb with SPC x 2 laps around gym Backwards walking with SPC Side stepping with [facility]  01/01/24 Quad sets LLE L knee PROM  L tibiofemoral Jt mobs L knee flex & Ext stretch NuStep L 5 x6 min LAQ 2lb 2x10  S2S 2x10 w/ UE assist  Gait 150 ft decrease stance time  on LLE, Decrease LLE TKE L knee Vaso Med x 10 min  12/28/23 See HEP below    PATIENT EDUCATION:  Education details: Exam findings, POC, initial HEP Person educated: Patient Education method: Explanation, Demonstration, and Handouts Education comprehension: verbalized understanding, returned demonstration, and needs further education  HOME EXERCISE PROGRAM: Access Code: [email or web address] URL: [email or web address] Date: 01/04/2024 Prepared by: Vernon Prey April Kirstie Peri  Exercises - Standing Knee Flexion Stretch on Step  - 1 x daily - 7 x weekly - 2 sets - 10 reps - Long Sitting Quad Set with Towel Roll Under Heel  - 1 x daily - 7 x weekly - 2 sets - 10 reps - Supine Hamstring Stretch with Strap  - 1 x daily - 7 x weekly - 2 sets - 30 sec hold - Squat with Chair Touch  - 1 x daily - 7 x weekly - 3 sets - 10 reps - Step Up  - 1 x daily - 7 x weekly - 2 sets - 10 reps - Lateral Step Up  - 1 x daily - 7 x weekly - 2 sets - 10 reps - Forward Step Down Touch with Heel  - 1 x daily - 7  x weekly - 2 sets - 10 reps  ASSESSMENT:  CLINICAL IMPRESSION:  Pt enters doing well  Again enters ambulating without AD. Added more functional interventions without issue. Cues for eccentric control needed with backwards resisted walking. Pain reported with end range L knee flexion and extension. Cues needed to bring LLE back with sit to stands. Pt would benefit from skilled PT serviced to increase L knee strength and ROM for better quality of life.    OBJECTIVE IMPAIRMENTS: Abnormal gait, decreased activity tolerance, decreased balance, decreased endurance, decreased mobility, difficulty walking, decreased ROM, decreased strength, hypomobility, increased fascial restrictions, impaired flexibility, improper body mechanics, postural dysfunction, and pain.   ACTIVITY LIMITATIONS: carrying, lifting, bending, sitting, standing, squatting, sleeping, stairs, transfers, bed mobility, bathing, toileting,  dressing, hygiene/grooming, and locomotion level  PARTICIPATION LIMITATIONS: meal prep, cleaning, driving, shopping, community activity, and yard work  PERSONAL FACTORS: Age, Fitness, Past/current experiences, and Time since onset of injury/illness/exacerbation are also affecting patient's functional outcome.   REHAB POTENTIAL: Good  CLINICAL DECISION MAKING: Evolving/moderate complexity  EVALUATION COMPLEXITY: Moderate   GOALS: Goals reviewed with patient? Yes   SHORT TERM GOALS: Target date: 01/25/2024   Independent with initial HEP. Baseline: Newly provided Goal status: Progressing 01/08/24  2.  Pt will be able to perform 5x STS without keeping L foot in front of R Baseline: Increased weight on R with L foot in front Goal status: Progressing 01/22/24  3.  Pt will have improved knee ROM to 10-90 deg Baseline: 35-68 deg in sitting Goal status: INITIAL   LONG TERM GOALS: Target date:  02/22/2024   Independent with advanced/ongoing HEP to improve outcomes and carryover.  Baseline: Initial HEP provided Goal status: INITIAL  2.  Peter Blair will demonstrate knee ROM from 5 to 120 deg to ascend/descend stairs. Baseline:  Goal status: INITIAL  3.  Peter Blair will be able to ambulate 1000' safely with LRAD and normal gait pattern to access community.  Baseline:  Goal status: INITIAL  4.  Peter Blair will have improved LEFS to >/=42.5%  Baseline: 32.5% Goal status: INITIAL  5.  Peter Blair will demonstrate > 19/24 on DGI to demonstrate decreased risk of falls.   Baseline: 14/24 Goal status: INITIAL  6.  Peter Blair will demonstrate improved functional LE strength by completing 5x STS in <15 seconds.  (MCID 5 seconds) Baseline: 23 sec Goal status: INITIAL   PLAN:  PT FREQUENCY: 2x/week  PT DURATION: 8 weeks  PLANNED INTERVENTIONS: 97164- PT Re-evaluation, 97110-Therapeutic exercises, 97530- Therapeutic activity, [id number]- Neuromuscular re-education,  97535- Self Care, 78295- Manual therapy, [id number]- Gait training, [phone]- Aquatic Therapy, 97014- Electrical stimulation (unattended), 97016- Vasopneumatic device, [id number]- Ultrasound, 86578- Ionotophoresis 4mg /ml Dexamethasone, Patient/Family education, Balance training, Stair training, Taping, Dry Needling, Joint mobilization, Spinal mobilization, Scar mobilization, DME instructions, Cryotherapy, and Moist heat  PLAN FOR NEXT SESSION: Progress knee ROM as tolerated. L LE strengthening. Is his knee any better?   Debroah Baller, PTA 01/22/24 1:02 PM

## 2024-01-25 ENCOUNTER — Encounter: Payer: Self-pay | Admitting: Orthopaedic Surgery

## 2024-01-25 ENCOUNTER — Ambulatory Visit: Payer: Medicare Other | Admitting: Physical Therapy

## 2024-01-25 ENCOUNTER — Ambulatory Visit (INDEPENDENT_AMBULATORY_CARE_PROVIDER_SITE_OTHER): Payer: Medicare Other | Admitting: Orthopaedic Surgery

## 2024-01-25 DIAGNOSIS — Z96652 Presence of left artificial knee joint: Secondary | ICD-10-CM

## 2024-01-25 MED ORDER — GABAPENTIN 600 MG PO TABS: 600.0000 mg | ORAL_TABLET | Freq: Every day | ORAL | 0 refills | Status: DC

## 2024-01-25 MED ORDER — HYDROCODONE-ACETAMINOPHEN 5-325 MG PO TABS: 1.0000 | ORAL_TABLET | Freq: Four times a day (QID) | ORAL | 0 refills | Status: DC | PRN

## 2024-01-25 NOTE — Progress Notes (Signed)
 The patient is now 6 weeks status post a left total knee replacement.  He is 83 years old.  He says he still having trouble sleeping at night.  Examination of his left knee today shows he lacks full extension by at least 3 degrees to almost 5 degrees.  However, I can flex him to well past 90 degrees.  The knee feels stable on exam.  There is swelling to be expected.  He has taken gabapentin at night so I will send in some more gabapentin.  Will wean him from oxycodone to hydrocodone.  He will continue therapy as well.  I like to see him back in a month to see how he is doing overall but no x-rays are needed.

## 2024-01-30 ENCOUNTER — Ambulatory Visit: Payer: Medicare Other | Admitting: Physical Therapy

## 2024-01-30 DIAGNOSIS — R262 Difficulty in walking, not elsewhere classified: Secondary | ICD-10-CM | POA: Diagnosis not present

## 2024-01-30 DIAGNOSIS — M25662 Stiffness of left knee, not elsewhere classified: Secondary | ICD-10-CM | POA: Diagnosis not present

## 2024-01-30 DIAGNOSIS — M25562 Pain in left knee: Secondary | ICD-10-CM | POA: Diagnosis not present

## 2024-01-30 DIAGNOSIS — M6281 Muscle weakness (generalized): Secondary | ICD-10-CM | POA: Diagnosis not present

## 2024-01-30 DIAGNOSIS — R600 Localized edema: Secondary | ICD-10-CM | POA: Diagnosis not present

## 2024-01-30 NOTE — Therapy (Signed)
 OUTPATIENT PHYSICAL THERAPY LOWER EXTREMITY TREATMENT   Patient Name: Peter Blair MRN: [id number] DOB:January 25, 1941, 83 y.o., male Today's Date: 01/30/2024  END OF SESSION:  PT End of Session - 01/30/24 1056     Visit Number 9    Number of Visits 17    Date for PT Re-Evaluation 02/22/24    Authorization Type [employer] Medicare    PT Start Time 1055    PT Stop Time 1140    PT Time Calculation (min) 45 min               Past Medical History:  Diagnosis Date   Coronary artery disease    exertional chest pain, cath 12/10 showed EF 50-55% with mild inferior hypokensis, 99% proximal and 60% distal RCA stenosis, 80% prox stenosis of a moderate sized D1, 40% prox LAD. Patient had BMS x2 placed in RCA. Unstable Angina. PROMUS DES to the prox RCA   Diabetes mellitus without complication (HCC)    Erectile dysfunction    GERD (gastroesophageal reflux disease)    Hyperlipidemia    Hypertension    Nephrolithiasis    Dr. Achilles Dunk   Renal cyst    Right shoulder pain    rotator cuff   Skin cancer    Skin cancer, PMH of, cell type unknown, Dr. Margo Aye   Past Surgical History:  Procedure Laterality Date   CATARACT EXTRACTION W/ INTRAOCULAR LENS IMPLANT Bilateral    colonoscopy with ploypectomy  03/05/2012    Dr Jarold Motto, GI   CORONARY ANGIOPLASTY WITH STENT PLACEMENT     X 3 total stents   cystoscopic removal  04/04/2010   CYSTOSCOPY N/A 12/15/2023   Procedure: CYSTOSCOPY FLEXIBLE; URETHERAL DILATION; INSERTION OF FOLEY CATHETER;  Surgeon: [name], MD;  Location: [facility];  Service: Urology;  Laterality: N/A;   Flexibe sigmoidoscopy   12/06/1999   LITHOTRIPSY     x 1   RECONSTRUCTION OF EYELID Left    TOTAL KNEE ARTHROPLASTY Left 12/15/2023   Procedure: LEFT TOTAL KNEE ARTHROPLASTY;  Surgeon: [name], MD;  Location: [facility];  Service: Orthopedics;  Laterality: Left;   Patient Active Problem List   Diagnosis Date Noted   Status post total left knee replacement  12/15/2023   Unilateral primary osteoarthritis, right hip 09/25/2023   Actinic keratosis 10/17/2018   Type 2 diabetes mellitus with other circulatory complications (HCC) 12/11/2017   Osteoarthritis, multiple sites 10/11/2017   Preventative health care 08/05/2015   Advance directive discussed with patient 08/05/2015   GERD (gastroesophageal reflux disease)    Hyperplastic colonic polyp 12/17/2013   Benign prostatic hyperplasia with urinary obstruction 01/16/2013   ED (erectile dysfunction) of organic origin 01/16/2013   Coronary atherosclerosis of native coronary artery 11/20/2009   NEPHROLITHIASIS 09/29/2009   RENAL CYST, RIGHT 09/29/2009   Hyperlipemia 09/16/2008   SKIN CANCER, HX OF 09/16/2008   Essential hypertension, benign 07/09/2007    PCP: [name], MD  REFERRING PROVIDER: [name], MD  REFERRING DIAG:  [phone] (ICD-10-CM) - Unilateral primary osteoarthritis, left knee   THERAPY DIAG:  Stiffness of left knee, not elsewhere classified  Muscle weakness (generalized)  Rationale for Evaluation and Treatment: Rehabilitation  ONSET DATE: 12/15/23  SUBJECTIVE:   SUBJECTIVE STATEMENT:  Saw MD and he was pleased. Trying to decrease pain meds ,still painful Takes a couple days to recover form PT sessions   PERTINENT HISTORY: R hip OA  PAIN:  Are you having pain? No 2/10  L knee  PRECAUTIONS: Fall  RED FLAGS: None   WEIGHT BEARING RESTRICTIONS: No  FALLS:  Has patient fallen in last 6 months? Yes. Number of falls 1 in September. Fell in a ditch trying to chainsaw a tree that fell in the street  LIVING ENVIRONMENT: Lives with: lives with their spouse, Peter Blair Lives in: House/apartment Stairs: Yes: External: 3 steps; on right going up Has following equipment at home: [name] - 2 wheeled  OCCUPATION: Retired - likes to fish  PLOF: Independent  PATIENT GOALS: Get back to fishing and normal activities  NEXT MD VISIT:  01/25/24  OBJECTIVE:  Note: Objective measures were completed at Evaluation unless otherwise noted.  DIAGNOSTIC FINDINGS: xray 12/15/23 IMPRESSION: Interval total left knee arthroplasty without evidence of hardware failure.  PATIENT SURVEYS:  Lower Extremity Functional Score: 26 / 80 = 32.5 %  COGNITION: Overall cognitive status: Within functional limits for tasks assessed     SENSATION: WFL  EDEMA:  L knee 50% bigger than R LE  MUSCLE LENGTH: Hamstrings: limited due to knee ROM on L Thomas test: Did not assess   LOWER EXTREMITY ROM:  Active ROM Right eval Left eval Left 01/04/24 Left 01/15/24  Hip flexion      Hip extension      Hip abduction      Hip adduction      Hip internal rotation      Hip external rotation      Knee flexion 125 68 90 87* AAROM, only able to get to 70* AROM due to pain   Knee extension -5 -35 -12 -6* AROM supine   Ankle dorsiflexion      Ankle plantarflexion      Ankle inversion      Ankle eversion       (Blank rows = not tested)  LOWER EXTREMITY MMT:  MMT Right eval Left eval  Hip flexion 4+ 3+  Hip extension    Hip abduction 4 4-  Hip adduction 5 5  Hip internal rotation    Hip external rotation    Knee flexion 5 3+  Knee extension 5 3- (limited due to ROM)  Ankle dorsiflexion    Ankle plantarflexion    Ankle inversion    Ankle eversion     (Blank rows = not tested)  FUNCTIONAL TESTS:  5 times sit to stand: L LE forward with bilat UE support; 23 sec Dynamic Gait Index: 14/24     GAIT: Distance walked: In clinic during DGI Assistive device utilized: Walker - 2 wheeled Level of assistance: Modified independence Comments: even step lengths bilat but diminished knee flexion during swing phase and limited knee ext during stance phase                                                                                                                                TREATMENT DATE:   01/30/24  Nustep L 5 6 min Bike seat 15  2  min full rev then up to seat 14 3 min Hs curls 25lb 2x10 BIL LLE 15# 10 x Leg ext 10lb 2x10 BIL LLE 5# 10 x S2S holding blue ball 2x10 - 1 set on airex 6in step ups x10 each  Leg press 20# LLE 2 sets 10 4 inch step down 10 x with UE  L knee PROM   L tibiofemoral Jt mobs L knee flex & Ext stretch  A/Pass sitting after stretching    3/2-  92/106 L knee contract relax for flex    01/22/24 Bike L3 partial revolutions x 4 min NuStep L5 x4 min LE only Resisted gait 30lb 4 way x 3 each 6in step ups x10 each  Hs curls 25lb 2x10 Leg ext 10lb 2x10 S2S holding blue ball 2x10   L knee PROM   L tibiofemoral Jt mobs L knee flex & Ext stretch L knee contract relax for flex  01/18/24 NuStep L 5 x 6 min HS curls 25lb 2x10 Leg Ext 5lb 2x10 Leg press 30lb 2x10 L knee PROM   L tibiofemoral Jt mobs L knee flex & Ext stretch LLE LAQ 3lb 2x10 LLE HS curls green 2x10 Slant board calf stretch   01/15/24   Nustep L1 all four extremities for ROM, w/u, tissue perfusion   Knee flexion AAROM 12x3 second holds  LAQs 3# x15 LLE    Retrograde massage LEs in elevation for edema control  Percussion gun L quad with LEs elevated  Attempted knee extension overpressure- unable to tolerate due to knee pain    01/11/24 NuStep L 5 x 6 min L knee PROM   L tibiofemoral Jt mobs L knee flex & Ext stretch Quad sets LLE x 10 HS curls AROM LLE x10 Vaso L knee low pressure x10 minuted  01/08/24 NuStep L 5 x 6 min  L knee PROM   L tibiofemoral Jt mobs L knee flex & Ext stretch S2S 2x10 Leg Press 40lb 2x10, LLE 20lb 2x5 SAQ LLE 2lb 2x10 LLE 2lb SLR x10, w/ ER x10  01/04/24 Nustep L5 x 5 min LEs/UEs L foot on 4" step knee bends with PT overpressure for knee flexion x10 Forward step up on 4" step 2x10 Side step up on 4" step 2x10 Step down 4" step 2x10 Counter support squat 2x10 Squat to chair x5  Sitting heel slide x5 with overpressure x5" to get to 90 deg of flexion Sitting hamstring  stretch 2x30" Sitting quad set 2x10 with PT overpressure Standing terminal knee ext against ball on wall 3x10 Amb with SPC x 2 laps around gym Backwards walking with SPC Side stepping with [facility]  01/01/24 Quad sets LLE L knee PROM  L tibiofemoral Jt mobs L knee flex & Ext stretch NuStep L 5 x6 min LAQ 2lb 2x10  S2S 2x10 w/ UE assist  Gait 150 ft decrease stance time on LLE, Decrease LLE TKE L knee Vaso Med x 10 min  12/28/23 See HEP below    PATIENT EDUCATION:  Education details: Exam findings, POC, initial HEP Person educated: Patient Education method: Explanation, Demonstration, and Handouts Education comprehension: verbalized understanding, returned demonstration, and needs further education  HOME EXERCISE PROGRAM: Access Code: [email or web address] URL: [email or web address] Date: 01/04/2024 Prepared by: Vernon Prey April Kirstie Peri  Exercises - Standing Knee Flexion Stretch on Step  - 1 x daily - 7 x weekly - 2 sets - 10 reps - Long Sitting Quad Set with Towel Roll Under Heel  - 1 x daily - 7  x weekly - 2 sets - 10 reps - Supine Hamstring Stretch with Strap  - 1 x daily - 7 x weekly - 2 sets - 30 sec hold - Squat with Chair Touch  - 1 x daily - 7 x weekly - 3 sets - 10 reps - Step Up  - 1 x daily - 7 x weekly - 2 sets - 10 reps - Lateral Step Up  - 1 x daily - 7 x weekly - 2 sets - 10 reps - Forward Step Down Touch with Heel  - 1 x daily - 7 x weekly - 2 sets - 10 reps  ASSESSMENT:  CLINICAL IMPRESSION:  Pt enters doing well  stating saw MD and he was pleased, trying to wean off pain medicine. Still pain esp at night and needs recovery time after therapy days. ROM increased as documented above difference in act and passive flexion is weakness. Cued with ex for speed and control. Pt would benefit from skilled PT serviced to increase L knee strength and ROM for better quality of life.    OBJECTIVE IMPAIRMENTS: Abnormal gait, decreased activity tolerance, decreased  balance, decreased endurance, decreased mobility, difficulty walking, decreased ROM, decreased strength, hypomobility, increased fascial restrictions, impaired flexibility, improper body mechanics, postural dysfunction, and pain.   ACTIVITY LIMITATIONS: carrying, lifting, bending, sitting, standing, squatting, sleeping, stairs, transfers, bed mobility, bathing, toileting, dressing, hygiene/grooming, and locomotion level  PARTICIPATION LIMITATIONS: meal prep, cleaning, driving, shopping, community activity, and yard work  PERSONAL FACTORS: Age, Fitness, Past/current experiences, and Time since onset of injury/illness/exacerbation are also affecting patient's functional outcome.   REHAB POTENTIAL: Good  CLINICAL DECISION MAKING: Evolving/moderate complexity  EVALUATION COMPLEXITY: Moderate   GOALS: Goals reviewed with patient? Yes   SHORT TERM GOALS: Target date: 01/25/2024   Independent with initial HEP. Baseline: Newly provided Goal status: Progressing 01/08/24  2.  Pt will be able to perform 5x STS without keeping L foot in front of R Baseline: Increased weight on R with L foot in front Goal status: Progressing 01/22/24  3.  Pt will have improved knee ROM to 10-90 deg Baseline: 35-68 deg in sitting Goal status: INITIAL   LONG TERM GOALS: Target date:  02/22/2024   Independent with advanced/ongoing HEP to improve outcomes and carryover.  Baseline: Initial HEP provided Goal status: INITIAL  2.  Peter Blair will demonstrate knee ROM from 5 to 120 deg to ascend/descend stairs. Baseline:  Goal status: INITIAL  3.  Peter Blair will be able to ambulate 1000' safely with LRAD and normal gait pattern to access community.  Baseline:  Goal status: INITIAL  4.  Peter Blair will have improved LEFS to >/=42.5%  Baseline: 32.5% Goal status: INITIAL  5.  Peter Blair will demonstrate > 19/24 on DGI to demonstrate decreased risk of falls.   Baseline: 14/24 Goal status:  INITIAL  6.  Peter Blair will demonstrate improved functional LE strength by completing 5x STS in <15 seconds.  (MCID 5 seconds) Baseline: 23 sec Goal status: INITIAL   PLAN:  PT FREQUENCY: 2x/week  PT DURATION: 8 weeks  PLANNED INTERVENTIONS: 97164- PT Re-evaluation, 97110-Therapeutic exercises, 97530- Therapeutic activity, [id number]- Neuromuscular re-education, 97535- Self Care, 16109- Manual therapy, [id number]- Gait training, [phone]- Aquatic Therapy, 97014- Electrical stimulation (unattended), 97016- Vasopneumatic device, [id number]- Ultrasound, 09811- Ionotophoresis 4mg /ml Dexamethasone, Patient/Family education, Balance training, Stair training, Taping, Dry Needling, Joint mobilization, Spinal mobilization, Scar mobilization, DME instructions, Cryotherapy, and Moist heat  PLAN FOR NEXT SESSION:  10th visit assessment  Patient Details  Name: Peter Blair MRN: [id number] Date of Birth: 1941/10/07 Referring Provider:  [name]*  Encounter Date: 01/30/2024   Suanne Marker, PTA 01/30/2024, 10:57 AM  [facility] [address] [place], [place], [address] Phone: [phone]   Fax:  [phone]

## 2024-02-01 ENCOUNTER — Encounter: Payer: Self-pay | Admitting: Physical Therapy

## 2024-02-01 ENCOUNTER — Ambulatory Visit: Payer: Medicare Other | Admitting: Physical Therapy

## 2024-02-01 DIAGNOSIS — R600 Localized edema: Secondary | ICD-10-CM

## 2024-02-01 DIAGNOSIS — M6281 Muscle weakness (generalized): Secondary | ICD-10-CM

## 2024-02-01 DIAGNOSIS — M25662 Stiffness of left knee, not elsewhere classified: Secondary | ICD-10-CM

## 2024-02-01 DIAGNOSIS — R262 Difficulty in walking, not elsewhere classified: Secondary | ICD-10-CM

## 2024-02-01 DIAGNOSIS — M25562 Pain in left knee: Secondary | ICD-10-CM | POA: Diagnosis not present

## 2024-02-01 NOTE — Therapy (Unsigned)
 OUTPATIENT PHYSICAL THERAPY LOWER EXTREMITY TREATMENT Progress Note Reporting Period 12/28/23 to 02/02/24  See note below for Objective Data and Assessment of Progress/Goals.      Patient Name: Peter Blair MRN: [id number] DOB:Jun 05, 1941, 83 y.o., male Today's Date: 02/02/2024  END OF SESSION:  PT End of Session - 02/01/24 1302     Visit Number 10    Date for PT Re-Evaluation 02/22/24    PT Start Time 1300    PT Stop Time 1345    PT Time Calculation (min) 45 min    Activity Tolerance Patient tolerated treatment well    Behavior During Therapy [facility] for tasks assessed/performed               Past Medical History:  Diagnosis Date   Coronary artery disease    exertional chest pain, cath 12/10 showed EF 50-55% with mild inferior hypokensis, 99% proximal and 60% distal RCA stenosis, 80% prox stenosis of a moderate sized D1, 40% prox LAD. Patient had BMS x2 placed in RCA. Unstable Angina. PROMUS DES to the prox RCA   Diabetes mellitus without complication (HCC)    Erectile dysfunction    GERD (gastroesophageal reflux disease)    Hyperlipidemia    Hypertension    Nephrolithiasis    Dr. Achilles Dunk   Renal cyst    Right shoulder pain    rotator cuff   Skin cancer    Skin cancer, PMH of, cell type unknown, Dr. Margo Aye   Past Surgical History:  Procedure Laterality Date   CATARACT EXTRACTION W/ INTRAOCULAR LENS IMPLANT Bilateral    colonoscopy with ploypectomy  03/05/2012    Dr Jarold Motto, GI   CORONARY ANGIOPLASTY WITH STENT PLACEMENT     X 3 total stents   cystoscopic removal  04/04/2010   CYSTOSCOPY N/A 12/15/2023   Procedure: CYSTOSCOPY FLEXIBLE; URETHERAL DILATION; INSERTION OF FOLEY CATHETER;  Surgeon: [name], MD;  Location: [facility];  Service: Urology;  Laterality: N/A;   Flexibe sigmoidoscopy   12/06/1999   LITHOTRIPSY     x 1   RECONSTRUCTION OF EYELID Left    TOTAL KNEE ARTHROPLASTY Left 12/15/2023   Procedure: LEFT TOTAL KNEE ARTHROPLASTY;  Surgeon:  [name], MD;  Location: [facility];  Service: Orthopedics;  Laterality: Left;   Patient Active Problem List   Diagnosis Date Noted   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/15/2023   Unilateral primary osteoarthritis, right hip 09/25/2023   Actinic keratosis 10/17/2018   Type 2 diabetes mellitus with other circulatory complications (HCC) 12/11/2017   Osteoarthritis, multiple sites 10/11/2017   Preventative health care 08/05/2015   Advance directive discussed with patient 08/05/2015   GERD (gastroesophageal reflux disease)    Hyperplastic colonic polyp 12/17/2013   Benign prostatic hyperplasia with urinary obstruction 01/16/2013   ED (erectile dysfunction) of organic origin 01/16/2013   Coronary atherosclerosis of native coronary artery 11/20/2009   NEPHROLITHIASIS 09/29/2009   RENAL CYST, RIGHT 09/29/2009   Hyperlipemia 09/16/2008   SKIN CANCER, HX OF 09/16/2008   Essential hypertension, benign 07/09/2007    PCP: [name], MD  REFERRING PROVIDER: [name], MD  REFERRING DIAG:  [phone] (ICD-10-CM) - Unilateral primary osteoarthritis, left knee   THERAPY DIAG:  Stiffness of left knee, not elsewhere classified  Localized edema  Muscle weakness (generalized)  Difficulty in walking, not elsewhere classified  Rationale for Evaluation and Treatment: Rehabilitation  ONSET DATE: 12/15/23  SUBJECTIVE:   SUBJECTIVE STATEMENT:  Feel pretty good, take time to  get over last session   PERTINENT HISTORY: R hip OA  PAIN:  Are you having pain? No 2/10  L knee  PRECAUTIONS: Fall  RED FLAGS: None   WEIGHT BEARING RESTRICTIONS: No  FALLS:  Has patient fallen in last 6 months? Yes. Number of falls 1 in September. Fell in a ditch trying to chainsaw a tree that fell in the street  LIVING ENVIRONMENT: Lives with: lives with their spouse, Peter Blair Lives in: House/apartment Stairs: Yes: External: 3 steps; on right going up Has following  equipment at home: [name] - 2 wheeled  OCCUPATION: Retired - likes to fish  PLOF: Independent  PATIENT GOALS: Get back to fishing and normal activities  NEXT MD VISIT: 01/25/24  OBJECTIVE:  Note: Objective measures were completed at Evaluation unless otherwise noted.  DIAGNOSTIC FINDINGS: xray 12/15/23 IMPRESSION: Interval total left knee arthroplasty without evidence of hardware failure.  PATIENT SURVEYS:  Lower Extremity Functional Score: 26 / 80 = 32.5 %  COGNITION: Overall cognitive status: Within functional limits for tasks assessed     SENSATION: WFL  EDEMA:  L knee 50% bigger than R LE  MUSCLE LENGTH: Hamstrings: limited due to knee ROM on L Thomas test: Did not assess   LOWER EXTREMITY ROM:  Active ROM Right eval Left eval Left 01/04/24 Left 01/15/24 Left 02/01/24  Hip flexion       Hip extension       Hip abduction       Hip adduction       Hip internal rotation       Hip external rotation       Knee flexion 125 68 90 87* AAROM, only able to get to 70* AROM due to pain  95  Knee extension -5 -35 -12 -6* AROM supine  10  Ankle dorsiflexion       Ankle plantarflexion       Ankle inversion       Ankle eversion        (Blank rows = not tested)  LOWER EXTREMITY MMT:  MMT Right eval Left eval  Hip flexion 4+ 3+  Hip extension    Hip abduction 4 4-  Hip adduction 5 5  Hip internal rotation    Hip external rotation    Knee flexion 5 3+  Knee extension 5 3- (limited due to ROM)  Ankle dorsiflexion    Ankle plantarflexion    Ankle inversion    Ankle eversion     (Blank rows = not tested)  FUNCTIONAL TESTS:  5 times sit to stand: L LE forward with bilat UE support; 23 sec Dynamic Gait Index: 14/24     GAIT: Distance walked: In clinic during DGI Assistive device utilized: Walker - 2 wheeled Level of assistance: Modified independence Comments: even step lengths bilat but diminished knee flexion during swing phase and limited knee ext during  stance phase  TREATMENT DATE:  02/01/24 NuStep L5 x6 min GOALS L knee PROM   L tibiofemoral Jt mobs L knee flex & Ext stretch Leg press 40lb 2x10 6in step ups x10 each Heel raises 2x10 HS curls 35lb 2x10 Led Ext 10lb 2x10 S2S holding blue ball 2x10  on airex  01/30/24 Nustep L 5 6 min Bike seat 15 2 min full rev then up to seat 14 3 min Hs curls 25lb 2x10 BIL LLE 15# 10 x Leg ext 10lb 2x10 BIL LLE 5# 10 x S2S holding blue ball 2x10 - 1 set on airex 6in step ups x10 each  Leg press 20# LLE 2 sets 10 4 inch step down 10 x with UE  L knee PROM   L tibiofemoral Jt mobs L knee flex & Ext stretch  A/Pass sitting after stretching    3/2-  92/106 L knee contract relax for flex  01/22/24 Bike L3 partial revolutions x 4 min NuStep L5 x4 min LE only Resisted gait 30lb 4 way x 3 each 6in step ups x10 each  Hs curls 25lb 2x10 Leg ext 10lb 2x10 S2S holding blue ball 2x10   L knee PROM   L tibiofemoral Jt mobs L knee flex & Ext stretch L knee contract relax for flex  01/18/24 NuStep L 5 x 6 min HS curls 25lb 2x10 Leg Ext 5lb 2x10 Leg press 30lb 2x10 L knee PROM   L tibiofemoral Jt mobs L knee flex & Ext stretch LLE LAQ 3lb 2x10 LLE HS curls green 2x10 Slant board calf stretch   01/15/24   Nustep L1 all four extremities for ROM, w/u, tissue perfusion   Knee flexion AAROM 12x3 second holds  LAQs 3# x15 LLE    Retrograde massage LEs in elevation for edema control  Percussion gun L quad with LEs elevated  Attempted knee extension overpressure- unable to tolerate due to knee pain    01/11/24 NuStep L 5 x 6 min L knee PROM   L tibiofemoral Jt mobs L knee flex & Ext stretch Quad sets LLE x 10 HS curls AROM LLE x10 Vaso L knee low pressure x10 minuted  01/08/24 NuStep L 5 x 6 min  L knee PROM   L tibiofemoral Jt mobs L knee flex & Ext  stretch S2S 2x10 Leg Press 40lb 2x10, LLE 20lb 2x5 SAQ LLE 2lb 2x10 LLE 2lb SLR x10, w/ ER x10  01/04/24 Nustep L5 x 5 min LEs/UEs L foot on 4" step knee bends with PT overpressure for knee flexion x10 Forward step up on 4" step 2x10 Side step up on 4" step 2x10 Step down 4" step 2x10 Counter support squat 2x10 Squat to chair x5  Sitting heel slide x5 with overpressure x5" to get to 90 deg of flexion Sitting hamstring stretch 2x30" Sitting quad set 2x10 with PT overpressure Standing terminal knee ext against ball on wall 3x10 Amb with SPC x 2 laps around gym Backwards walking with SPC Side stepping with [facility]  01/01/24 Quad sets LLE L knee PROM  L tibiofemoral Jt mobs L knee flex & Ext stretch NuStep L 5 x6 min LAQ 2lb 2x10  S2S 2x10 w/ UE assist  Gait 150 ft decrease stance time on LLE, Decrease LLE TKE L knee Vaso Med x 10 min  12/28/23 See HEP below    PATIENT EDUCATION:  Education details: Exam findings, POC, initial HEP Person educated: Patient Education method: Explanation, Demonstration, and Handouts Education comprehension: verbalized understanding, returned demonstration, and  needs further education  HOME EXERCISE PROGRAM: Access Code: [email or web address] URL: [email or web address] Date: 01/04/2024 Prepared by: Peter Blair  Exercises - Standing Knee Flexion Stretch on Step  - 1 x daily - 7 x weekly - 2 sets - 10 reps - Long Sitting Quad Set with Towel Roll Under Heel  - 1 x daily - 7 x weekly - 2 sets - 10 reps - Supine Hamstring Stretch with Strap  - 1 x daily - 7 x weekly - 2 sets - 30 sec hold - Squat with Chair Touch  - 1 x daily - 7 x weekly - 3 sets - 10 reps - Step Up  - 1 x daily - 7 x weekly - 2 sets - 10 reps - Lateral Step Up  - 1 x daily - 7 x weekly - 2 sets - 10 reps - Forward Step Down Touch with Heel  - 1 x daily - 7 x weekly - 2 sets - 10 reps  ASSESSMENT:  CLINICAL IMPRESSION:  Pt enters doing well, he continues to  report some soreness after PT session. He has progress towards bith long and Short term goals. Despite the increase in L knee AROM he continues to lack 10 degrees of extension   Cued with ex for speed and control. Cues to fully extend LLE with step ups. Pt would benefit from skilled PT serviced to increase L knee strength and ROM for better quality of life.    OBJECTIVE IMPAIRMENTS: Abnormal gait, decreased activity tolerance, decreased balance, decreased endurance, decreased mobility, difficulty walking, decreased ROM, decreased strength, hypomobility, increased fascial restrictions, impaired flexibility, improper body mechanics, postural dysfunction, and pain.   ACTIVITY LIMITATIONS: carrying, lifting, bending, sitting, standing, squatting, sleeping, stairs, transfers, bed mobility, bathing, toileting, dressing, hygiene/grooming, and locomotion level  PARTICIPATION LIMITATIONS: meal prep, cleaning, driving, shopping, community activity, and yard work  PERSONAL FACTORS: Age, Fitness, Past/current experiences, and Time since onset of injury/illness/exacerbation are also affecting patient's functional outcome.   REHAB POTENTIAL: Good  CLINICAL DECISION MAKING: Evolving/moderate complexity  EVALUATION COMPLEXITY: Moderate   GOALS: Goals reviewed with patient? Yes   SHORT TERM GOALS: Target date: 01/25/2024   Independent with initial HEP. Baseline: Newly provided Goal status: Met  02/01/24  2.  Pt will be able to perform 5x STS without keeping L foot in front of R Baseline: Increased weight on R with L foot in front Goal status: Met  02/01/24  3.  Pt will have improved knee ROM to 10-90 deg Baseline: 35-68 deg in sitting Goal status: Met 02/01/24   LONG TERM GOALS: Target date:  02/22/2024   Independent with advanced/ongoing HEP to improve outcomes and carryover.  Baseline: Initial HEP provided Goal status: INITIAL  2.  Alfonso Patten will demonstrate knee ROM from 5 to 120 deg to  ascend/descend stairs. Baseline:  Goal status: Ongoing 02/01/24  3.  JERYN CERNEY will be able to ambulate 1000' safely with LRAD and normal gait pattern to access community.  Baseline:  Goal status: Met 02/01/24  4.  Alfonso Patten will have improved LEFS to >/=42.5%  Baseline: 32.5% Goal status: INITIAL  5.  FAARIS ARIZPE will demonstrate > 19/24 on DGI to demonstrate decreased risk of falls.   Baseline: 14/24 Goal status: INITIAL  6.  Alfonso Patten will demonstrate improved functional LE strength by completing 5x STS in <15 seconds.  (MCID 5 seconds) Baseline: 23 sec Goal status: INITIAL   PLAN:  PT FREQUENCY:  2x/week  PT DURATION: 8 weeks  PLANNED INTERVENTIONS: 97164- PT Re-evaluation, 97110-Therapeutic exercises, 97530- Therapeutic activity, [id number]- Neuromuscular re-education, 97535- Self Care, [id number]- Manual therapy, [id number]- Gait training, [phone]- Aquatic Therapy, 97014- Electrical stimulation (unattended), 97016- Vasopneumatic device, [id number]- Ultrasound, 09811- Ionotophoresis 4mg /ml Dexamethasone, Patient/Family education, Balance training, Stair training, Taping, Dry Needling, Joint mobilization, Spinal mobilization, Scar mobilization, DME instructions, Cryotherapy, and Moist heat  PLAN FOR NEXT SESSION: L knee ROM and strenght   Cassie Freer, PT 02/02/2024, 8:01 AM

## 2024-02-05 ENCOUNTER — Encounter: Payer: Self-pay | Admitting: Physical Therapy

## 2024-02-05 ENCOUNTER — Ambulatory Visit: Payer: Medicare Other | Attending: Orthopaedic Surgery | Admitting: Physical Therapy

## 2024-02-05 ENCOUNTER — Other Ambulatory Visit: Payer: Self-pay | Admitting: Internal Medicine

## 2024-02-05 DIAGNOSIS — M6281 Muscle weakness (generalized): Secondary | ICD-10-CM

## 2024-02-05 DIAGNOSIS — R600 Localized edema: Secondary | ICD-10-CM

## 2024-02-05 DIAGNOSIS — M25562 Pain in left knee: Secondary | ICD-10-CM | POA: Diagnosis not present

## 2024-02-05 DIAGNOSIS — M25662 Stiffness of left knee, not elsewhere classified: Secondary | ICD-10-CM

## 2024-02-05 DIAGNOSIS — R262 Difficulty in walking, not elsewhere classified: Secondary | ICD-10-CM

## 2024-02-05 NOTE — Therapy (Signed)
 OUTPATIENT PHYSICAL THERAPY LOWER EXTREMITY TREATMENT        Patient Name: Peter Blair MRN: [id number] DOB:07-27-1941, 83 y.o., male Today's Date: 02/05/2024  END OF SESSION:  PT End of Session - 02/05/24 1302     Visit Number 11    Number of Visits 17    Date for PT Re-Evaluation 02/22/24    Authorization Type [employer]    Progress Note Due on Visit 20    PT Start Time 1259    PT Stop Time 1339    PT Time Calculation (min) 40 min    Activity Tolerance Patient tolerated treatment well    Behavior During Therapy [facility] for tasks assessed/performed                Past Medical History:  Diagnosis Date   Coronary artery disease    exertional chest pain, cath 12/10 showed EF 50-55% with mild inferior hypokensis, 99% proximal and 60% distal RCA stenosis, 80% prox stenosis of a moderate sized D1, 40% prox LAD. Patient had BMS x2 placed in RCA. Unstable Angina. PROMUS DES to the prox RCA   Diabetes mellitus without complication (HCC)    Erectile dysfunction    GERD (gastroesophageal reflux disease)    Hyperlipidemia    Hypertension    Nephrolithiasis    Dr. Achilles Dunk   Renal cyst    Right shoulder pain    rotator cuff   Skin cancer    Skin cancer, PMH of, cell type unknown, Dr. Margo Aye   Past Surgical History:  Procedure Laterality Date   CATARACT EXTRACTION W/ INTRAOCULAR LENS IMPLANT Bilateral    colonoscopy with ploypectomy  03/05/2012    Dr Jarold Motto, GI   CORONARY ANGIOPLASTY WITH STENT PLACEMENT     X 3 total stents   cystoscopic removal  04/04/2010   CYSTOSCOPY N/A 12/15/2023   Procedure: CYSTOSCOPY FLEXIBLE; URETHERAL DILATION; INSERTION OF FOLEY CATHETER;  Surgeon: [name], MD;  Location: [facility];  Service: Urology;  Laterality: N/A;   Flexibe sigmoidoscopy   12/06/1999   LITHOTRIPSY     x 1   RECONSTRUCTION OF EYELID Left    TOTAL KNEE ARTHROPLASTY Left 12/15/2023   Procedure: LEFT TOTAL KNEE ARTHROPLASTY;  Surgeon: [name], MD;   Location: [facility];  Service: Orthopedics;  Laterality: Left;   Patient Active Problem List   Diagnosis Date Noted   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/15/2023   Unilateral primary osteoarthritis, right hip 09/25/2023   Actinic keratosis 10/17/2018   Type 2 diabetes mellitus with other circulatory complications (HCC) 12/11/2017   Osteoarthritis, multiple sites 10/11/2017   Preventative health care 08/05/2015   Advance directive discussed with patient 08/05/2015   GERD (gastroesophageal reflux disease)    Hyperplastic colonic polyp 12/17/2013   Benign prostatic hyperplasia with urinary obstruction 01/16/2013   ED (erectile dysfunction) of organic origin 01/16/2013   Coronary atherosclerosis of native coronary artery 11/20/2009   NEPHROLITHIASIS 09/29/2009   RENAL CYST, RIGHT 09/29/2009   Hyperlipemia 09/16/2008   SKIN CANCER, HX OF 09/16/2008   Essential hypertension, benign 07/09/2007    PCP: [name], MD  REFERRING PROVIDER: [name], MD  REFERRING DIAG:  [phone] (ICD-10-CM) - Unilateral primary osteoarthritis, left knee   THERAPY DIAG:  Stiffness of left knee, not elsewhere classified  Localized edema  Muscle weakness (generalized)  Difficulty in walking, not elsewhere classified  Acute pain of left knee  Rationale for Evaluation and Treatment: Rehabilitation  ONSET DATE:  12/15/23  SUBJECTIVE:   SUBJECTIVE STATEMENT:  Nothing really new, felt last PT session   PERTINENT HISTORY: R hip OA  PAIN:  Are you having pain? No 0/10 now   PRECAUTIONS: Fall  RED FLAGS: None   WEIGHT BEARING RESTRICTIONS: No  FALLS:  Has patient fallen in last 6 months? Yes. Number of falls 1 in September. Fell in a ditch trying to chainsaw a tree that fell in the street  LIVING ENVIRONMENT: Lives with: lives with their spouse, [name] Lives in: House/apartment Stairs: Yes: External: 3 steps; on right going up Has following equipment at home:  [name] - 2 wheeled  OCCUPATION: Retired - likes to fish  PLOF: Independent  PATIENT GOALS: Get back to fishing and normal activities  NEXT MD VISIT: 02/28/24  OBJECTIVE:  Note: Objective measures were completed at Evaluation unless otherwise noted.  DIAGNOSTIC FINDINGS: xray 12/15/23 IMPRESSION: Interval total left knee arthroplasty without evidence of hardware failure.  PATIENT SURVEYS:  Lower Extremity Functional Score: 26 / 80 = 32.5 %  COGNITION: Overall cognitive status: Within functional limits for tasks assessed     SENSATION: WFL  EDEMA:  L knee 50% bigger than R LE  MUSCLE LENGTH: Hamstrings: limited due to knee ROM on L Thomas test: Did not assess   LOWER EXTREMITY ROM:  Active ROM Right eval Left eval Left 01/04/24 Left 01/15/24 Left 02/01/24 Left 02/05/24  Hip flexion        Hip extension        Hip abduction        Hip adduction        Hip internal rotation        Hip external rotation        Knee flexion 125 68 90 87* AAROM, only able to get to 70* AROM due to pain  95 AAROM 106*, AROM 98*  Knee extension -5 -35 -12 -6* AROM supine  10 6* seated quad set AROM with leg propped on PT's knee   Ankle dorsiflexion        Ankle plantarflexion        Ankle inversion        Ankle eversion         (Blank rows = not tested)  LOWER EXTREMITY MMT:  MMT Right eval Left eval  Hip flexion 4+ 3+  Hip extension    Hip abduction 4 4-  Hip adduction 5 5  Hip internal rotation    Hip external rotation    Knee flexion 5 3+  Knee extension 5 3- (limited due to ROM)  Ankle dorsiflexion    Ankle plantarflexion    Ankle inversion    Ankle eversion     (Blank rows = not tested)  FUNCTIONAL TESTS:  5 times sit to stand: L LE forward with bilat UE support; 23 sec Dynamic Gait Index: 14/24     GAIT: Distance walked: In clinic during DGI Assistive device utilized: Walker - 2 wheeled Level of assistance: Modified independence Comments: even step lengths  bilat but diminished knee flexion during swing phase and limited knee ext during stance phase  TREATMENT DATE:   02/05/24  Nustep L5 x8 minutes for w/u and ROM Knee flexion stretch 12 inch step 12 x10 seconds HS stretch on steps 3x30 seconds  Bike seat 15 x3 minutes full rotations, then seat 13 full rotations x3 minutes  Knee flexion self AAROM x12 ROM check Forward step ups 6 inch step 2x12 L LE  Forward step downs partial range 6 inch step 2x12 L LE   Patella mobs all directions grade III Knee extension overpressure      02/01/24 NuStep L5 x6 min GOALS L knee PROM   L tibiofemoral Jt mobs L knee flex & Ext stretch Leg press 40lb 2x10 6in step ups x10 each Heel raises 2x10 HS curls 35lb 2x10 Led Ext 10lb 2x10 S2S holding blue ball 2x10  on airex  01/30/24 Nustep L 5 6 min Bike seat 15 2 min full rev then up to seat 14 3 min Hs curls 25lb 2x10 BIL LLE 15# 10 x Leg ext 10lb 2x10 BIL LLE 5# 10 x S2S holding blue ball 2x10 - 1 set on airex 6in step ups x10 each  Leg press 20# LLE 2 sets 10 4 inch step down 10 x with UE  L knee PROM   L tibiofemoral Jt mobs L knee flex & Ext stretch  A/Pass sitting after stretching    3/2-  92/106 L knee contract relax for flex  01/22/24 Bike L3 partial revolutions x 4 min NuStep L5 x4 min LE only Resisted gait 30lb 4 way x 3 each 6in step ups x10 each  Hs curls 25lb 2x10 Leg ext 10lb 2x10 S2S holding blue ball 2x10   L knee PROM   L tibiofemoral Jt mobs L knee flex & Ext stretch L knee contract relax for flex  01/18/24 NuStep L 5 x 6 min HS curls 25lb 2x10 Leg Ext 5lb 2x10 Leg press 30lb 2x10 L knee PROM   L tibiofemoral Jt mobs L knee flex & Ext stretch LLE LAQ 3lb 2x10 LLE HS curls green 2x10 Slant board calf stretch   01/15/24   Nustep L1 all four extremities for ROM, w/u, tissue  perfusion   Knee flexion AAROM 12x3 second holds  LAQs 3# x15 LLE    Retrograde massage LEs in elevation for edema control  Percussion gun L quad with LEs elevated  Attempted knee extension overpressure- unable to tolerate due to knee pain    01/11/24 NuStep L 5 x 6 min L knee PROM   L tibiofemoral Jt mobs L knee flex & Ext stretch Quad sets LLE x 10 HS curls AROM LLE x10 Vaso L knee low pressure x10 minuted  01/08/24 NuStep L 5 x 6 min  L knee PROM   L tibiofemoral Jt mobs L knee flex & Ext stretch S2S 2x10 Leg Press 40lb 2x10, LLE 20lb 2x5 SAQ LLE 2lb 2x10 LLE 2lb SLR x10, w/ ER x10  01/04/24 Nustep L5 x 5 min LEs/UEs L foot on 4" step knee bends with PT overpressure for knee flexion x10 Forward step up on 4" step 2x10 Side step up on 4" step 2x10 Step down 4" step 2x10 Counter support squat 2x10 Squat to chair x5  Sitting heel slide x5 with overpressure x5" to get to 90 deg of flexion Sitting hamstring stretch 2x30" Sitting quad set 2x10 with PT overpressure Standing terminal knee ext against ball on wall 3x10 Amb with SPC x 2 laps around gym Backwards walking with SPC Side stepping with SPC  01/01/24 Quad sets LLE L knee PROM  L tibiofemoral Jt mobs L knee flex & Ext stretch NuStep L 5 x6 min LAQ 2lb 2x10  S2S 2x10 w/ UE assist  Gait 150 ft decrease stance time on LLE, Decrease LLE TKE L knee Vaso Med x 10 min  12/28/23 See HEP below    PATIENT EDUCATION:  Education details: Exam findings, POC, initial HEP Person educated: Patient Education method: Explanation, Demonstration, and Handouts Education comprehension: verbalized understanding, returned demonstration, and needs further education  HOME EXERCISE PROGRAM: Access Code: [email or web address] URL: [email or web address] Date: 01/04/2024 Prepared by: Vernon Prey April Kirstie Peri  Exercises - Standing Knee Flexion Stretch on Step  - 1 x daily - 7 x weekly - 2 sets - 10 reps - Long Sitting Quad  Set with Towel Roll Under Heel  - 1 x daily - 7 x weekly - 2 sets - 10 reps - Supine Hamstring Stretch with Strap  - 1 x daily - 7 x weekly - 2 sets - 30 sec hold - Squat with Chair Touch  - 1 x daily - 7 x weekly - 3 sets - 10 reps - Step Up  - 1 x daily - 7 x weekly - 2 sets - 10 reps - Lateral Step Up  - 1 x daily - 7 x weekly - 2 sets - 10 reps - Forward Step Down Touch with Heel  - 1 x daily - 7 x weekly - 2 sets - 10 reps  ASSESSMENT:  CLINICAL IMPRESSION:  Pt arrives today doing well, we continued focus on ROM and strength as appropriate, especially on flexion so that we can get him over 100* prior to next MD visit. Tolerated session well today, we will continue to challenge as appropriate.    OBJECTIVE IMPAIRMENTS: Abnormal gait, decreased activity tolerance, decreased balance, decreased endurance, decreased mobility, difficulty walking, decreased ROM, decreased strength, hypomobility, increased fascial restrictions, impaired flexibility, improper body mechanics, postural dysfunction, and pain.   ACTIVITY LIMITATIONS: carrying, lifting, bending, sitting, standing, squatting, sleeping, stairs, transfers, bed mobility, bathing, toileting, dressing, hygiene/grooming, and locomotion level  PARTICIPATION LIMITATIONS: meal prep, cleaning, driving, shopping, community activity, and yard work  PERSONAL FACTORS: Age, Fitness, Past/current experiences, and Time since onset of injury/illness/exacerbation are also affecting patient's functional outcome.   REHAB POTENTIAL: Good  CLINICAL DECISION MAKING: Evolving/moderate complexity  EVALUATION COMPLEXITY: Moderate   GOALS: Goals reviewed with patient? Yes   SHORT TERM GOALS: Target date: 01/25/2024   Independent with initial HEP. Baseline: Newly provided Goal status: Met  02/01/24  2.  Pt will be able to perform 5x STS without keeping L foot in front of R Baseline: Increased weight on R with L foot in front Goal status: Met   02/01/24  3.  Pt will have improved knee ROM to 10-90 deg Baseline: 35-68 deg in sitting Goal status: Met 02/01/24   LONG TERM GOALS: Target date:  02/22/2024   Independent with advanced/ongoing HEP to improve outcomes and carryover.  Baseline: Initial HEP provided Goal status: INITIAL  2.  Alfonso Patten will demonstrate knee ROM from 5 to 120 deg to ascend/descend stairs. Baseline:  Goal status: Ongoing 02/01/24  3.  ARIAS WEINERT will be able to ambulate 1000' safely with LRAD and normal gait pattern to access community.  Baseline:  Goal status: Met 02/01/24  4.  Alfonso Patten will have improved LEFS to >/=42.5%  Baseline: 32.5% Goal status: INITIAL  5.  Alfonso Patten will demonstrate >  19/24 on DGI to demonstrate decreased risk of falls.   Baseline: 14/24 Goal status: INITIAL  6.  Alfonso Patten will demonstrate improved functional LE strength by completing 5x STS in <15 seconds.  (MCID 5 seconds) Baseline: 23 sec Goal status: INITIAL   PLAN:  PT FREQUENCY: 2x/week  PT DURATION: 8 weeks  PLANNED INTERVENTIONS: 97164- PT Re-evaluation, 97110-Therapeutic exercises, 97530- Therapeutic activity, [id number]- Neuromuscular re-education, 97535- Self Care, 24401- Manual therapy, [id number]- Gait training, [phone]- Aquatic Therapy, 97014- Electrical stimulation (unattended), 97016- Vasopneumatic device, 97035- Ultrasound, 36644- Ionotophoresis 4mg /ml Dexamethasone, Patient/Family education, Balance training, Stair training, Taping, Dry Needling, Joint mobilization, Spinal mobilization, Scar mobilization, DME instructions, Cryotherapy, and Moist heat  PLAN FOR NEXT SESSION: L knee ROM and strength, try to get to 105* or better prior to next MD appt on 02/28/24  Nedra Hai, PT, DPT 02/05/24 1:41 PM

## 2024-02-08 ENCOUNTER — Ambulatory Visit: Payer: Medicare Other | Admitting: Physical Therapy

## 2024-02-08 ENCOUNTER — Encounter: Payer: Self-pay | Admitting: Physical Therapy

## 2024-02-08 DIAGNOSIS — R262 Difficulty in walking, not elsewhere classified: Secondary | ICD-10-CM | POA: Diagnosis not present

## 2024-02-08 DIAGNOSIS — R600 Localized edema: Secondary | ICD-10-CM | POA: Diagnosis not present

## 2024-02-08 DIAGNOSIS — M25562 Pain in left knee: Secondary | ICD-10-CM | POA: Diagnosis not present

## 2024-02-08 DIAGNOSIS — M6281 Muscle weakness (generalized): Secondary | ICD-10-CM | POA: Diagnosis not present

## 2024-02-08 DIAGNOSIS — M25662 Stiffness of left knee, not elsewhere classified: Secondary | ICD-10-CM | POA: Diagnosis not present

## 2024-02-08 NOTE — Therapy (Signed)
 OUTPATIENT PHYSICAL THERAPY LOWER EXTREMITY TREATMENT        Patient Name: Peter Blair MRN: [id number] DOB:09/21/1941, 83 y.o., male Today's Date: 02/08/2024  END OF SESSION:  PT End of Session - 02/08/24 1258     Visit Number 12    Date for PT Re-Evaluation 02/22/24    PT Start Time 1300    PT Stop Time 1345    PT Time Calculation (min) 45 min    Behavior During Therapy [facility] for tasks assessed/performed                Past Medical History:  Diagnosis Date   Coronary artery disease    exertional chest pain, cath 12/10 showed EF 50-55% with mild inferior hypokensis, 99% proximal and 60% distal RCA stenosis, 80% prox stenosis of a moderate sized D1, 40% prox LAD. Patient had BMS x2 placed in RCA. Unstable Angina. PROMUS DES to the prox RCA   Diabetes mellitus without complication (HCC)    Erectile dysfunction    GERD (gastroesophageal reflux disease)    Hyperlipidemia    Hypertension    Nephrolithiasis    Dr. Achilles Dunk   Renal cyst    Right shoulder pain    rotator cuff   Skin cancer    Skin cancer, PMH of, cell type unknown, Dr. Margo Aye   Past Surgical History:  Procedure Laterality Date   CATARACT EXTRACTION W/ INTRAOCULAR LENS IMPLANT Bilateral    colonoscopy with ploypectomy  03/05/2012    Dr Jarold Motto, GI   CORONARY ANGIOPLASTY WITH STENT PLACEMENT     X 3 total stents   cystoscopic removal  04/04/2010   CYSTOSCOPY N/A 12/15/2023   Procedure: CYSTOSCOPY FLEXIBLE; URETHERAL DILATION; INSERTION OF FOLEY CATHETER;  Surgeon: [name], MD;  Location: [facility];  Service: Urology;  Laterality: N/A;   Flexibe sigmoidoscopy   12/06/1999   LITHOTRIPSY     x 1   RECONSTRUCTION OF EYELID Left    TOTAL KNEE ARTHROPLASTY Left 12/15/2023   Procedure: LEFT TOTAL KNEE ARTHROPLASTY;  Surgeon: [name], MD;  Location: [facility];  Service: Orthopedics;  Laterality: Left;   Patient Active Problem List   Diagnosis Date Noted   Status post total left knee  replacement 12/15/2023   Unilateral primary osteoarthritis, right hip 09/25/2023   Actinic keratosis 10/17/2018   Type 2 diabetes mellitus with other circulatory complications (HCC) 12/11/2017   Osteoarthritis, multiple sites 10/11/2017   Preventative health care 08/05/2015   Advance directive discussed with patient 08/05/2015   GERD (gastroesophageal reflux disease)    Hyperplastic colonic polyp 12/17/2013   Benign prostatic hyperplasia with urinary obstruction 01/16/2013   ED (erectile dysfunction) of organic origin 01/16/2013   Coronary atherosclerosis of native coronary artery 11/20/2009   NEPHROLITHIASIS 09/29/2009   RENAL CYST, RIGHT 09/29/2009   Hyperlipemia 09/16/2008   SKIN CANCER, HX OF 09/16/2008   Essential hypertension, benign 07/09/2007    PCP: [name], MD  REFERRING PROVIDER: [name], MD  REFERRING DIAG:  [phone] (ICD-10-CM) - Unilateral primary osteoarthritis, left knee   THERAPY DIAG:  Stiffness of left knee, not elsewhere classified  Localized edema  Difficulty in walking, not elsewhere classified  Muscle weakness (generalized)  Acute pain of left knee  Rationale for Evaluation and Treatment: Rehabilitation  ONSET DATE: 12/15/23  SUBJECTIVE:   SUBJECTIVE STATEMENT:  "Pretty good"   PERTINENT HISTORY: R hip OA  PAIN:  Are you having pain? No 0/10   PRECAUTIONS: Fall  RED  FLAGS: None   WEIGHT BEARING RESTRICTIONS: No  FALLS:  Has patient fallen in last 6 months? Yes. Number of falls 1 in September. Fell in a ditch trying to chainsaw a tree that fell in the street  LIVING ENVIRONMENT: Lives with: lives with their spouse, [name] Lives in: House/apartment Stairs: Yes: External: 3 steps; on right going up Has following equipment at home: [name] - 2 wheeled  OCCUPATION: Retired - likes to fish  PLOF: Independent  PATIENT GOALS: Get back to fishing and normal activities  NEXT MD VISIT: 02/28/24  OBJECTIVE:   Note: Objective measures were completed at Evaluation unless otherwise noted.  DIAGNOSTIC FINDINGS: xray 12/15/23 IMPRESSION: Interval total left knee arthroplasty without evidence of hardware failure.  PATIENT SURVEYS:  Lower Extremity Functional Score: 26 / 80 = 32.5 %  COGNITION: Overall cognitive status: Within functional limits for tasks assessed     SENSATION: WFL  EDEMA:  L knee 50% bigger than R LE  MUSCLE LENGTH: Hamstrings: limited due to knee ROM on L Thomas test: Did not assess   LOWER EXTREMITY ROM:  Active ROM Right eval Left eval Left 01/04/24 Left 01/15/24 Left 02/01/24 Left 02/05/24  Hip flexion        Hip extension        Hip abduction        Hip adduction        Hip internal rotation        Hip external rotation        Knee flexion 125 68 90 87* AAROM, only able to get to 70* AROM due to pain  95 AAROM 106*, AROM 98*  Knee extension -5 -35 -12 -6* AROM supine  10 6* seated quad set AROM with leg propped on PT's knee   Ankle dorsiflexion        Ankle plantarflexion        Ankle inversion        Ankle eversion         (Blank rows = not tested)  LOWER EXTREMITY MMT:  MMT Right eval Left eval  Hip flexion 4+ 3+  Hip extension    Hip abduction 4 4-  Hip adduction 5 5  Hip internal rotation    Hip external rotation    Knee flexion 5 3+  Knee extension 5 3- (limited due to ROM)  Ankle dorsiflexion    Ankle plantarflexion    Ankle inversion    Ankle eversion     (Blank rows = not tested)  FUNCTIONAL TESTS:  5 times sit to stand: L LE forward with bilat UE support; 23 sec Dynamic Gait Index: 14/24     GAIT: Distance walked: In clinic during DGI Assistive device utilized: Walker - 2 wheeled Level of assistance: Modified independence Comments: even step lengths bilat but diminished knee flexion during swing phase and limited knee ext during stance phase  TREATMENT DATE:  02/08/24 NuStep L5 x 6 min HS curls 35lb 2x12 Leg Ext 10lb 2x12 Sit to stand holding 20lb dumbbell 2x10  Leg press 70lb 2x10 6in step ups x10 each   L knee PROM   L tibiofemoral Jt mobs L knee flex & Ext stretch 30lb resisted gait 4 way x 3 each  02/05/24 Nustep L5 x8 minutes for w/u and ROM Knee flexion stretch 12 inch step 12 x10 seconds HS stretch on steps 3x30 seconds  Bike seat 15 x3 minutes full rotations, then seat 13 full rotations x3 minutes  Knee flexion self AAROM x12 ROM check Forward step ups 6 inch step 2x12 L LE  Forward step downs partial range 6 inch step 2x12 L LE   Patella mobs all directions grade III Knee extension overpressure      02/01/24 NuStep L5 x6 min GOALS L knee PROM   L tibiofemoral Jt mobs L knee flex & Ext stretch Leg press 40lb 2x10 6in step ups x10 each Heel raises 2x10 HS curls 35lb 2x10 Led Ext 10lb 2x10 S2S holding blue ball 2x10  on airex  01/30/24 Nustep L 5 6 min Bike seat 15 2 min full rev then up to seat 14 3 min Hs curls 25lb 2x10 BIL LLE 15# 10 x Leg ext 10lb 2x10 BIL LLE 5# 10 x S2S holding blue ball 2x10 - 1 set on airex 6in step ups x10 each  Leg press 20# LLE 2 sets 10 4 inch step down 10 x with UE  L knee PROM   L tibiofemoral Jt mobs L knee flex & Ext stretch  A/Pass sitting after stretching    3/2-  92/106 L knee contract relax for flex  01/22/24 Bike L3 partial revolutions x 4 min NuStep L5 x4 min LE only Resisted gait 30lb 4 way x 3 each 6in step ups x10 each  Hs curls 25lb 2x10 Leg ext 10lb 2x10 S2S holding blue ball 2x10   L knee PROM   L tibiofemoral Jt mobs L knee flex & Ext stretch L knee contract relax for flex  01/18/24 NuStep L 5 x 6 min HS curls 25lb 2x10 Leg Ext 5lb 2x10 Leg press 30lb 2x10 L knee PROM   L tibiofemoral Jt mobs L knee flex & Ext stretch LLE LAQ 3lb 2x10 LLE HS curls green 2x10 Slant board calf  stretch   01/15/24   Nustep L1 all four extremities for ROM, w/u, tissue perfusion   Knee flexion AAROM 12x3 second holds  LAQs 3# x15 LLE    Retrograde massage LEs in elevation for edema control  Percussion gun L quad with LEs elevated  Attempted knee extension overpressure- unable to tolerate due to knee pain    01/11/24 NuStep L 5 x 6 min L knee PROM   L tibiofemoral Jt mobs L knee flex & Ext stretch Quad sets LLE x 10 HS curls AROM LLE x10 Vaso L knee low pressure x10 minuted  01/08/24 NuStep L 5 x 6 min  L knee PROM   L tibiofemoral Jt mobs L knee flex & Ext stretch S2S 2x10 Leg Press 40lb 2x10, LLE 20lb 2x5 SAQ LLE 2lb 2x10 LLE 2lb SLR x10, w/ ER x10  01/04/24 Nustep L5 x 5 min LEs/UEs L foot on 4" step knee bends with PT overpressure for knee flexion x10 Forward step up on 4" step 2x10 Side step up on 4" step 2x10 Step down 4" step 2x10 Counter support squat 2x10  Squat to chair x5  Sitting heel slide x5 with overpressure x5" to get to 90 deg of flexion Sitting hamstring stretch 2x30" Sitting quad set 2x10 with PT overpressure Standing terminal knee ext against ball on wall 3x10 Amb with SPC x 2 laps around gym Backwards walking with SPC Side stepping with [facility]  01/01/24 Quad sets LLE L knee PROM  L tibiofemoral Jt mobs L knee flex & Ext stretch NuStep L 5 x6 min LAQ 2lb 2x10  S2S 2x10 w/ UE assist  Gait 150 ft decrease stance time on LLE, Decrease LLE TKE L knee Vaso Med x 10 min  12/28/23 See HEP below    PATIENT EDUCATION:  Education details: Exam findings, POC, initial HEP Person educated: Patient Education method: Explanation, Demonstration, and Handouts Education comprehension: verbalized understanding, returned demonstration, and needs further education  HOME EXERCISE PROGRAM: Access Code: [email or web address] URL: [email or web address] Date: 01/04/2024 Prepared by: Vernon Prey April Kirstie Peri  Exercises - Standing Knee Flexion  Stretch on Step  - 1 x daily - 7 x weekly - 2 sets - 10 reps - Long Sitting Quad Set with Towel Roll Under Heel  - 1 x daily - 7 x weekly - 2 sets - 10 reps - Supine Hamstring Stretch with Strap  - 1 x daily - 7 x weekly - 2 sets - 30 sec hold - Squat with Chair Touch  - 1 x daily - 7 x weekly - 3 sets - 10 reps - Step Up  - 1 x daily - 7 x weekly - 2 sets - 10 reps - Lateral Step Up  - 1 x daily - 7 x weekly - 2 sets - 10 reps - Forward Step Down Touch with Heel  - 1 x daily - 7 x weekly - 2 sets - 10 reps  ASSESSMENT:  CLINICAL IMPRESSION:  Pt arrives today doing well, we continued focus on ROM and strength as appropriate, Tolerated session well today. Heavy emphasize on L knee extension. Cues to for full L knee extension with step ups.    OBJECTIVE IMPAIRMENTS: Abnormal gait, decreased activity tolerance, decreased balance, decreased endurance, decreased mobility, difficulty walking, decreased ROM, decreased strength, hypomobility, increased fascial restrictions, impaired flexibility, improper body mechanics, postural dysfunction, and pain.   ACTIVITY LIMITATIONS: carrying, lifting, bending, sitting, standing, squatting, sleeping, stairs, transfers, bed mobility, bathing, toileting, dressing, hygiene/grooming, and locomotion level  PARTICIPATION LIMITATIONS: meal prep, cleaning, driving, shopping, community activity, and yard work  PERSONAL FACTORS: Age, Fitness, Past/current experiences, and Time since onset of injury/illness/exacerbation are also affecting patient's functional outcome.   REHAB POTENTIAL: Good  CLINICAL DECISION MAKING: Evolving/moderate complexity  EVALUATION COMPLEXITY: Moderate   GOALS: Goals reviewed with patient? Yes   SHORT TERM GOALS: Target date: 01/25/2024   Independent with initial HEP. Baseline: Newly provided Goal status: Met  02/01/24  2.  Pt will be able to perform 5x STS without keeping L foot in front of R Baseline: Increased weight on R  with L foot in front Goal status: Met  02/01/24  3.  Pt will have improved knee ROM to 10-90 deg Baseline: 35-68 deg in sitting Goal status: Met 02/01/24   LONG TERM GOALS: Target date:  02/22/2024   Independent with advanced/ongoing HEP to improve outcomes and carryover.  Baseline: Initial HEP provided Goal status: INITIAL  2.  Alfonso Patten will demonstrate knee ROM from 5 to 120 deg to ascend/descend stairs. Baseline:  Goal status: Ongoing 02/01/24  3.  Alfonso Patten  will be able to ambulate 1000' safely with LRAD and normal gait pattern to access community.  Baseline:  Goal status: Met 02/01/24  4.  Alfonso Patten will have improved LEFS to >/=42.5%  Baseline: 32.5% Goal status: INITIAL  5.  DAYNA GEURTS will demonstrate > 19/24 on DGI to demonstrate decreased risk of falls.   Baseline: 14/24 Goal status: INITIAL  6.  Alfonso Patten will demonstrate improved functional LE strength by completing 5x STS in <15 seconds.  (MCID 5 seconds) Baseline: 23 sec Goal status: INITIAL   PLAN:  PT FREQUENCY: 2x/week  PT DURATION: 8 weeks  PLANNED INTERVENTIONS: 97164- PT Re-evaluation, 97110-Therapeutic exercises, 97530- Therapeutic activity, [id number]- Neuromuscular re-education, 97535- Self Care, [id number]- Manual therapy, [id number]- Gait training, [phone]- Aquatic Therapy, 97014- Electrical stimulation (unattended), 97016- Vasopneumatic device, 97035- Ultrasound, 52841- Ionotophoresis 4mg /ml Dexamethasone, Patient/Family education, Balance training, Stair training, Taping, Dry Needling, Joint mobilization, Spinal mobilization, Scar mobilization, DME instructions, Cryotherapy, and Moist heat  PLAN FOR NEXT SESSION: L knee ROM and strength, try to get to 105* or better prior to next MD appt on 02/28/24  Nedra Hai, PT, DPT 02/08/24 12:59 PM

## 2024-02-12 ENCOUNTER — Encounter: Payer: Self-pay | Admitting: Physical Therapy

## 2024-02-12 ENCOUNTER — Ambulatory Visit: Payer: Medicare Other | Admitting: Physical Therapy

## 2024-02-12 DIAGNOSIS — M6281 Muscle weakness (generalized): Secondary | ICD-10-CM

## 2024-02-12 DIAGNOSIS — M25662 Stiffness of left knee, not elsewhere classified: Secondary | ICD-10-CM

## 2024-02-12 DIAGNOSIS — R262 Difficulty in walking, not elsewhere classified: Secondary | ICD-10-CM | POA: Diagnosis not present

## 2024-02-12 DIAGNOSIS — M25562 Pain in left knee: Secondary | ICD-10-CM | POA: Diagnosis not present

## 2024-02-12 DIAGNOSIS — R600 Localized edema: Secondary | ICD-10-CM

## 2024-02-12 NOTE — Therapy (Signed)
 OUTPATIENT PHYSICAL THERAPY LOWER EXTREMITY TREATMENT        Patient Name: Peter Blair MRN: [id number] DOB:31-Jul-1941, 83 y.o., male Today's Date: 02/12/2024  END OF SESSION:  PT End of Session - 02/12/24 1302     Visit Number 13    Date for PT Re-Evaluation 02/22/24    PT Start Time 1300    PT Stop Time 1345    PT Time Calculation (min) 45 min    Activity Tolerance Patient tolerated treatment well    Behavior During Therapy Peter Blair for tasks assessed/performed                Past Medical History:  Diagnosis Date   Coronary artery disease    exertional chest pain, cath 12/10 showed EF 50-55% with mild inferior hypokensis, 99% proximal and 60% distal RCA stenosis, 80% prox stenosis of a moderate sized D1, 40% prox LAD. Patient had BMS x2 placed in RCA. Unstable Angina. PROMUS DES to the prox RCA   Diabetes mellitus without complication (HCC)    Erectile dysfunction    GERD (gastroesophageal reflux disease)    Hyperlipidemia    Hypertension    Nephrolithiasis    Dr. Achilles Blair   Renal cyst    Right shoulder pain    rotator cuff   Skin cancer    Skin cancer, PMH of, cell type unknown, Dr. Margo Blair   Past Surgical History:  Procedure Laterality Date   CATARACT EXTRACTION W/ INTRAOCULAR LENS IMPLANT Bilateral    colonoscopy with ploypectomy  03/05/2012    Dr Peter Blair, GI   CORONARY ANGIOPLASTY WITH STENT PLACEMENT     X 3 total stents   cystoscopic removal  04/04/2010   CYSTOSCOPY N/A 12/15/2023   Procedure: CYSTOSCOPY FLEXIBLE; URETHERAL DILATION; INSERTION OF FOLEY CATHETER;  Surgeon: Peter Fat, MD;  Location: [facility];  Service: Urology;  Laterality: N/A;   Flexibe sigmoidoscopy   12/06/1999   LITHOTRIPSY     x 1   RECONSTRUCTION OF EYELID Left    TOTAL KNEE ARTHROPLASTY Left 12/15/2023   Procedure: LEFT TOTAL KNEE ARTHROPLASTY;  Surgeon: Peter Hitch, MD;  Location: [facility];  Service: Orthopedics;  Laterality: Left;   Patient Active Problem List    Diagnosis Date Noted   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/15/2023   Unilateral primary osteoarthritis, right hip 09/25/2023   Actinic keratosis 10/17/2018   Type 2 diabetes mellitus with other circulatory complications (HCC) 12/11/2017   Osteoarthritis, multiple sites 10/11/2017   Preventative health care 08/05/2015   Advance directive discussed with patient 08/05/2015   GERD (gastroesophageal reflux disease)    Hyperplastic colonic polyp 12/17/2013   Benign prostatic hyperplasia with urinary obstruction 01/16/2013   ED (erectile dysfunction) of organic origin 01/16/2013   Coronary atherosclerosis of native coronary artery 11/20/2009   NEPHROLITHIASIS 09/29/2009   RENAL CYST, RIGHT 09/29/2009   Hyperlipemia 09/16/2008   SKIN CANCER, HX OF 09/16/2008   Essential hypertension, benign 07/09/2007    PCP: Peter Schwalbe, MD  REFERRING PROVIDER: [name], MD  REFERRING DIAG:  [phone] (ICD-10-CM) - Unilateral primary osteoarthritis, left knee   THERAPY DIAG:  Stiffness of left knee, not elsewhere classified  Localized edema  Difficulty in walking, not elsewhere classified  Muscle weakness (generalized)  Rationale for Evaluation and Treatment: Rehabilitation  ONSET DATE: 12/15/23  SUBJECTIVE:   SUBJECTIVE STATEMENT:  "Pretty good"   PERTINENT HISTORY: R hip OA  PAIN:  Are you having pain? No 0/10   PRECAUTIONS:  Fall  RED FLAGS: None   WEIGHT BEARING RESTRICTIONS: No  FALLS:  Has patient fallen in last 6 months? Yes. Number of falls 1 in September. Fell in a ditch trying to chainsaw a tree that fell in the street  LIVING ENVIRONMENT: Lives with: lives with their spouse, Peter Blair Lives in: House/apartment Stairs: Yes: External: 3 steps; on right going up Has following equipment at home: [name] - 2 wheeled  OCCUPATION: Retired - likes to fish  PLOF: Independent  PATIENT GOALS: Get back to fishing and normal activities  NEXT MD  VISIT: 02/28/24  OBJECTIVE:  Note: Objective measures were completed at Evaluation unless otherwise noted.  DIAGNOSTIC FINDINGS: xray 12/15/23 IMPRESSION: Interval total left knee arthroplasty without evidence of hardware failure.  PATIENT SURVEYS:  Lower Extremity Functional Score: 26 / 80 = 32.5 %  COGNITION: Overall cognitive status: Within functional limits for tasks assessed     SENSATION: WFL  EDEMA:  L knee 50% bigger than R LE  MUSCLE LENGTH: Hamstrings: limited due to knee ROM on L Thomas test: Did not assess   LOWER EXTREMITY ROM:  Active ROM Right eval Left eval Left 01/04/24 Left 01/15/24 Left 02/01/24 Left 02/05/24 02/12/24 Left AROM  Hip flexion         Hip extension         Hip abduction         Hip adduction         Hip internal rotation         Hip external rotation         Knee flexion 125 68 90 87* AAROM, only able to get to 70* AROM due to pain  95 AAROM 106*, AROM 98* 102  Knee extension -5 -35 -12 -6* AROM supine  10 6* seated quad set AROM with leg propped on PT's knee  9  Ankle dorsiflexion         Ankle plantarflexion         Ankle inversion         Ankle eversion          (Blank rows = not tested)  LOWER EXTREMITY MMT:  MMT Right eval Left eval  Hip flexion 4+ 3+  Hip extension    Hip abduction 4 4-  Hip adduction 5 5  Hip internal rotation    Hip external rotation    Knee flexion 5 3+  Knee extension 5 3- (limited due to ROM)  Ankle dorsiflexion    Ankle plantarflexion    Ankle inversion    Ankle eversion     (Blank rows = not tested)  FUNCTIONAL TESTS:  5 times sit to stand: L LE forward with bilat UE support; 23 sec Dynamic Gait Index: 14/24     GAIT: Distance walked: In clinic during DGI Assistive device utilized: Walker - 2 wheeled Level of assistance: Modified independence Comments: even step lengths bilat but diminished knee flexion during swing phase and limited knee ext during stance phase  TREATMENT DATE:  02/12/24 NuStep L5 x 5 min  L knee PROM   L tibiofemoral Jt mobs L knee flex & Ext stretch HS curls 35lb 2x10, LLE 20lb x10 Leg Ext LLE 10lb 2x10 4in box on airex forward & Lateral step ups x10 each Heel raises black bar 2x15 Leg press 80lb 2x10, LLE 40lb 2x10  02/08/24 NuStep L5 x 6 min HS curls 35lb 2x12 Leg Ext 10lb 2x12 Sit to stand holding 20lb dumbbell 2x10  Leg press 70lb 2x10 6in step ups x10 each   L knee PROM   L tibiofemoral Jt mobs L knee flex & Ext stretch 30lb resisted gait 4 way x 3 each  02/05/24 Nustep L5 x8 minutes for w/u and ROM Knee flexion stretch 12 inch step 12 x10 seconds HS stretch on steps 3x30 seconds  Bike seat 15 x3 minutes full rotations, then seat 13 full rotations x3 minutes  Knee flexion self AAROM x12 ROM check Forward step ups 6 inch step 2x12 L LE  Forward step downs partial range 6 inch step 2x12 L LE   Patella mobs all directions grade III Knee extension overpressure    02/01/24 NuStep L5 x6 min GOALS L knee PROM   L tibiofemoral Jt mobs L knee flex & Ext stretch Leg press 40lb 2x10 6in step ups x10 each Heel raises 2x10 HS curls 35lb 2x10 Led Ext 10lb 2x10 S2S holding blue ball 2x10  on airex  01/30/24 Nustep L 5 6 min Bike seat 15 2 min full rev then up to seat 14 3 min Hs curls 25lb 2x10 BIL LLE 15# 10 x Leg ext 10lb 2x10 BIL LLE 5# 10 x S2S holding blue ball 2x10 - 1 set on airex 6in step ups x10 each  Leg press 20# LLE 2 sets 10 4 inch step down 10 x with UE  L knee PROM   L tibiofemoral Jt mobs L knee flex & Ext stretch  A/Pass sitting after stretching    3/2-  92/106 L knee contract relax for flex  01/22/24 Bike L3 partial revolutions x 4 min NuStep L5 x4 min LE only Resisted gait 30lb 4 way x 3 each 6in step ups x10 each  Hs curls 25lb 2x10 Leg ext 10lb 2x10 S2S holding blue ball  2x10   L knee PROM   L tibiofemoral Jt mobs L knee flex & Ext stretch L knee contract relax for flex  01/18/24 NuStep L 5 x 6 min HS curls 25lb 2x10 Leg Ext 5lb 2x10 Leg press 30lb 2x10 L knee PROM   L tibiofemoral Jt mobs L knee flex & Ext stretch LLE LAQ 3lb 2x10 LLE HS curls green 2x10 Slant board calf stretch   PATIENT EDUCATION:  Education details: Exam findings, POC, initial HEP Person educated: Patient Education method: Explanation, Demonstration, and Handouts Education comprehension: verbalized understanding, returned demonstration, and needs further education  HOME EXERCISE PROGRAM: Access Code: [email or web address] URL: [email or web address] Date: 01/04/2024 Prepared by: Vernon Prey April Kirstie Peri  Exercises - Standing Knee Flexion Stretch on Step  - 1 x daily - 7 x weekly - 2 sets - 10 reps - Long Sitting Quad Set with Towel Roll Under Heel  - 1 x daily - 7 x weekly - 2 sets - 10 reps - Supine Hamstring Stretch with Strap  - 1 x daily - 7 x weekly - 2 sets - 30 sec hold - Squat with Chair Touch  - 1 x daily - 7 x weekly -  3 sets - 10 reps - Step Up  - 1 x daily - 7 x weekly - 2 sets - 10 reps - Lateral Step Up  - 1 x daily - 7 x weekly - 2 sets - 10 reps - Forward Step Down Touch with Heel  - 1 x daily - 7 x weekly - 2 sets - 10 reps  ASSESSMENT:  CLINICAL IMPRESSION:  Pt arrives today doing well, we continued focus on ROM and strength as appropriate. He has progressed increasing his L knee AROM.  Considering the increase in motion he is still lacking L knee extension. Tolerated session well today. Heavy emphasize on L knee extension. Cues to for full L knee extension with step ups. Some end range pain with passive ext.   OBJECTIVE IMPAIRMENTS: Abnormal gait, decreased activity tolerance, decreased balance, decreased endurance, decreased mobility, difficulty walking, decreased ROM, decreased strength, hypomobility, increased fascial restrictions, impaired  flexibility, improper body mechanics, postural dysfunction, and pain.   ACTIVITY LIMITATIONS: carrying, lifting, bending, sitting, standing, squatting, sleeping, stairs, transfers, bed mobility, bathing, toileting, dressing, hygiene/grooming, and locomotion level  PARTICIPATION LIMITATIONS: meal prep, cleaning, driving, shopping, community activity, and yard work  PERSONAL FACTORS: Age, Fitness, Past/current experiences, and Time since onset of injury/illness/exacerbation are also affecting patient's functional outcome.   REHAB POTENTIAL: Good  CLINICAL DECISION MAKING: Evolving/moderate complexity  EVALUATION COMPLEXITY: Moderate   GOALS: Goals reviewed with patient? Yes   SHORT TERM GOALS: Target date: 01/25/2024   Independent with initial HEP. Baseline: Newly provided Goal status: Met  02/01/24  2.  Pt will be able to perform 5x STS without keeping L foot in front of R Baseline: Increased weight on R with L foot in front Goal status: Met  02/01/24  3.  Pt will have improved knee ROM to 10-90 deg Baseline: 35-68 deg in sitting Goal status: Met 02/01/24   LONG TERM GOALS: Target date:  02/22/2024   Independent with advanced/ongoing HEP to improve outcomes and carryover.  Baseline: Initial HEP provided Goal status: INITIAL  2.  Alfonso Patten will demonstrate knee ROM from 5 to 120 deg to ascend/descend stairs. Baseline:  Goal status: Progressing 02/12/24  3.  KOI ZANGARA will be able to ambulate 1000' safely with LRAD and normal gait pattern to access community.  Baseline:  Goal status: Met 02/01/24  4.  Alfonso Patten will have improved LEFS to >/=42.5%  Baseline: 32.5% Goal status: INITIAL  5.  MERRIT FRIESEN will demonstrate > 19/24 on DGI to demonstrate decreased risk of falls.   Baseline: 14/24 Goal status: INITIAL  6.  Alfonso Patten will demonstrate improved functional LE strength by completing 5x STS in <15 seconds.  (MCID 5 seconds) Baseline: 23  sec Goal status: INITIAL   PLAN:  PT FREQUENCY: 2x/week  PT DURATION: 8 weeks  PLANNED INTERVENTIONS: 97164- PT Re-evaluation, 97110-Therapeutic exercises, 97530- Therapeutic activity, [id number]- Neuromuscular re-education, 97535- Self Care, 16109- Manual therapy, [id number]- Gait training, [phone]- Aquatic Therapy, 97014- Electrical stimulation (unattended), 97016- Vasopneumatic device, [id number]- Ultrasound, 09811- Ionotophoresis 4mg /ml Dexamethasone, Patient/Family education, Balance training, Stair training, Taping, Dry Needling, Joint mobilization, Spinal mobilization, Scar mobilization, DME instructions, Cryotherapy, and Moist heat  PLAN FOR NEXT SESSION: L knee ROM and strength, try to get to 105* or better prior to next MD appt on 02/28/24  Nedra Hai, PT, DPT 02/12/24 1:02 PM

## 2024-02-15 ENCOUNTER — Encounter: Payer: Self-pay | Admitting: Physical Therapy

## 2024-02-15 ENCOUNTER — Ambulatory Visit: Payer: Medicare Other | Admitting: Physical Therapy

## 2024-02-15 DIAGNOSIS — M6281 Muscle weakness (generalized): Secondary | ICD-10-CM | POA: Diagnosis not present

## 2024-02-15 DIAGNOSIS — M25562 Pain in left knee: Secondary | ICD-10-CM

## 2024-02-15 DIAGNOSIS — M25662 Stiffness of left knee, not elsewhere classified: Secondary | ICD-10-CM | POA: Diagnosis not present

## 2024-02-15 DIAGNOSIS — R600 Localized edema: Secondary | ICD-10-CM | POA: Diagnosis not present

## 2024-02-15 DIAGNOSIS — R262 Difficulty in walking, not elsewhere classified: Secondary | ICD-10-CM

## 2024-02-15 NOTE — Therapy (Signed)
 OUTPATIENT PHYSICAL THERAPY LOWER EXTREMITY TREATMENT        Patient Name: LATHANIEL LEGATE MRN: [id number] DOB:03-Dec-1941, 83 y.o., male Today's Date: 02/15/2024  END OF SESSION:  PT End of Session - 02/15/24 1300     Visit Number 14    Number of Visits 17    Date for PT Re-Evaluation 02/22/24    Authorization Type [employer]    Progress Note Due on Visit 20    PT Start Time 1302    PT Stop Time 1341    PT Time Calculation (min) 39 min    Activity Tolerance Patient tolerated treatment well    Behavior During Therapy WFL for tasks assessed/performed                 Past Medical History:  Diagnosis Date   Coronary artery disease    exertional chest pain, cath 12/10 showed EF 50-55% with mild inferior hypokensis, 99% proximal and 60% distal RCA stenosis, 80% prox stenosis of a moderate sized D1, 40% prox LAD. Patient had BMS x2 placed in RCA. Unstable Angina. PROMUS DES to the prox RCA   Diabetes mellitus without complication (HCC)    Erectile dysfunction    GERD (gastroesophageal reflux disease)    Hyperlipidemia    Hypertension    Nephrolithiasis    Dr. Achilles Dunk   Renal cyst    Right shoulder pain    rotator cuff   Skin cancer    Skin cancer, PMH of, cell type unknown, Dr. Margo Aye   Past Surgical History:  Procedure Laterality Date   CATARACT EXTRACTION W/ INTRAOCULAR LENS IMPLANT Bilateral    colonoscopy with ploypectomy  03/05/2012    Dr Jarold Motto, GI   CORONARY ANGIOPLASTY WITH STENT PLACEMENT     X 3 total stents   cystoscopic removal  04/04/2010   CYSTOSCOPY N/A 12/15/2023   Procedure: CYSTOSCOPY FLEXIBLE; URETHERAL DILATION; INSERTION OF FOLEY CATHETER;  Surgeon: [name], MD;  Location: [facility];  Service: Urology;  Laterality: N/A;   Flexibe sigmoidoscopy   12/06/1999   LITHOTRIPSY     x 1   RECONSTRUCTION OF EYELID Left    TOTAL KNEE ARTHROPLASTY Left 12/15/2023   Procedure: LEFT TOTAL KNEE ARTHROPLASTY;  Surgeon: [name],  MD;  Location: [facility];  Service: Orthopedics;  Laterality: Left;   Patient Active Problem List   Diagnosis Date Noted   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/15/2023   Unilateral primary osteoarthritis, right hip 09/25/2023   Actinic keratosis 10/17/2018   Type 2 diabetes mellitus with other circulatory complications (HCC) 12/11/2017   Osteoarthritis, multiple sites 10/11/2017   Preventative health care 08/05/2015   Advance directive discussed with patient 08/05/2015   GERD (gastroesophageal reflux disease)    Hyperplastic colonic polyp 12/17/2013   Benign prostatic hyperplasia with urinary obstruction 01/16/2013   ED (erectile dysfunction) of organic origin 01/16/2013   Coronary atherosclerosis of native coronary artery 11/20/2009   NEPHROLITHIASIS 09/29/2009   RENAL CYST, RIGHT 09/29/2009   Hyperlipemia 09/16/2008   SKIN CANCER, HX OF 09/16/2008   Essential hypertension, benign 07/09/2007    PCP: [name], MD  REFERRING PROVIDER: [name], MD  REFERRING DIAG:  [phone] (ICD-10-CM) - Unilateral primary osteoarthritis, left knee   THERAPY DIAG:  Stiffness of left knee, not elsewhere classified  Localized edema  Difficulty in walking, not elsewhere classified  Muscle weakness (generalized)  Acute pain of left knee  Rationale for Evaluation and Treatment: Rehabilitation  ONSET  DATE: 12/15/23  SUBJECTIVE:   SUBJECTIVE STATEMENT:   Sore from last time, Ron worked me pretty good last time. R hip is bothering me again a bit.   PERTINENT HISTORY: R hip OA  PAIN:  Are you having pain? Yes: NPRS scale: 1/10 Pain location: L knee  Pain description: general soreness/discomfort  Aggravating factors: unsure  Relieving factors: unsure   PRECAUTIONS: Fall  RED FLAGS: None   WEIGHT BEARING RESTRICTIONS: No  FALLS:  Has patient fallen in last 6 months? Yes. Number of falls 1 in September. Fell in a ditch trying to chainsaw a tree that  fell in the street  LIVING ENVIRONMENT: Lives with: lives with their spouse, [name] Lives in: House/apartment Stairs: Yes: External: 3 steps; on right going up Has following equipment at home: [name] - 2 wheeled  OCCUPATION: Retired - likes to fish  PLOF: Independent  PATIENT GOALS: Get back to fishing and normal activities  NEXT MD VISIT: 02/28/24  OBJECTIVE:  Note: Objective measures were completed at Evaluation unless otherwise noted.  DIAGNOSTIC FINDINGS: xray 12/15/23 IMPRESSION: Interval total left knee arthroplasty without evidence of hardware failure.  PATIENT SURVEYS:  Lower Extremity Functional Score: 26 / 80 = 32.5 %  COGNITION: Overall cognitive status: Within functional limits for tasks assessed     SENSATION: WFL  EDEMA:  L knee 50% bigger than R LE  MUSCLE LENGTH: Hamstrings: limited due to knee ROM on L Thomas test: Did not assess   LOWER EXTREMITY ROM:  Active ROM Right eval Left eval Left 01/04/24 Left 01/15/24 Left 02/01/24 Left 02/05/24 02/12/24 Left AROM  Hip flexion         Hip extension         Hip abduction         Hip adduction         Hip internal rotation         Hip external rotation         Knee flexion 125 68 90 87* AAROM, only able to get to 70* AROM due to pain  95 AAROM 106*, AROM 98* 102  Knee extension -5 -35 -12 -6* AROM supine  10 6* seated quad set AROM with leg propped on PT's knee  9  Ankle dorsiflexion         Ankle plantarflexion         Ankle inversion         Ankle eversion          (Blank rows = not tested)  LOWER EXTREMITY MMT:  MMT Right eval Left eval  Hip flexion 4+ 3+  Hip extension    Hip abduction 4 4-  Hip adduction 5 5  Hip internal rotation    Hip external rotation    Knee flexion 5 3+  Knee extension 5 3- (limited due to ROM)  Ankle dorsiflexion    Ankle plantarflexion    Ankle inversion    Ankle eversion     (Blank rows = not tested)  FUNCTIONAL TESTS:  5 times sit to stand: L LE  forward with bilat UE support; 23 sec Dynamic Gait Index: 14/24     GAIT: Distance walked: In clinic during DGI Assistive device utilized: Walker - 2 wheeled Level of assistance: Modified independence Comments: even step lengths bilat but diminished knee flexion during swing phase and limited knee ext during stance phase  TREATMENT DATE:  02/15/24  Nustep L5x6 minutes BLEs only for ROM/warm up  Scifit bike seat 14 full rotations x6 minutes for ROM  HS stretches L LE 3x30 seconds  Knee flexion stretch on steps 12x10 seconds  Quad stretch on stairs 3x30 seconds L  Knee extension stretch with pad under ankles 3# x3 min  Bridges with progressive knee flexion 3x5   Knee extension OP with pad under ankle  Percussion gun to L quad + knee flexion OP seated      02/12/24 NuStep L5 x 5 min  L knee PROM   L tibiofemoral Jt mobs L knee flex & Ext stretch HS curls 35lb 2x10, LLE 20lb x10 Leg Ext LLE 10lb 2x10 4in box on airex forward & Lateral step ups x10 each Heel raises black bar 2x15 Leg press 80lb 2x10, LLE 40lb 2x10  02/08/24 NuStep L5 x 6 min HS curls 35lb 2x12 Leg Ext 10lb 2x12 Sit to stand holding 20lb dumbbell 2x10  Leg press 70lb 2x10 6in step ups x10 each   L knee PROM   L tibiofemoral Jt mobs L knee flex & Ext stretch 30lb resisted gait 4 way x 3 each  02/05/24 Nustep L5 x8 minutes for w/u and ROM Knee flexion stretch 12 inch step 12 x10 seconds HS stretch on steps 3x30 seconds  Bike seat 15 x3 minutes full rotations, then seat 13 full rotations x3 minutes  Knee flexion self AAROM x12 ROM check Forward step ups 6 inch step 2x12 L LE  Forward step downs partial range 6 inch step 2x12 L LE   Patella mobs all directions grade III Knee extension overpressure      PATIENT EDUCATION:  Education details: Exam findings, POC, initial  HEP Person educated: Patient Education method: Explanation, Demonstration, and Handouts Education comprehension: verbalized understanding, returned demonstration, and needs further education  HOME EXERCISE PROGRAM: Access Code: [email or web address] URL: [email or web address] Date: 01/04/2024 Prepared by: Vernon Prey April Kirstie Peri  Exercises - Standing Knee Flexion Stretch on Step  - 1 x daily - 7 x weekly - 2 sets - 10 reps - Long Sitting Quad Set with Towel Roll Under Heel  - 1 x daily - 7 x weekly - 2 sets - 10 reps - Supine Hamstring Stretch with Strap  - 1 x daily - 7 x weekly - 2 sets - 30 sec hold - Squat with Chair Touch  - 1 x daily - 7 x weekly - 3 sets - 10 reps - Step Up  - 1 x daily - 7 x weekly - 2 sets - 10 reps - Lateral Step Up  - 1 x daily - 7 x weekly - 2 sets - 10 reps - Forward Step Down Touch with Heel  - 1 x daily - 7 x weekly - 2 sets - 10 reps  ASSESSMENT:  CLINICAL IMPRESSION:  Pt arrives today doing well, still a bit sore from last session. Continued working on extension ROM and general functional strengthening, also tried use of percussion gun along with knee flexion OP to try to improve flexion ROM today/reduce mm guarding and tone.  Doing really well overall and making progress towards goals.    OBJECTIVE IMPAIRMENTS: Abnormal gait, decreased activity tolerance, decreased balance, decreased endurance, decreased mobility, difficulty walking, decreased ROM, decreased strength, hypomobility, increased fascial restrictions, impaired flexibility, improper body mechanics, postural dysfunction, and pain.   ACTIVITY LIMITATIONS: carrying, lifting, bending, sitting, standing, squatting, sleeping, stairs, transfers, bed mobility, bathing, toileting, dressing, hygiene/grooming, and  locomotion level  PARTICIPATION LIMITATIONS: meal prep, cleaning, driving, shopping, community activity, and yard work  PERSONAL FACTORS: Age, Fitness, Past/current experiences, and  Time since onset of injury/illness/exacerbation are also affecting patient's functional outcome.   REHAB POTENTIAL: Good  CLINICAL DECISION MAKING: Evolving/moderate complexity  EVALUATION COMPLEXITY: Moderate   GOALS: Goals reviewed with patient? Yes   SHORT TERM GOALS: Target date: 01/25/2024   Independent with initial HEP. Baseline: Newly provided Goal status: Met  02/01/24  2.  Pt will be able to perform 5x STS without keeping L foot in front of R Baseline: Increased weight on R with L foot in front Goal status: Met  02/01/24  3.  Pt will have improved knee ROM to 10-90 deg Baseline: 35-68 deg in sitting Goal status: Met 02/01/24   LONG TERM GOALS: Target date:  02/22/2024   Independent with advanced/ongoing HEP to improve outcomes and carryover.  Baseline: Initial HEP provided Goal status: INITIAL  2.  Alfonso Patten will demonstrate knee ROM from 5 to 120 deg to ascend/descend stairs. Baseline:  Goal status: Progressing 02/12/24  3.  JAYQUAN BRADSHER will be able to ambulate 1000' safely with LRAD and normal gait pattern to access community.  Baseline:  Goal status: Met 02/01/24  4.  Alfonso Patten will have improved LEFS to >/=42.5%  Baseline: 32.5% Goal status: INITIAL  5.  DAMAR PETIT will demonstrate > 19/24 on DGI to demonstrate decreased risk of falls.   Baseline: 14/24 Goal status: INITIAL  6.  Alfonso Patten will demonstrate improved functional LE strength by completing 5x STS in <15 seconds.  (MCID 5 seconds) Baseline: 23 sec Goal status: INITIAL   PLAN:  PT FREQUENCY: 2x/week  PT DURATION: 8 weeks  PLANNED INTERVENTIONS: 97164- PT Re-evaluation, 97110-Therapeutic exercises, 97530- Therapeutic activity, [id number]- Neuromuscular re-education, 97535- Self Care, 09811- Manual therapy, [id number]- Gait training, [phone]- Aquatic Therapy, 97014- Electrical stimulation (unattended), 97016- Vasopneumatic device, 97035- Ultrasound, 29562- Ionotophoresis 4mg /ml  Dexamethasone, Patient/Family education, Balance training, Stair training, Taping, Dry Needling, Joint mobilization, Spinal mobilization, Scar mobilization, DME instructions, Cryotherapy, and Moist heat  PLAN FOR NEXT SESSION: L knee ROM and strength, try to get to 105* or better prior to next MD appt on 02/28/24; continue work on ROM gains    Nedra Hai, PT, DPT 02/15/24 1:43 PM

## 2024-02-19 ENCOUNTER — Ambulatory Visit: Payer: Medicare Other | Admitting: Physical Therapy

## 2024-02-19 ENCOUNTER — Encounter: Payer: Self-pay | Admitting: Physical Therapy

## 2024-02-19 DIAGNOSIS — M25662 Stiffness of left knee, not elsewhere classified: Secondary | ICD-10-CM | POA: Diagnosis not present

## 2024-02-19 DIAGNOSIS — R262 Difficulty in walking, not elsewhere classified: Secondary | ICD-10-CM

## 2024-02-19 DIAGNOSIS — R600 Localized edema: Secondary | ICD-10-CM

## 2024-02-19 DIAGNOSIS — M25562 Pain in left knee: Secondary | ICD-10-CM | POA: Diagnosis not present

## 2024-02-19 DIAGNOSIS — M6281 Muscle weakness (generalized): Secondary | ICD-10-CM

## 2024-02-19 NOTE — Therapy (Signed)
 OUTPATIENT PHYSICAL THERAPY LOWER EXTREMITY TREATMENT        Patient Name: Peter Blair MRN: [id number] DOB:1940-12-21, 83 y.o., male Today's Date: 02/19/2024  END OF SESSION:  PT End of Session - 02/19/24 1259     Visit Number 15    Date for PT Re-Evaluation 02/22/24    PT Start Time 1300    PT Stop Time 1345    PT Time Calculation (min) 45 min    Activity Tolerance Patient tolerated treatment well    Behavior During Therapy [facility] for tasks assessed/performed                 Past Medical History:  Diagnosis Date   Coronary artery disease    exertional chest pain, cath 12/10 showed EF 50-55% with mild inferior hypokensis, 99% proximal and 60% distal RCA stenosis, 80% prox stenosis of a moderate sized D1, 40% prox LAD. Patient had BMS x2 placed in RCA. Unstable Angina. PROMUS DES to the prox RCA   Diabetes mellitus without complication (HCC)    Erectile dysfunction    GERD (gastroesophageal reflux disease)    Hyperlipidemia    Hypertension    Nephrolithiasis    Dr. Achilles Dunk   Renal cyst    Right shoulder pain    rotator cuff   Skin cancer    Skin cancer, PMH of, cell type unknown, Dr. Margo Aye   Past Surgical History:  Procedure Laterality Date   CATARACT EXTRACTION W/ INTRAOCULAR LENS IMPLANT Bilateral    colonoscopy with ploypectomy  03/05/2012    Dr Jarold Motto, GI   CORONARY ANGIOPLASTY WITH STENT PLACEMENT     X 3 total stents   cystoscopic removal  04/04/2010   CYSTOSCOPY N/A 12/15/2023   Procedure: CYSTOSCOPY FLEXIBLE; URETHERAL DILATION; INSERTION OF FOLEY CATHETER;  Surgeon: [name], MD;  Location: [facility];  Service: Urology;  Laterality: N/A;   Flexibe sigmoidoscopy   12/06/1999   LITHOTRIPSY     x 1   RECONSTRUCTION OF EYELID Left    TOTAL KNEE ARTHROPLASTY Left 12/15/2023   Procedure: LEFT TOTAL KNEE ARTHROPLASTY;  Surgeon: [name], MD;  Location: [facility];  Service: Orthopedics;  Laterality: Left;   Patient Active Problem List    Diagnosis Date Noted   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/15/2023   Unilateral primary osteoarthritis, right hip 09/25/2023   Actinic keratosis 10/17/2018   Type 2 diabetes mellitus with other circulatory complications (HCC) 12/11/2017   Osteoarthritis, multiple sites 10/11/2017   Preventative health care 08/05/2015   Advance directive discussed with patient 08/05/2015   GERD (gastroesophageal reflux disease)    Hyperplastic colonic polyp 12/17/2013   Benign prostatic hyperplasia with urinary obstruction 01/16/2013   ED (erectile dysfunction) of organic origin 01/16/2013   Coronary atherosclerosis of native coronary artery 11/20/2009   NEPHROLITHIASIS 09/29/2009   RENAL CYST, RIGHT 09/29/2009   Hyperlipemia 09/16/2008   SKIN CANCER, HX OF 09/16/2008   Essential hypertension, benign 07/09/2007    PCP: [name], MD  REFERRING PROVIDER: [name], MD  REFERRING DIAG:  [phone] (ICD-10-CM) - Unilateral primary osteoarthritis, left knee   THERAPY DIAG:  Localized edema  Difficulty in walking, not elsewhere classified  Muscle weakness (generalized)  Stiffness of left knee, not elsewhere classified  Acute pain of left knee  Rationale for Evaluation and Treatment: Rehabilitation  ONSET DATE: 12/15/23  SUBJECTIVE:   SUBJECTIVE STATEMENT:   Feeling pretty good  PERTINENT HISTORY: R hip OA  PAIN:  Are  you having pain? Yes: NPRS scale: 1/10 Pain location: L knee  Pain description: general soreness/discomfort  Aggravating factors: unsure  Relieving factors: unsure   PRECAUTIONS: Fall  RED FLAGS: None   WEIGHT BEARING RESTRICTIONS: No  FALLS:  Has patient fallen in last 6 months? Yes. Number of falls 1 in September. Fell in a ditch trying to chainsaw a tree that fell in the street  LIVING ENVIRONMENT: Lives with: lives with their spouse, [name] Lives in: House/apartment Stairs: Yes: External: 3 steps; on right going up Has  following equipment at home: [name] - 2 wheeled  OCCUPATION: Retired - likes to fish  PLOF: Independent  PATIENT GOALS: Get back to fishing and normal activities  NEXT MD VISIT: 02/28/24  OBJECTIVE:  Note: Objective measures were completed at Evaluation unless otherwise noted.  DIAGNOSTIC FINDINGS: xray 12/15/23 IMPRESSION: Interval total left knee arthroplasty without evidence of hardware failure.  PATIENT SURVEYS:  Lower Extremity Functional Score: 26 / 80 = 32.5 %  COGNITION: Overall cognitive status: Within functional limits for tasks assessed     SENSATION: WFL  EDEMA:  L knee 50% bigger than R LE  MUSCLE LENGTH: Hamstrings: limited due to knee ROM on L Thomas test: Did not assess   LOWER EXTREMITY ROM:  Active ROM Right eval Left eval Left 01/04/24 Left 01/15/24 Left 02/01/24 Left 02/05/24 02/12/24 Left AROM  Hip flexion         Hip extension         Hip abduction         Hip adduction         Hip internal rotation         Hip external rotation         Knee flexion 125 68 90 87* AAROM, only able to get to 70* AROM due to pain  95 AAROM 106*, AROM 98* 102  Knee extension -5 -35 -12 -6* AROM supine  10 6* seated quad set AROM with leg propped on PT's knee  9  Ankle dorsiflexion         Ankle plantarflexion         Ankle inversion         Ankle eversion          (Blank rows = not tested)  LOWER EXTREMITY MMT:  MMT Right eval Left eval  Hip flexion 4+ 3+  Hip extension    Hip abduction 4 4-  Hip adduction 5 5  Hip internal rotation    Hip external rotation    Knee flexion 5 3+  Knee extension 5 3- (limited due to ROM)  Ankle dorsiflexion    Ankle plantarflexion    Ankle inversion    Ankle eversion     (Blank rows = not tested)  FUNCTIONAL TESTS:  5 times sit to stand: L LE forward with bilat UE support; 23 sec Dynamic Gait Index: 14/24     GAIT: Distance walked: In clinic during DGI Assistive device utilized: Walker - 2 wheeled Level of  assistance: Modified independence Comments: even step lengths bilat but diminished knee flexion during swing phase and limited knee ext during stance phase  TREATMENT DATE: 02/19/24 NuStep L5 x 6 min Bike L3 x 3 min GOALS  5x sit to stand 11.35 sec  LEFS Leg press 60lb 2x12 8 in forward and Lateral step ups x 10 each  R hip weakness with lateral step ups  LLE HS curls 20lb 2x10 L knee PROM   L tibiofemoral Jt mobs L knee flex & Ext stretch   02/15/24  Nustep L5x6 minutes BLEs only for ROM/warm up  Scifit bike seat 14 full rotations x6 minutes for ROM  HS stretches L LE 3x30 seconds  Knee flexion stretch on steps 12x10 seconds  Quad stretch on stairs 3x30 seconds L  Knee extension stretch with pad under ankles 3# x3 min  Bridges with progressive knee flexion 3x5   Knee extension OP with pad under ankle  Percussion gun to L quad + knee flexion OP seated      02/12/24 NuStep L5 x 5 min L knee PROM   L tibiofemoral Jt mobs L knee flex & Ext stretch HS curls 35lb 2x10, LLE 20lb x10 Leg Ext LLE 10lb 2x10 4in box on airex forward & Lateral step ups x10 each Heel raises black bar 2x15 Leg press 80lb 2x10, LLE 40lb 2x10  02/08/24 NuStep L5 x 6 min HS curls 35lb 2x12 Leg Ext 10lb 2x12 Sit to stand holding 20lb dumbbell 2x10  Leg press 70lb 2x10 6in step ups x10 each   L knee PROM   L tibiofemoral Jt mobs L knee flex & Ext stretch 30lb resisted gait 4 way x 3 each  02/05/24 Nustep L5 x8 minutes for w/u and ROM Knee flexion stretch 12 inch step 12 x10 seconds HS stretch on steps 3x30 seconds  Bike seat 15 x3 minutes full rotations, then seat 13 full rotations x3 minutes  Knee flexion self AAROM x12 ROM check Forward step ups 6 inch step 2x12 L LE  Forward step downs partial range 6 inch step 2x12 L LE   Patella mobs all directions grade  III Knee extension overpressure      PATIENT EDUCATION:  Education details: Exam findings, POC, initial HEP Person educated: Patient Education method: Explanation, Demonstration, and Handouts Education comprehension: verbalized understanding, returned demonstration, and needs further education  HOME EXERCISE PROGRAM: Access Code: [email or web address] URL: [email or web address] Date: 01/04/2024 Prepared by: Vernon Prey April Kirstie Peri  Exercises - Standing Knee Flexion Stretch on Step  - 1 x daily - 7 x weekly - 2 sets - 10 reps - Long Sitting Quad Set with Towel Roll Under Heel  - 1 x daily - 7 x weekly - 2 sets - 10 reps - Supine Hamstring Stretch with Strap  - 1 x daily - 7 x weekly - 2 sets - 30 sec hold - Squat with Chair Touch  - 1 x daily - 7 x weekly - 3 sets - 10 reps - Step Up  - 1 x daily - 7 x weekly - 2 sets - 10 reps - Lateral Step Up  - 1 x daily - 7 x weekly - 2 sets - 10 reps - Forward Step Down Touch with Heel  - 1 x daily - 7 x weekly - 2 sets - 10 reps  ASSESSMENT:  CLINICAL IMPRESSION:  Pt arrives today doing well. Continued working on extension ROM and general functional strengthening. He has progressed meeting 5x sit to stand goal. Pt did report worst with his LEFS score. Doing really well overall and making progress towards goals. Some discomfort with at  end range with MT.   OBJECTIVE IMPAIRMENTS: Abnormal gait, decreased activity tolerance, decreased balance, decreased endurance, decreased mobility, difficulty walking, decreased ROM, decreased strength, hypomobility, increased fascial restrictions, impaired flexibility, improper body mechanics, postural dysfunction, and pain.   ACTIVITY LIMITATIONS: carrying, lifting, bending, sitting, standing, squatting, sleeping, stairs, transfers, bed mobility, bathing, toileting, dressing, hygiene/grooming, and locomotion level  PARTICIPATION LIMITATIONS: meal prep, cleaning, driving, shopping, community activity,  and yard work  PERSONAL FACTORS: Age, Fitness, Past/current experiences, and Time since onset of injury/illness/exacerbation are also affecting patient's functional outcome.   REHAB POTENTIAL: Good  CLINICAL DECISION MAKING: Evolving/moderate complexity  EVALUATION COMPLEXITY: Moderate   GOALS: Goals reviewed with patient? Yes   SHORT TERM GOALS: Target date: 01/25/2024   Independent with initial HEP. Baseline: Newly provided Goal status: Met  02/01/24  2.  Pt will be able to perform 5x STS without keeping L foot in front of R Baseline: Increased weight on R with L foot in front Goal status: Met  02/01/24  3.  Pt will have improved knee ROM to 10-90 deg Baseline: 35-68 deg in sitting Goal status: Met 02/01/24   LONG TERM GOALS: Target date:  02/22/2024   Independent with advanced/ongoing HEP to improve outcomes and carryover.  Baseline: Initial HEP provided Goal status: INITIAL  2.  Alfonso Patten will demonstrate knee ROM from 5 to 120 deg to ascend/descend stairs. Baseline:  Goal status: Progressing 02/12/24  3.  LOWELL MAKARA will be able to ambulate 1000' safely with LRAD and normal gait pattern to access community.  Baseline:  Goal status: Met 02/01/24  4.  DAYAN DESA will have improved LEFS to >/=42.5%  Baseline: 32.5% Goal status: Ongoing 53.8%  5.  JAVION HOLMER will demonstrate > 19/24 on DGI to demonstrate decreased risk of falls.   Baseline: 14/24 Goal status: INITIAL  6.  Alfonso Patten will demonstrate improved functional LE strength by completing 5x STS in <15 seconds.  (MCID 5 seconds) Baseline: 23 sec Goal status: Met 02/19/24 11.35 sec   PLAN:  PT FREQUENCY: 2x/week  PT DURATION: 8 weeks  PLANNED INTERVENTIONS: 97164- PT Re-evaluation, 97110-Therapeutic exercises, 97530- Therapeutic activity, 97112- Neuromuscular re-education, 97535- Self Care, 16109- Manual therapy, [id number]- Gait training, [phone]- Aquatic Therapy, 97014- Electrical  stimulation (unattended), 97016- Vasopneumatic device, 97035- Ultrasound, 09811- Ionotophoresis 4mg /ml Dexamethasone, Patient/Family education, Balance training, Stair training, Taping, Dry Needling, Joint mobilization, Spinal mobilization, Scar mobilization, DME instructions, Cryotherapy, and Moist heat  PLAN FOR NEXT SESSION: L knee ROM and strength, try to get to 105* or better prior to next MD appt on 02/28/24; continue work on ROM gains    Debroah Baller, PTA 02/19/24 1:02 PM

## 2024-02-22 ENCOUNTER — Ambulatory Visit: Payer: Medicare Other | Admitting: Physical Therapy

## 2024-02-22 ENCOUNTER — Encounter: Payer: Self-pay | Admitting: Physical Therapy

## 2024-02-22 DIAGNOSIS — M6281 Muscle weakness (generalized): Secondary | ICD-10-CM

## 2024-02-22 DIAGNOSIS — R262 Difficulty in walking, not elsewhere classified: Secondary | ICD-10-CM

## 2024-02-22 DIAGNOSIS — M25662 Stiffness of left knee, not elsewhere classified: Secondary | ICD-10-CM | POA: Diagnosis not present

## 2024-02-22 DIAGNOSIS — R600 Localized edema: Secondary | ICD-10-CM

## 2024-02-22 DIAGNOSIS — M25562 Pain in left knee: Secondary | ICD-10-CM | POA: Diagnosis not present

## 2024-02-22 NOTE — Therapy (Signed)
 OUTPATIENT PHYSICAL THERAPY LOWER EXTREMITY TREATMENT        Patient Name: Peter Blair MRN: [id number] DOB:November 15, 1941, 83 y.o., male Today's Date: 02/22/2024  END OF SESSION:  PT End of Session - 02/22/24 1301     Visit Number 16    Number of Visits 17    Date for PT Re-Evaluation 02/22/24    Authorization Type [employer]    Progress Note Due on Visit 20    PT Start Time 1302    PT Stop Time 1340    PT Time Calculation (min) 38 min    Activity Tolerance Patient tolerated treatment well    Behavior During Therapy WFL for tasks assessed/performed                  Past Medical History:  Diagnosis Date   Coronary artery disease    exertional chest pain, cath 12/10 showed EF 50-55% with mild inferior hypokensis, 99% proximal and 60% distal RCA stenosis, 80% prox stenosis of a moderate sized D1, 40% prox LAD. Patient had BMS x2 placed in RCA. Unstable Angina. PROMUS DES to the prox RCA   Diabetes mellitus without complication (HCC)    Erectile dysfunction    GERD (gastroesophageal reflux disease)    Hyperlipidemia    Hypertension    Nephrolithiasis    Dr. Achilles Dunk   Renal cyst    Right shoulder pain    rotator cuff   Skin cancer    Skin cancer, PMH of, cell type unknown, Dr. Margo Blair   Past Surgical History:  Procedure Laterality Date   CATARACT EXTRACTION W/ INTRAOCULAR LENS IMPLANT Bilateral    colonoscopy with ploypectomy  03/05/2012    Dr Jarold Motto, GI   CORONARY ANGIOPLASTY WITH STENT PLACEMENT     X 3 total stents   cystoscopic removal  04/04/2010   CYSTOSCOPY N/A 12/15/2023   Procedure: CYSTOSCOPY FLEXIBLE; URETHERAL DILATION; INSERTION OF FOLEY CATHETER;  Surgeon: Peter Fat, MD;  Location: [facility];  Service: Urology;  Laterality: N/A;   Flexibe sigmoidoscopy   12/06/1999   LITHOTRIPSY     x 1   RECONSTRUCTION OF EYELID Left    TOTAL KNEE ARTHROPLASTY Left 12/15/2023   Procedure: LEFT TOTAL KNEE ARTHROPLASTY;  Surgeon: Peter Hitch,  MD;  Location: [facility];  Service: Orthopedics;  Laterality: Left;   Patient Active Problem List   Diagnosis Date Noted   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/15/2023   Unilateral primary osteoarthritis, right hip 09/25/2023   Actinic keratosis 10/17/2018   Type 2 diabetes mellitus with other circulatory complications (HCC) 12/11/2017   Osteoarthritis, multiple sites 10/11/2017   Preventative health care 08/05/2015   Advance directive discussed with patient 08/05/2015   GERD (gastroesophageal reflux disease)    Hyperplastic colonic polyp 12/17/2013   Benign prostatic hyperplasia with urinary obstruction 01/16/2013   ED (erectile dysfunction) of organic origin 01/16/2013   Coronary atherosclerosis of native coronary artery 11/20/2009   NEPHROLITHIASIS 09/29/2009   RENAL CYST, RIGHT 09/29/2009   Hyperlipemia 09/16/2008   SKIN CANCER, HX OF 09/16/2008   Essential hypertension, benign 07/09/2007    PCP: Peter Schwalbe, MD  REFERRING PROVIDER: [name], MD  REFERRING DIAG:  [id number] (ICD-10-CM) - Unilateral primary osteoarthritis, left knee   THERAPY DIAG:  Localized edema  Difficulty in walking, not elsewhere classified  Muscle weakness (generalized)  Stiffness of left knee, not elsewhere classified  Rationale for Evaluation and Treatment: Rehabilitation  ONSET DATE: 12/15/23  SUBJECTIVE:  SUBJECTIVE STATEMENT:  Doing OK, hips are really hurting today- over did it a bit the other day when I was taking the battery out of my old corvette.   PERTINENT HISTORY: R hip OA  PAIN:  Are you having pain? Yes: NPRS scale: 2/10 Pain location: L knee is fine, B hips hurt more than anything   Pain description: general soreness  Aggravating factors: doing too much  Relieving factors: nothing   PRECAUTIONS: Fall  RED FLAGS: None   WEIGHT BEARING RESTRICTIONS: No  FALLS:  Has patient fallen in last 6 months? Yes. Number of falls 1 in September. Fell  in a ditch trying to chainsaw a tree that fell in the street  LIVING ENVIRONMENT: Lives with: lives with their spouse, Kathie Blair Lives in: House/apartment Stairs: Yes: External: 3 steps; on right going up Has following equipment at home: [name] - 2 wheeled  OCCUPATION: Retired - likes to fish  PLOF: Independent  PATIENT GOALS: Get back to fishing and normal activities  NEXT MD VISIT: 02/28/24  OBJECTIVE:  Note: Objective measures were completed at Evaluation unless otherwise noted.  DIAGNOSTIC FINDINGS: xray 12/15/23 IMPRESSION: Interval total left knee arthroplasty without evidence of hardware failure.  PATIENT SURVEYS:  Lower Extremity Functional Score: 26 / 80 = 32.5 %  COGNITION: Overall cognitive status: Within functional limits for tasks assessed     SENSATION: WFL  EDEMA:  L knee 50% bigger than R LE  MUSCLE LENGTH: Hamstrings: limited due to knee ROM on L Thomas test: Did not assess   LOWER EXTREMITY ROM:  Active ROM Right eval Left eval Left 01/04/24 Left 01/15/24 Left 02/01/24 Left 02/05/24 02/12/24 Left AROM 02/22/24  Hip flexion          Hip extension          Hip abduction          Hip adduction          Hip internal rotation          Hip external rotation          Knee flexion 125 68 90 87* AAROM, only able to get to 70* AROM due to pain  95 AAROM 106*, AROM 98* 102   Knee extension -5 -35 -12 -6* AROM supine  10 6* seated quad set AROM with leg propped on PT's knee  9 5* PROM   Ankle dorsiflexion          Ankle plantarflexion          Ankle inversion          Ankle eversion           (Blank rows = not tested)  LOWER EXTREMITY MMT:  MMT Right eval Left eval  Hip flexion 4+ 3+  Hip extension    Hip abduction 4 4-  Hip adduction 5 5  Hip internal rotation    Hip external rotation    Knee flexion 5 3+  Knee extension 5 3- (limited due to ROM)  Ankle dorsiflexion    Ankle plantarflexion    Ankle inversion    Ankle eversion     (Blank rows  = not tested)  FUNCTIONAL TESTS:  5 times sit to stand: L LE forward with bilat UE support; 23 sec Dynamic Gait Index: 14/24     GAIT: Distance walked: In clinic during DGI Assistive device utilized: Walker - 2 wheeled Level of assistance: Modified independence Comments: even step lengths bilat but diminished knee flexion during swing phase  and limited knee ext during stance phase                                                                                                                                TREATMENT DATE:  02/22/24  Bike x8 minutes seat 13-11 full rotations   R hip piriformis stretches 2x30 seconds L HS stretches 3x30 seconds STS with L LE staggered back/R forward x12  Quad stretch on 6 inch steps 3x30 seconds L  Lateral step ups 8 inch box x15 L LE  Forward step ups 8 inch box x15 L LE  L HS stretches on steps 3x30 seconds  Squats in // bars with BUEs on bars 10x3 second holds for L knee flexion stretch    Knee extension overpressure to 5* passive Knee flexion overpressure stretches      02/19/24 NuStep L5 x 6 min Bike L3 x 3 min GOALS  5x sit to stand 11.35 sec  LEFS Leg press 60lb 2x12 8 in forward and Lateral step ups x 10 each  R hip weakness with lateral step ups  LLE HS curls 20lb 2x10 L knee PROM   L tibiofemoral Jt mobs L knee flex & Ext stretch   02/15/24  Nustep L5x6 minutes BLEs only for ROM/warm up  Scifit bike seat 14 full rotations x6 minutes for ROM  HS stretches L LE 3x30 seconds  Knee flexion stretch on steps 12x10 seconds  Quad stretch on stairs 3x30 seconds L  Knee extension stretch with pad under ankles 3# x3 min  Bridges with progressive knee flexion 3x5   Knee extension OP with pad under ankle  Percussion gun to L quad + knee flexion OP seated           PATIENT EDUCATION:  Education details: Exam findings, POC, initial HEP Person educated: Patient Education method: Programmer, multimedia, Facilities manager, and  Handouts Education comprehension: verbalized understanding, returned demonstration, and needs further education  HOME EXERCISE PROGRAM: Access Code: [email or web address] URL: [email or web address] Date: 01/04/2024 Prepared by: Vernon Prey April Kirstie Peri  Exercises - Standing Knee Flexion Stretch on Step  - 1 x daily - 7 x weekly - 2 sets - 10 reps - Long Sitting Quad Set with Towel Roll Under Heel  - 1 x daily - 7 x weekly - 2 sets - 10 reps - Supine Hamstring Stretch with Strap  - 1 x daily - 7 x weekly - 2 sets - 30 sec hold - Squat with Chair Touch  - 1 x daily - 7 x weekly - 3 sets - 10 reps - Step Up  - 1 x daily - 7 x weekly - 2 sets - 10 reps - Lateral Step Up  - 1 x daily - 7 x weekly - 2 sets - 10 reps - Forward Step Down Touch with Heel  - 1 x daily - 7 x weekly - 2 sets - 10 reps  ASSESSMENT:  CLINICAL IMPRESSION:  Pt arrives today doing OK, feeling more sore than his usual due to having spent a lot of time working on the car yesterday. Continued working on ROM and strength as appropriate- planning on progress note next visit prior to MD appt.    OBJECTIVE IMPAIRMENTS: Abnormal gait, decreased activity tolerance, decreased balance, decreased endurance, decreased mobility, difficulty walking, decreased ROM, decreased strength, hypomobility, increased fascial restrictions, impaired flexibility, improper body mechanics, postural dysfunction, and pain.   ACTIVITY LIMITATIONS: carrying, lifting, bending, sitting, standing, squatting, sleeping, stairs, transfers, bed mobility, bathing, toileting, dressing, hygiene/grooming, and locomotion level  PARTICIPATION LIMITATIONS: meal prep, cleaning, driving, shopping, community activity, and yard work  PERSONAL FACTORS: Age, Fitness, Past/current experiences, and Time since onset of injury/illness/exacerbation are also affecting patient's functional outcome.   REHAB POTENTIAL: Good  CLINICAL DECISION MAKING: Evolving/moderate  complexity  EVALUATION COMPLEXITY: Moderate   GOALS: Goals reviewed with patient? Yes   SHORT TERM GOALS: Target date: 01/25/2024   Independent with initial HEP. Baseline: Newly provided Goal status: Met  02/01/24  2.  Pt will be able to perform 5x STS without keeping L foot in front of R Baseline: Increased weight on R with L foot in front Goal status: Met  02/01/24  3.  Pt will have improved knee ROM to 10-90 deg Baseline: 35-68 deg in sitting Goal status: Met 02/01/24   LONG TERM GOALS: Target date:  02/22/2024   Independent with advanced/ongoing HEP to improve outcomes and carryover.  Baseline: Initial HEP provided Goal status: INITIAL  2.  Alfonso Patten will demonstrate knee ROM from 5 to 120 deg to ascend/descend stairs. Baseline:  Goal status: Progressing 02/12/24  3.  BRYLEE MCGREAL will be able to ambulate 1000' safely with LRAD and normal gait pattern to access community.  Baseline:  Goal status: Met 02/01/24  4.  MICAIAH LITLE will have improved LEFS to >/=42.5%  Baseline: 32.5% Goal status: Ongoing 53.8%  5.  VASHAWN EKSTEIN will demonstrate > 19/24 on DGI to demonstrate decreased risk of falls.   Baseline: 14/24 Goal status: INITIAL  6.  Alfonso Patten will demonstrate improved functional LE strength by completing 5x STS in <15 seconds.  (MCID 5 seconds) Baseline: 23 sec Goal status: Met 02/19/24 11.35 sec   PLAN:  PT FREQUENCY: 2x/week  PT DURATION: 8 weeks  PLANNED INTERVENTIONS: 97164- PT Re-evaluation, 97110-Therapeutic exercises, 97530- Therapeutic activity, 97112- Neuromuscular re-education, 97535- Self Care, 63016- Manual therapy, [id number]- Gait training, [phone]- Aquatic Therapy, 97014- Electrical stimulation (unattended), 97016- Vasopneumatic device, 97035- Ultrasound, 23557- Ionotophoresis 4mg /ml Dexamethasone, Patient/Family education, Balance training, Stair training, Taping, Dry Needling, Joint mobilization, Spinal mobilization, Scar  mobilization, DME instructions, Cryotherapy, and Moist heat  PLAN FOR NEXT SESSION: progress note/insurance recert next visit   Nedra Hai, PT, DPT 02/22/24 1:40 PM

## 2024-02-26 ENCOUNTER — Encounter: Payer: Self-pay | Admitting: Physical Therapy

## 2024-02-26 ENCOUNTER — Ambulatory Visit: Payer: Medicare Other | Admitting: Physical Therapy

## 2024-02-26 DIAGNOSIS — M25562 Pain in left knee: Secondary | ICD-10-CM | POA: Diagnosis not present

## 2024-02-26 DIAGNOSIS — R262 Difficulty in walking, not elsewhere classified: Secondary | ICD-10-CM | POA: Diagnosis not present

## 2024-02-26 DIAGNOSIS — M25662 Stiffness of left knee, not elsewhere classified: Secondary | ICD-10-CM | POA: Diagnosis not present

## 2024-02-26 DIAGNOSIS — M6281 Muscle weakness (generalized): Secondary | ICD-10-CM

## 2024-02-26 DIAGNOSIS — R600 Localized edema: Secondary | ICD-10-CM | POA: Diagnosis not present

## 2024-02-26 NOTE — Progress Notes (Signed)
   02/26/24 0001  Dynamic Gait Index  Level Surface 3  Change in Gait Speed 2  Gait with Horizontal Head Turns 2  Gait with Vertical Head Turns 3  Gait and Pivot Turn 3  Step Over Obstacle 3  Step Around Obstacles 3  Steps 2  Total Score 21

## 2024-02-26 NOTE — Therapy (Signed)
 OUTPATIENT PHYSICAL THERAPY LOWER EXTREMITY TREATMENT/PROGRESS NOTE   Progress Note Reporting Period 02/01/24 to 02/26/24  See note below for Objective Data and Assessment of Progress/Goals.            Patient Name: Peter Blair MRN: [id number] DOB:12-03-1941, 83 y.o., male Today's Date: 02/26/2024  END OF SESSION:  PT End of Session - 02/26/24 1259     Visit Number 17    Authorization Type [employer]    Progress Note Due on Visit 27    PT Start Time 1300    PT Stop Time 1341    PT Time Calculation (min) 41 min    Activity Tolerance Patient tolerated treatment well    Behavior During Therapy WFL for tasks assessed/performed                   Past Medical History:  Diagnosis Date   Coronary artery disease    exertional chest pain, cath 12/10 showed EF 50-55% with mild inferior hypokensis, 99% proximal and 60% distal RCA stenosis, 80% prox stenosis of a moderate sized D1, 40% prox LAD. Patient had BMS x2 placed in RCA. Unstable Angina. PROMUS DES to the prox RCA   Diabetes mellitus without complication (HCC)    Erectile dysfunction    GERD (gastroesophageal reflux disease)    Hyperlipidemia    Hypertension    Nephrolithiasis    Dr. Achilles Dunk   Renal cyst    Right shoulder pain    rotator cuff   Skin cancer    Skin cancer, PMH of, cell type unknown, Dr. Margo Aye   Past Surgical History:  Procedure Laterality Date   CATARACT EXTRACTION W/ INTRAOCULAR LENS IMPLANT Bilateral    colonoscopy with ploypectomy  03/05/2012    Dr Jarold Motto, GI   CORONARY ANGIOPLASTY WITH STENT PLACEMENT     X 3 total stents   cystoscopic removal  04/04/2010   CYSTOSCOPY N/A 12/15/2023   Procedure: CYSTOSCOPY FLEXIBLE; URETHERAL DILATION; INSERTION OF FOLEY CATHETER;  Surgeon: [name], MD;  Location: [facility];  Service: Urology;  Laterality: N/A;   Flexibe sigmoidoscopy   12/06/1999   LITHOTRIPSY     x 1   RECONSTRUCTION OF EYELID Left    TOTAL KNEE ARTHROPLASTY Left  12/15/2023   Procedure: LEFT TOTAL KNEE ARTHROPLASTY;  Surgeon: [name], MD;  Location: [facility];  Service: Orthopedics;  Laterality: Left;   Patient Active Problem List   Diagnosis Date Noted   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/15/2023   Unilateral primary osteoarthritis, right hip 09/25/2023   Actinic keratosis 10/17/2018   Type 2 diabetes mellitus with other circulatory complications (HCC) 12/11/2017   Osteoarthritis, multiple sites 10/11/2017   Preventative health care 08/05/2015   Advance directive discussed with patient 08/05/2015   GERD (gastroesophageal reflux disease)    Hyperplastic colonic polyp 12/17/2013   Benign prostatic hyperplasia with urinary obstruction 01/16/2013   ED (erectile dysfunction) of organic origin 01/16/2013   Coronary atherosclerosis of native coronary artery 11/20/2009   NEPHROLITHIASIS 09/29/2009   RENAL CYST, RIGHT 09/29/2009   Hyperlipemia 09/16/2008   SKIN CANCER, HX OF 09/16/2008   Essential hypertension, benign 07/09/2007    PCP: [name], MD  REFERRING PROVIDER: [name], MD  REFERRING DIAG:  [phone] (ICD-10-CM) - Unilateral primary osteoarthritis, left knee   THERAPY DIAG:  Localized edema  Difficulty in walking, not elsewhere classified  Muscle weakness (generalized)  Stiffness of left knee, not elsewhere classified  Acute pain  of left knee  Rationale for Evaluation and Treatment: Rehabilitation  ONSET DATE: 12/15/23  SUBJECTIVE:   SUBJECTIVE STATEMENT:  Corvette kicked my butt again, I had to do a lot of work on it the other day and I'm still very sore.   PERTINENT HISTORY: R hip OA  PAIN:  Are you having pain? Yes: NPRS scale: 1-2/10 Pain location: L knee is fine, B hips hurt more than anything   Pain description: general soreness  Aggravating factors: doing too much  Relieving factors: nothing   PRECAUTIONS: Fall  RED FLAGS: None   WEIGHT BEARING RESTRICTIONS:  No  FALLS:  Has patient fallen in last 6 months? Yes. Number of falls 1 in September. Fell in a ditch trying to chainsaw a tree that fell in the street  LIVING ENVIRONMENT: Lives with: lives with their spouse, Peter Blair Lives in: House/apartment Stairs: Yes: External: 3 steps; on right going up Has following equipment at home: [name] - 2 wheeled  OCCUPATION: Retired - likes to fish  PLOF: Independent  PATIENT GOALS: Get back to fishing and normal activities  NEXT MD VISIT: 02/28/24  OBJECTIVE:  Note: Objective measures were completed at Evaluation unless otherwise noted.  DIAGNOSTIC FINDINGS: xray 12/15/23 IMPRESSION: Interval total left knee arthroplasty without evidence of hardware failure.  PATIENT SURVEYS:  Lower Extremity Functional Score: 26 / 80 = 32.5 %; 02/26/24  50/80, 62.5%  COGNITION: Overall cognitive status: Within functional limits for tasks assessed     SENSATION: WFL  EDEMA:  L knee 50% bigger than R LE  MUSCLE LENGTH: Hamstrings: limited due to knee ROM on L Thomas test: Did not assess   LOWER EXTREMITY ROM:  Active ROM Right eval Left eval Left 01/04/24 Left 01/15/24 Left 02/01/24 Left 02/05/24 02/12/24 Left AROM 02/22/24 02/26/24 AROM   Hip flexion           Hip extension           Hip abduction           Hip adduction           Hip internal rotation           Hip external rotation           Knee flexion 125 68 90 87* AAROM, only able to get to 70* AROM due to pain  95 AAROM 106*, AROM 98* 102  108*  Knee extension -5 -35 -12 -6* AROM supine  10 6* seated quad set AROM with leg propped on PT's knee  9 5* PROM  11*  Ankle dorsiflexion           Ankle plantarflexion           Ankle inversion           Ankle eversion            (Blank rows = not tested)  LOWER EXTREMITY MMT:  MMT Right eval Left eval Left 02/26/24  Hip flexion 4+ 3+ 4+  Hip extension     Hip abduction 4 4-   Hip adduction 5 5   Hip internal rotation     Hip external  rotation     Knee flexion 5 3+ 4+  Knee extension 5 3- (limited due to ROM) 4+  Ankle dorsiflexion     Ankle plantarflexion     Ankle inversion     Ankle eversion      (Blank rows = not tested)  FUNCTIONAL TESTS:  5 times sit to stand:  L LE forward with bilat UE support; 23 sec; 02/26/24 13 seconds no UEs  Dynamic Gait Index: 14/24; 02/26/24 21/24   [facility] PT Assessment - 02/26/24 0001       Dynamic Gait Index   Level Surface Normal    Change in Gait Speed Mild Impairment    Gait with Horizontal Head Turns Mild Impairment    Gait with Vertical Head Turns Normal    Gait and Pivot Turn Normal    Step Over Obstacle Normal    Step Around Obstacles Normal    Steps Mild Impairment    Total Score 21              02/26/24 0001  Dynamic Gait Index  Level Surface 3  Change in Gait Speed 2  Gait with Horizontal Head Turns 2  Gait with Vertical Head Turns 3  Gait and Pivot Turn 3  Step Over Obstacle 3  Step Around Obstacles 3  Steps 2  Total Score 21     GAIT: Distance walked: In clinic during DGI Assistive device utilized: Walker - 2 wheeled Level of assistance: Modified independence Comments: even step lengths bilat but diminished knee flexion during swing phase and limited knee ext during stance phase                                                                                                                                TREATMENT DATE:   02/26/24  Nustep x10 minutes for w/u and ROM prior to re-assessment   LEFS, objective measures as above and education on progress towards goals  HS stretch on steps 4x45 seconds  Knee extension stretch with 2# on knee x3 minutes        02/22/24  Bike x8 minutes seat 13-11 full rotations   R hip piriformis stretches 2x30 seconds L HS stretches 3x30 seconds STS with L LE staggered back/R forward x12  Quad stretch on 6 inch steps 3x30 seconds L  Lateral step ups 8 inch box x15 L LE  Forward step ups 8 inch box  x15 L LE  L HS stretches on steps 3x30 seconds  Squats in // bars with BUEs on bars 10x3 second holds for L knee flexion stretch    Knee extension overpressure to 5* passive Knee flexion overpressure stretches                 PATIENT EDUCATION:  Education details: Exam findings, POC, initial HEP Person educated: Patient Education method: Programmer, multimedia, Demonstration, and Handouts Education comprehension: verbalized understanding, returned demonstration, and needs further education  HOME EXERCISE PROGRAM:  Access Code: [email or web address] URL: [email or web address] Date: 02/26/2024 Prepared by: Nedra Hai  Exercises - Standing Knee Flexion Stretch on Step  - 1 x daily - 7 x weekly - 2 sets - 10 reps - Long Sitting Quad Set with Towel Roll Under Heel  - 1 x daily - 7 x weekly - 2 sets -  10 reps - Supine Hamstring Stretch with Strap  - 1 x daily - 7 x weekly - 2 sets - 30 sec hold - Squat with Chair Touch  - 1 x daily - 7 x weekly - 3 sets - 10 reps - Step Up  - 1 x daily - 7 x weekly - 2 sets - 10 reps - Lateral Step Up  - 1 x daily - 7 x weekly - 2 sets - 10 reps - Forward Step Down Touch with Heel  - 1 x daily - 7 x weekly - 2 sets - 10 reps - Seated Knee Extension Stretch with Chair  - 2-3 x daily - 7 x weekly - 1 sets - 1 reps - 3-5 minutes  hold - Standing Hamstring Stretch with Step  - 2-3 x daily - 7 x weekly - 1 sets - 3 reps - 30 seconds  hold  ASSESSMENT:  CLINICAL IMPRESSION:  Pt arrives today doing well, we focused on formal re-assess for insurance resubmission. He is really doing well with all goals met except for ROM goal. I do think that given significant extension ROM limitation and reported difficulty with stair descent, he would benefit from continued skilled PT services moving forward. He is seeing his surgeon this week, anticipate he will be happy with overall progress.   OBJECTIVE IMPAIRMENTS: Abnormal gait, decreased activity tolerance,  decreased balance, decreased endurance, decreased mobility, difficulty walking, decreased ROM, decreased strength, hypomobility, increased fascial restrictions, impaired flexibility, improper body mechanics, postural dysfunction, and pain.   ACTIVITY LIMITATIONS: carrying, lifting, bending, sitting, standing, squatting, sleeping, stairs, transfers, bed mobility, bathing, toileting, dressing, hygiene/grooming, and locomotion level  PARTICIPATION LIMITATIONS: meal prep, cleaning, driving, shopping, community activity, and yard work  PERSONAL FACTORS: Age, Fitness, Past/current experiences, and Time since onset of injury/illness/exacerbation are also affecting patient's functional outcome.   REHAB POTENTIAL: Good  CLINICAL DECISION MAKING: Evolving/moderate complexity  EVALUATION COMPLEXITY: Moderate   GOALS: Goals reviewed with patient? Yes   SHORT TERM GOALS: Target date: 03/11/2024     Independent with initial HEP. Baseline: Newly provided Goal status: Met  02/01/24  2.  Pt will be able to perform 5x STS without keeping L foot in front of R Baseline: Increased weight on R with L foot in front Goal status: Met  02/01/24  3.  Pt will have improved knee ROM to 10-90 deg Baseline: 35-68 deg in sitting Goal status: Met 02/01/24   LONG TERM GOALS: Target date:  03/25/2024     Independent with advanced/ongoing HEP to improve outcomes and carryover.  Baseline: Initial HEP provided Goal status: IN PROGRESS 02/26/24  2.  Alfonso Patten will demonstrate knee ROM from 5 to 120 deg to ascend/descend stairs. Baseline:  Goal status: Progressing 02/26/24  3.  EZEKIEL MENZER will be able to ambulate 1000' safely with LRAD and normal gait pattern to access community.  Baseline:  Goal status: Met 02/01/24  4.  BLESS BELSHE will have improved LEFS to >/=42.5%  Baseline: 32.5% Goal status: MET 02/26/24  5.  BLUFORD SEDLER will demonstrate > 19/24 on DGI to demonstrate decreased risk of  falls.   Baseline: 14/24 Goal status: MET 02/26/24  6.  Alfonso Patten will demonstrate improved functional LE strength by completing 5x STS in <15 seconds.  (MCID 5 seconds) Baseline: 23 sec Goal status: Met 02/19/24 11.35 sec   PLAN:  PT FREQUENCY: 2x/week  PT DURATION: 4 weeks  PLANNED INTERVENTIONS: 97110-Therapeutic exercises, 97530-  Therapeutic activity, [id number]- Neuromuscular re-education, [id number]- Self Care, [id number]- Manual therapy, [id number]- Gait training, and [id number]- Vasopneumatic device  PLAN FOR NEXT SESSION: ROM focus   Nedra Hai, PT, DPT 02/26/24 1:47 PM

## 2024-02-28 ENCOUNTER — Ambulatory Visit (INDEPENDENT_AMBULATORY_CARE_PROVIDER_SITE_OTHER): Payer: Medicare Other | Admitting: Orthopaedic Surgery

## 2024-02-28 ENCOUNTER — Encounter: Payer: Self-pay | Admitting: Orthopaedic Surgery

## 2024-02-28 DIAGNOSIS — Z96652 Presence of left artificial knee joint: Secondary | ICD-10-CM

## 2024-02-28 NOTE — Progress Notes (Signed)
 The patient is an 83 year old gentleman who is now almost 11 weeks status post a left total knee arthroplasty.  He says he is getting good range of motion and strength of that knee and therapy has suggested extending his PT for about 8 more visits.  I actually agree with this given the fact that he is still having right hip pain.  Most of his pain in the right hip is on the lateral aspect of his right hip.  He has had an intra-articular injection of the right hip joint and I did not really help him as much.  On my exam today his right hip is moving smoothly with just a little bit of stiffness but most of his pain is over the trochanteric area.  His left operative knee has still some swelling and warmth to be expected but his extension is almost full and his flexion is much improved as well with past to 100 degrees of flexion.  I agree with him continuing outpatient physical therapy for his left knee but I would also like the therapist to work on his right hip with any modalities that would help decrease his right hip pain.  I did fill this out on her prescription as well for him to give the therapist.  We will then see him back in 6 weeks.  At that visit I would like a standing AP and lateral of his left operative knee.

## 2024-02-29 ENCOUNTER — Ambulatory Visit: Payer: Medicare Other

## 2024-02-29 DIAGNOSIS — N35013 Post-traumatic anterior urethral stricture: Secondary | ICD-10-CM | POA: Diagnosis not present

## 2024-02-29 DIAGNOSIS — N281 Cyst of kidney, acquired: Secondary | ICD-10-CM | POA: Diagnosis not present

## 2024-02-29 DIAGNOSIS — Z87442 Personal history of urinary calculi: Secondary | ICD-10-CM | POA: Diagnosis not present

## 2024-03-05 DIAGNOSIS — H26493 Other secondary cataract, bilateral: Secondary | ICD-10-CM | POA: Diagnosis not present

## 2024-03-05 DIAGNOSIS — Z961 Presence of intraocular lens: Secondary | ICD-10-CM | POA: Diagnosis not present

## 2024-03-05 DIAGNOSIS — E119 Type 2 diabetes mellitus without complications: Secondary | ICD-10-CM | POA: Diagnosis not present

## 2024-03-05 DIAGNOSIS — H43813 Vitreous degeneration, bilateral: Secondary | ICD-10-CM | POA: Diagnosis not present

## 2024-03-05 LAB — HM DIABETES EYE EXAM

## 2024-03-06 ENCOUNTER — Encounter: Payer: Self-pay | Admitting: Physical Therapy

## 2024-03-06 ENCOUNTER — Ambulatory Visit: Attending: Orthopaedic Surgery | Admitting: Physical Therapy

## 2024-03-06 DIAGNOSIS — M25662 Stiffness of left knee, not elsewhere classified: Secondary | ICD-10-CM | POA: Insufficient documentation

## 2024-03-06 DIAGNOSIS — M6281 Muscle weakness (generalized): Secondary | ICD-10-CM | POA: Insufficient documentation

## 2024-03-06 DIAGNOSIS — R262 Difficulty in walking, not elsewhere classified: Secondary | ICD-10-CM | POA: Insufficient documentation

## 2024-03-06 DIAGNOSIS — M25562 Pain in left knee: Secondary | ICD-10-CM | POA: Diagnosis not present

## 2024-03-06 DIAGNOSIS — R600 Localized edema: Secondary | ICD-10-CM | POA: Insufficient documentation

## 2024-03-06 NOTE — Therapy (Signed)
 OUTPATIENT PHYSICAL THERAPY LOWER EXTREMITY TREATMENT/PROGRESS NOTE           Patient Name: Peter Blair MRN: [id number] DOB:Jan 23, 1941, 83 y.o., male Today's Date: 03/06/2024  END OF SESSION:  PT End of Session - 03/06/24 1056     Visit Number 18    Date for PT Re-Evaluation 04/08/24    PT Start Time 1100    PT Stop Time 1145    PT Time Calculation (min) 45 min    Activity Tolerance Patient tolerated treatment well    Behavior During Therapy [facility] for tasks assessed/performed                   Past Medical History:  Diagnosis Date   Coronary artery disease    exertional chest pain, cath 12/10 showed EF 50-55% with mild inferior hypokensis, 99% proximal and 60% distal RCA stenosis, 80% prox stenosis of a moderate sized D1, 40% prox LAD. Patient had BMS x2 placed in RCA. Unstable Angina. PROMUS DES to the prox RCA   Diabetes mellitus without complication (HCC)    Erectile dysfunction    GERD (gastroesophageal reflux disease)    Hyperlipidemia    Hypertension    Nephrolithiasis    Dr. Achilles Dunk   Renal cyst    Right shoulder pain    rotator cuff   Skin cancer    Skin cancer, PMH of, cell type unknown, Dr. Margo Aye   Past Surgical History:  Procedure Laterality Date   CATARACT EXTRACTION W/ INTRAOCULAR LENS IMPLANT Bilateral    colonoscopy with ploypectomy  03/05/2012    Dr Jarold Motto, GI   CORONARY ANGIOPLASTY WITH STENT PLACEMENT     X 3 total stents   cystoscopic removal  04/04/2010   CYSTOSCOPY N/A 12/15/2023   Procedure: CYSTOSCOPY FLEXIBLE; URETHERAL DILATION; INSERTION OF FOLEY CATHETER;  Surgeon: [name], MD;  Location: [facility];  Service: Urology;  Laterality: N/A;   Flexibe sigmoidoscopy   12/06/1999   LITHOTRIPSY     x 1   RECONSTRUCTION OF EYELID Left    TOTAL KNEE ARTHROPLASTY Left 12/15/2023   Procedure: LEFT TOTAL KNEE ARTHROPLASTY;  Surgeon: [name], MD;  Location: [facility];  Service: Orthopedics;  Laterality: Left;    Patient Active Problem List   Diagnosis Date Noted   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/15/2023   Unilateral primary osteoarthritis, right hip 09/25/2023   Actinic keratosis 10/17/2018   Type 2 diabetes mellitus with other circulatory complications (HCC) 12/11/2017   Osteoarthritis, multiple sites 10/11/2017   Preventative health care 08/05/2015   Advance directive discussed with patient 08/05/2015   GERD (gastroesophageal reflux disease)    Hyperplastic colonic polyp 12/17/2013   Benign prostatic hyperplasia with urinary obstruction 01/16/2013   ED (erectile dysfunction) of organic origin 01/16/2013   Coronary atherosclerosis of native coronary artery 11/20/2009   NEPHROLITHIASIS 09/29/2009   RENAL CYST, RIGHT 09/29/2009   Hyperlipemia 09/16/2008   SKIN CANCER, HX OF 09/16/2008   Essential hypertension, benign 07/09/2007    PCP: [name], MD  REFERRING PROVIDER: [name], MD  REFERRING DIAG:  [phone] (ICD-10-CM) - Unilateral primary osteoarthritis, left knee   THERAPY DIAG:  Localized edema  Difficulty in walking, not elsewhere classified  Muscle weakness (generalized)  Stiffness of left knee, not elsewhere classified  Acute pain of left knee  Rationale for Evaluation and Treatment: Rehabilitation  ONSET DATE: 12/15/23  SUBJECTIVE:   SUBJECTIVE STATEMENT: Last few days his R hip has been bad,  Today he feels pretty good  PERTINENT HISTORY: R hip OA  PAIN:  Are you having pain? Yes: NPRS scale: 1-2/10 Pain location: L knee is fine, B hips hurt more than anything   Pain description: general soreness  Aggravating factors: doing too much  Relieving factors: nothing   PRECAUTIONS: Fall  RED FLAGS: None   WEIGHT BEARING RESTRICTIONS: No  FALLS:  Has patient fallen in last 6 months? Yes. Number of falls 1 in September. Fell in a ditch trying to chainsaw a tree that fell in the street  LIVING ENVIRONMENT: Lives with: lives  with their spouse, [name] Lives in: House/apartment Stairs: Yes: External: 3 steps; on right going up Has following equipment at home: [name] - 2 wheeled  OCCUPATION: Retired - likes to fish  PLOF: Independent  PATIENT GOALS: Get back to fishing and normal activities  NEXT MD VISIT: 02/28/24  OBJECTIVE:  Note: Objective measures were completed at Evaluation unless otherwise noted.  DIAGNOSTIC FINDINGS: xray 12/15/23 IMPRESSION: Interval total left knee arthroplasty without evidence of hardware failure.  PATIENT SURVEYS:  Lower Extremity Functional Score: 26 / 80 = 32.5 %; 02/26/24  50/80, 62.5%  COGNITION: Overall cognitive status: Within functional limits for tasks assessed     SENSATION: WFL  EDEMA:  L knee 50% bigger than R LE  MUSCLE LENGTH: Hamstrings: limited due to knee ROM on L Thomas test: Did not assess   LOWER EXTREMITY ROM:  Active ROM Right eval Left eval Left 01/04/24 Left 01/15/24 Left 02/01/24 Left 02/05/24 02/12/24 Left AROM 02/22/24 02/26/24 AROM   Hip flexion           Hip extension           Hip abduction           Hip adduction           Hip internal rotation           Hip external rotation           Knee flexion 125 68 90 87* AAROM, only able to get to 70* AROM due to pain  95 AAROM 106*, AROM 98* 102  108*  Knee extension -5 -35 -12 -6* AROM supine  10 6* seated quad set AROM with leg propped on PT's knee  9 5* PROM  11*  Ankle dorsiflexion           Ankle plantarflexion           Ankle inversion           Ankle eversion            (Blank rows = not tested)  LOWER EXTREMITY MMT:  MMT Right eval Left eval Left 02/26/24  Hip flexion 4+ 3+ 4+  Hip extension     Hip abduction 4 4-   Hip adduction 5 5   Hip internal rotation     Hip external rotation     Knee flexion 5 3+ 4+  Knee extension 5 3- (limited due to ROM) 4+  Ankle dorsiflexion     Ankle plantarflexion     Ankle inversion     Ankle eversion      (Blank rows = not  tested)  FUNCTIONAL TESTS:  5 times sit to stand: L LE forward with bilat UE support; 23 sec; 02/26/24 13 seconds no UEs  Dynamic Gait Index: 14/24; 02/26/24 21/24      02/26/24 0001  Dynamic Gait Index  Level Surface 3  Change in Gait  Speed 2  Gait with Horizontal Head Turns 2  Gait with Vertical Head Turns 3  Gait and Pivot Turn 3  Step Over Obstacle 3  Step Around Obstacles 3  Steps 2  Total Score 21     GAIT: Distance walked: In clinic during DGI Assistive device utilized: Walker - 2 wheeled Level of assistance: Modified independence Comments: even step lengths bilat but diminished knee flexion during swing phase and limited knee ext during stance phase                                                                                                                                TREATMENT DATE: 03/06/24 NuStep L5 x5 min Bike L3 x 3 min LLE eccentric step downs 4in x 10 Leg press 60lb 2x10, LLE 30lb 2x10 8in step ups x10 each HS curls 35lb 2x10, LLE 20lb x10 Leg Ext 15lb 2x10, LLE 5lb 2x10 L knee PROM w/ end range holds  L knee patellar mobs   02/26/24  Nustep x10 minutes for w/u and ROM prior to re-assessment   LEFS, objective measures as above and education on progress towards goals  HS stretch on steps 4x45 seconds  Knee extension stretch with 2# on knee x3 minutes     02/22/24  Bike x8 minutes seat 13-11 full rotations   R hip piriformis stretches 2x30 seconds L HS stretches 3x30 seconds STS with L LE staggered back/R forward x12  Quad stretch on 6 inch steps 3x30 seconds L  Lateral step ups 8 inch box x15 L LE  Forward step ups 8 inch box x15 L LE  L HS stretches on steps 3x30 seconds  Squats in // bars with BUEs on bars 10x3 second holds for L knee flexion stretch    Knee extension overpressure to 5* passive Knee flexion overpressure stretches                 PATIENT EDUCATION:  Education details: Exam findings, POC, initial  HEP Person educated: Patient Education method: Explanation, Demonstration, and Handouts Education comprehension: verbalized understanding, returned demonstration, and needs further education  HOME EXERCISE PROGRAM:  Access Code: [email or web address] URL: [email or web address] Date: 02/26/2024 Prepared by: Nedra Hai  Exercises - Standing Knee Flexion Stretch on Step  - 1 x daily - 7 x weekly - 2 sets - 10 reps - Long Sitting Quad Set with Towel Roll Under Heel  - 1 x daily - 7 x weekly - 2 sets - 10 reps - Supine Hamstring Stretch with Strap  - 1 x daily - 7 x weekly - 2 sets - 30 sec hold - Squat with Chair Touch  - 1 x daily - 7 x weekly - 3 sets - 10 reps - Step Up  - 1 x daily - 7 x weekly - 2 sets - 10 reps - Lateral Step Up  - 1 x daily - 7 x weekly - 2 sets - 10  reps - Forward Step Down Touch with Heel  - 1 x daily - 7 x weekly - 2 sets - 10 reps - Seated Knee Extension Stretch with Chair  - 2-3 x daily - 7 x weekly - 1 sets - 1 reps - 3-5 minutes  hold - Standing Hamstring Stretch with Step  - 2-3 x daily - 7 x weekly - 1 sets - 3 reps - 30 seconds  hold  ASSESSMENT:  CLINICAL IMPRESSION:  Pt arrives today doing well, we focused L knee strength and extension ROM. He is really doing well with all goals met except for ROM goal. Pain at end range with passive knee extensions. Cue to fully extend L knee when stepping up. Hhe would benefit from continued skilled PT services moving forward.    OBJECTIVE IMPAIRMENTS: Abnormal gait, decreased activity tolerance, decreased balance, decreased endurance, decreased mobility, difficulty walking, decreased ROM, decreased strength, hypomobility, increased fascial restrictions, impaired flexibility, improper body mechanics, postural dysfunction, and pain.   ACTIVITY LIMITATIONS: carrying, lifting, bending, sitting, standing, squatting, sleeping, stairs, transfers, bed mobility, bathing, toileting, dressing, hygiene/grooming, and  locomotion level  PARTICIPATION LIMITATIONS: meal prep, cleaning, driving, shopping, community activity, and yard work  PERSONAL FACTORS: Age, Fitness, Past/current experiences, and Time since onset of injury/illness/exacerbation are also affecting patient's functional outcome.   REHAB POTENTIAL: Good  CLINICAL DECISION MAKING: Evolving/moderate complexity  EVALUATION COMPLEXITY: Moderate   GOALS: Goals reviewed with patient? Yes   SHORT TERM GOALS: Target date: 03/11/2024     Independent with initial HEP. Baseline: Newly provided Goal status: Met  02/01/24  2.  Pt will be able to perform 5x STS without keeping L foot in front of R Baseline: Increased weight on R with L foot in front Goal status: Met  02/01/24  3.  Pt will have improved knee ROM to 10-90 deg Baseline: 35-68 deg in sitting Goal status: Met 02/01/24   LONG TERM GOALS: Target date:  03/25/2024     Independent with advanced/ongoing HEP to improve outcomes and carryover.  Baseline: Initial HEP provided Goal status: IN PROGRESS 02/26/24  2.  Alfonso Patten will demonstrate knee ROM from 5 to 120 deg to ascend/descend stairs. Baseline:  Goal status: Progressing 02/26/24  3.  JAKEEL STARLIPER will be able to ambulate 1000' safely with LRAD and normal gait pattern to access community.  Baseline:  Goal status: Met 02/01/24  4.  CORBYN WILDEY will have improved LEFS to >/=42.5%  Baseline: 32.5% Goal status: MET 02/26/24  5.  NERI SAMEK will demonstrate > 19/24 on DGI to demonstrate decreased risk of falls.   Baseline: 14/24 Goal status: MET 02/26/24  6.  Alfonso Patten will demonstrate improved functional LE strength by completing 5x STS in <15 seconds.  (MCID 5 seconds) Baseline: 23 sec Goal status: Met 02/19/24 11.35 sec   PLAN:  PT FREQUENCY: 2x/week  PT DURATION: 4 weeks  PLANNED INTERVENTIONS: 97110-Therapeutic exercises, 97530- Therapeutic activity, [id number]- Neuromuscular re-education, 97535-  Self Care, 14782- Manual therapy, [phone]- Gait training, and 97016- Vasopneumatic device  PLAN FOR NEXT SESSION: ROM focus   Debroah Baller, PTA 03/06/24 10:58 AM

## 2024-03-07 ENCOUNTER — Ambulatory Visit: Admitting: Physical Therapy

## 2024-03-07 ENCOUNTER — Encounter: Payer: Self-pay | Admitting: Physical Therapy

## 2024-03-07 DIAGNOSIS — M25662 Stiffness of left knee, not elsewhere classified: Secondary | ICD-10-CM

## 2024-03-07 DIAGNOSIS — R262 Difficulty in walking, not elsewhere classified: Secondary | ICD-10-CM

## 2024-03-07 DIAGNOSIS — M6281 Muscle weakness (generalized): Secondary | ICD-10-CM | POA: Diagnosis not present

## 2024-03-07 DIAGNOSIS — R600 Localized edema: Secondary | ICD-10-CM

## 2024-03-07 DIAGNOSIS — M25562 Pain in left knee: Secondary | ICD-10-CM | POA: Diagnosis not present

## 2024-03-07 NOTE — Therapy (Signed)
 OUTPATIENT PHYSICAL THERAPY LOWER EXTREMITY TREATMENT/PROGRESS NOTE           Patient Name: Peter Blair MRN: [id number] DOB:Nov 04, 1941, 83 y.o., male Today's Date: 03/07/2024  END OF SESSION:  PT End of Session - 03/07/24 1554     Visit Number 19    Date for PT Re-Evaluation 04/08/24    PT Start Time 1600    PT Stop Time 1645    PT Time Calculation (min) 45 min    Activity Tolerance Patient tolerated treatment well    Behavior During Therapy [facility] for tasks assessed/performed                   Past Medical History:  Diagnosis Date   Coronary artery disease    exertional chest pain, cath 12/10 showed EF 50-55% with mild inferior hypokensis, 99% proximal and 60% distal RCA stenosis, 80% prox stenosis of a moderate sized D1, 40% prox LAD. Patient had BMS x2 placed in RCA. Unstable Angina. PROMUS DES to the prox RCA   Diabetes mellitus without complication (HCC)    Erectile dysfunction    GERD (gastroesophageal reflux disease)    Hyperlipidemia    Hypertension    Nephrolithiasis    Dr. Achilles Dunk   Renal cyst    Right shoulder pain    rotator cuff   Skin cancer    Skin cancer, PMH of, cell type unknown, Dr. Margo Aye   Past Surgical History:  Procedure Laterality Date   CATARACT EXTRACTION W/ INTRAOCULAR LENS IMPLANT Bilateral    colonoscopy with ploypectomy  03/05/2012    Dr Jarold Motto, GI   CORONARY ANGIOPLASTY WITH STENT PLACEMENT     X 3 total stents   cystoscopic removal  04/04/2010   CYSTOSCOPY N/A 12/15/2023   Procedure: CYSTOSCOPY FLEXIBLE; URETHERAL DILATION; INSERTION OF FOLEY CATHETER;  Surgeon: [name], MD;  Location: [facility];  Service: Urology;  Laterality: N/A;   Flexibe sigmoidoscopy   12/06/1999   LITHOTRIPSY     x 1   RECONSTRUCTION OF EYELID Left    TOTAL KNEE ARTHROPLASTY Left 12/15/2023   Procedure: LEFT TOTAL KNEE ARTHROPLASTY;  Surgeon: [name], MD;  Location: [facility];  Service: Orthopedics;  Laterality: Left;    Patient Active Problem List   Diagnosis Date Noted   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/15/2023   Unilateral primary osteoarthritis, right hip 09/25/2023   Actinic keratosis 10/17/2018   Type 2 diabetes mellitus with other circulatory complications (HCC) 12/11/2017   Osteoarthritis, multiple sites 10/11/2017   Preventative health care 08/05/2015   Advance directive discussed with patient 08/05/2015   GERD (gastroesophageal reflux disease)    Hyperplastic colonic polyp 12/17/2013   Benign prostatic hyperplasia with urinary obstruction 01/16/2013   ED (erectile dysfunction) of organic origin 01/16/2013   Coronary atherosclerosis of native coronary artery 11/20/2009   NEPHROLITHIASIS 09/29/2009   RENAL CYST, RIGHT 09/29/2009   Hyperlipemia 09/16/2008   SKIN CANCER, HX OF 09/16/2008   Essential hypertension, benign 07/09/2007    PCP: [name], MD  REFERRING PROVIDER: [name], MD  REFERRING DIAG:  [phone] (ICD-10-CM) - Unilateral primary osteoarthritis, left knee   THERAPY DIAG:  Localized edema  Difficulty in walking, not elsewhere classified  Muscle weakness (generalized)  Stiffness of left knee, not elsewhere classified  Acute pain of left knee  Rationale for Evaluation and Treatment: Rehabilitation  ONSET DATE: 12/15/23  SUBJECTIVE:   SUBJECTIVE STATEMENT: Still sore form yesterday  PERTINENT HISTORY: R hip  OA  PAIN:  Are you having pain? Yes: NPRS scale: 1/10 Pain location: L knee is fine, B hips hurt more than anything   Pain description: general soreness  Aggravating factors: doing too much  Relieving factors: nothing   PRECAUTIONS: Fall  RED FLAGS: None   WEIGHT BEARING RESTRICTIONS: No  FALLS:  Has patient fallen in last 6 months? Yes. Number of falls 1 in September. Fell in a ditch trying to chainsaw a tree that fell in the street  LIVING ENVIRONMENT: Lives with: lives with their spouse, [name] Lives in:  House/apartment Stairs: Yes: External: 3 steps; on right going up Has following equipment at home: [name] - 2 wheeled  OCCUPATION: Retired - likes to fish  PLOF: Independent  PATIENT GOALS: Get back to fishing and normal activities  NEXT MD VISIT: 02/28/24  OBJECTIVE:  Note: Objective measures were completed at Evaluation unless otherwise noted.  DIAGNOSTIC FINDINGS: xray 12/15/23 IMPRESSION: Interval total left knee arthroplasty without evidence of hardware failure.  PATIENT SURVEYS:  Lower Extremity Functional Score: 26 / 80 = 32.5 %; 02/26/24  50/80, 62.5%  COGNITION: Overall cognitive status: Within functional limits for tasks assessed     SENSATION: WFL  EDEMA:  L knee 50% bigger than R LE  MUSCLE LENGTH: Hamstrings: limited due to knee ROM on L Thomas test: Did not assess   LOWER EXTREMITY ROM:  Active ROM Right eval Left eval Left 01/04/24 Left 01/15/24 Left 02/01/24 Left 02/05/24 02/12/24 Left AROM 02/22/24 02/26/24 AROM  03/07/24 L AROM  Hip flexion            Hip extension            Hip abduction            Hip adduction            Hip internal rotation            Hip external rotation            Knee flexion 125 68 90 87* AAROM, only able to get to 70* AROM due to pain  95 AAROM 106*, AROM 98* 102  108* 106  Knee extension -5 -35 -12 -6* AROM supine  10 6* seated quad set AROM with leg propped on PT's knee  9 5* PROM  11* 8  Ankle dorsiflexion            Ankle plantarflexion            Ankle inversion            Ankle eversion             (Blank rows = not tested)  LOWER EXTREMITY MMT:  MMT Right eval Left eval Left 02/26/24  Hip flexion 4+ 3+ 4+  Hip extension     Hip abduction 4 4-   Hip adduction 5 5   Hip internal rotation     Hip external rotation     Knee flexion 5 3+ 4+  Knee extension 5 3- (limited due to ROM) 4+  Ankle dorsiflexion     Ankle plantarflexion     Ankle inversion     Ankle eversion      (Blank rows = not  tested)  FUNCTIONAL TESTS:  5 times sit to stand: L LE forward with bilat UE support; 23 sec; 02/26/24 13 seconds no UEs  Dynamic Gait Index: 14/24; 02/26/24 21/24      02/26/24 0001  Dynamic Gait Index  Level Surface  3  Change in Gait Speed 2  Gait with Horizontal Head Turns 2  Gait with Vertical Head Turns 3  Gait and Pivot Turn 3  Step Over Obstacle 3  Step Around Obstacles 3  Steps 2  Total Score 21     GAIT: Distance walked: In clinic during DGI Assistive device utilized: Walker - 2 wheeled Level of assistance: Modified independence Comments: even step lengths bilat but diminished knee flexion during swing phase and limited knee ext during stance phase                                                                                                                                TREATMENT DATE: 03/07/24 NuStep L 5 x6 min L knee PROM w/ end range holds  L knee patellar mobs  Leg press 60lb 2x15, LLE 20lb 2x15 8in lateral step ups from airex x10 each  Heel raises 2x10 HS curls 35lb 2x10, LLE 20lb x10 Leg Ext 15lb 2x10, LLE 5lb 2x10  03/06/24 NuStep L5 x5 min Bike L3 x 3 min LLE eccentric step downs 4in x 10 Leg press 60lb 2x10, LLE 30lb 2x10 8in step ups x10 each HS curls 35lb 2x10, LLE 20lb x10 Leg Ext 15lb 2x10, LLE 5lb 2x10 L knee PROM w/ end range holds  L knee patellar mobs   02/26/24  Nustep x10 minutes for w/u and ROM prior to re-assessment   LEFS, objective measures as above and education on progress towards goals  HS stretch on steps 4x45 seconds  Knee extension stretch with 2# on knee x3 minutes     02/22/24  Bike x8 minutes seat 13-11 full rotations   R hip piriformis stretches 2x30 seconds L HS stretches 3x30 seconds STS with L LE staggered back/R forward x12  Quad stretch on 6 inch steps 3x30 seconds L  Lateral step ups 8 inch box x15 L LE  Forward step ups 8 inch box x15 L LE  L HS stretches on steps 3x30 seconds  Squats in // bars  with BUEs on bars 10x3 second holds for L knee flexion stretch    Knee extension overpressure to 5* passive Knee flexion overpressure stretches     PATIENT EDUCATION:  Education details: Exam findings, POC, initial HEP Person educated: Patient Education method: Programmer, multimedia, Demonstration, and Handouts Education comprehension: verbalized understanding, returned demonstration, and needs further education  HOME EXERCISE PROGRAM:  Access Code: [email or web address] URL: [email or web address] Date: 02/26/2024 Prepared by: Nedra Hai  Exercises - Standing Knee Flexion Stretch on Step  - 1 x daily - 7 x weekly - 2 sets - 10 reps - Long Sitting Quad Set with Towel Roll Under Heel  - 1 x daily - 7 x weekly - 2 sets - 10 reps - Supine Hamstring Stretch with Strap  - 1 x daily - 7 x weekly - 2 sets - 30 sec hold - Squat with Chair Touch  - 1 x daily -  7 x weekly - 3 sets - 10 reps - Step Up  - 1 x daily - 7 x weekly - 2 sets - 10 reps - Lateral Step Up  - 1 x daily - 7 x weekly - 2 sets - 10 reps - Forward Step Down Touch with Heel  - 1 x daily - 7 x weekly - 2 sets - 10 reps - Seated Knee Extension Stretch with Chair  - 2-3 x daily - 7 x weekly - 1 sets - 1 reps - 3-5 minutes  hold - Standing Hamstring Stretch with Step  - 2-3 x daily - 7 x weekly - 1 sets - 3 reps - 30 seconds  hold  ASSESSMENT:  CLINICAL IMPRESSION:  Pt arrives today doing well despite soreness from yesterdays session. Focused L knee strength and extension ROM. He is really doing well with all goals met except for ROM goal. Pain at end range with passive knee extensions. Cue to fully extend L knee with concentric phase of step downs. He would benefit from continued skilled PT services moving forward.    OBJECTIVE IMPAIRMENTS: Abnormal gait, decreased activity tolerance, decreased balance, decreased endurance, decreased mobility, difficulty walking, decreased ROM, decreased strength, hypomobility, increased fascial  restrictions, impaired flexibility, improper body mechanics, postural dysfunction, and pain.   ACTIVITY LIMITATIONS: carrying, lifting, bending, sitting, standing, squatting, sleeping, stairs, transfers, bed mobility, bathing, toileting, dressing, hygiene/grooming, and locomotion level  PARTICIPATION LIMITATIONS: meal prep, cleaning, driving, shopping, community activity, and yard work  PERSONAL FACTORS: Age, Fitness, Past/current experiences, and Time since onset of injury/illness/exacerbation are also affecting patient's functional outcome.   REHAB POTENTIAL: Good  CLINICAL DECISION MAKING: Evolving/moderate complexity  EVALUATION COMPLEXITY: Moderate   GOALS: Goals reviewed with patient? Yes   SHORT TERM GOALS: Target date: 03/11/2024     Independent with initial HEP. Baseline: Newly provided Goal status: Met  02/01/24  2.  Pt will be able to perform 5x STS without keeping L foot in front of R Baseline: Increased weight on R with L foot in front Goal status: Met  02/01/24  3.  Pt will have improved knee ROM to 10-90 deg Baseline: 35-68 deg in sitting Goal status: Met 02/01/24   LONG TERM GOALS: Target date:  03/25/2024     Independent with advanced/ongoing HEP to improve outcomes and carryover.  Baseline: Initial HEP provided Goal status: IN PROGRESS 02/26/24  2.  Alfonso Patten will demonstrate knee ROM from 5 to 120 deg to ascend/descend stairs. Baseline:  Goal status: Progressing 02/26/24  3.  JOHNPATRICK JENNY will be able to ambulate 1000' safely with LRAD and normal gait pattern to access community.  Baseline:  Goal status: Met 02/01/24  4.  HERSCHELL VIRANI will have improved LEFS to >/=42.5%  Baseline: 32.5% Goal status: MET 02/26/24  5.  RESHAUN BRISENO will demonstrate > 19/24 on DGI to demonstrate decreased risk of falls.   Baseline: 14/24 Goal status: MET 02/26/24  6.  Alfonso Patten will demonstrate improved functional LE strength by completing 5x STS in  <15 seconds.  (MCID 5 seconds) Baseline: 23 sec Goal status: Met 02/19/24 11.35 sec   PLAN:  PT FREQUENCY: 2x/week  PT DURATION: 4 weeks  PLANNED INTERVENTIONS: 97110-Therapeutic exercises, 97530- Therapeutic activity, [id number]- Neuromuscular re-education, 97535- Self Care, 16109- Manual therapy, [phone]- Gait training, and 97016- Vasopneumatic device  PLAN FOR NEXT SESSION: ROM focus   Debroah Baller, PTA 03/07/24 3:55 PM

## 2024-03-11 ENCOUNTER — Ambulatory Visit: Admitting: Physical Therapy

## 2024-03-11 ENCOUNTER — Encounter: Payer: Self-pay | Admitting: Physical Therapy

## 2024-03-11 DIAGNOSIS — R262 Difficulty in walking, not elsewhere classified: Secondary | ICD-10-CM

## 2024-03-11 DIAGNOSIS — M25562 Pain in left knee: Secondary | ICD-10-CM | POA: Diagnosis not present

## 2024-03-11 DIAGNOSIS — R600 Localized edema: Secondary | ICD-10-CM | POA: Diagnosis not present

## 2024-03-11 DIAGNOSIS — M25662 Stiffness of left knee, not elsewhere classified: Secondary | ICD-10-CM | POA: Diagnosis not present

## 2024-03-11 DIAGNOSIS — M6281 Muscle weakness (generalized): Secondary | ICD-10-CM

## 2024-03-11 NOTE — Therapy (Signed)
 OUTPATIENT PHYSICAL THERAPY LOWER EXTREMITY TREATMENT          Patient Name: Peter Blair MRN: [id number] DOB:[birth year], 83 y.o., male Today's Date: 03/11/2024  END OF SESSION:  PT End of Session - 03/11/24 1314     Visit Number 20    Date for PT Re-Evaluation 04/08/24    Authorization Type [employer]    Progress Note Due on Visit 27    PT Start Time 1303    PT Stop Time 1341    PT Time Calculation (min) 38 min    Activity Tolerance Patient tolerated treatment well    Behavior During Therapy WFL for tasks assessed/performed                    Past Medical History:  Diagnosis Date   Coronary artery disease    exertional chest pain, cath 12/10 showed EF 50-55% with mild inferior hypokensis, 99% proximal and 60% distal RCA stenosis, 80% prox stenosis of a moderate sized D1, 40% prox LAD. Patient had BMS x2 placed in RCA. Unstable Angina. PROMUS DES to the prox RCA   Diabetes mellitus without complication (HCC)    Erectile dysfunction    GERD (gastroesophageal reflux disease)    Hyperlipidemia    Hypertension    Nephrolithiasis    Dr. Achilles Blair   Renal cyst    Right shoulder pain    rotator cuff   Skin cancer    Skin cancer, PMH of, cell type unknown, Dr. Margo Blair   Past Surgical History:  Procedure Laterality Date   CATARACT EXTRACTION W/ INTRAOCULAR LENS IMPLANT Bilateral    colonoscopy with ploypectomy  03/05/2012    Dr Peter Blair, GI   CORONARY ANGIOPLASTY WITH STENT PLACEMENT     X 3 total stents   cystoscopic removal  04/04/2010   CYSTOSCOPY N/A 12/15/2023   Procedure: CYSTOSCOPY FLEXIBLE; URETHERAL DILATION; INSERTION OF FOLEY CATHETER;  Surgeon: Peter Fat, MD;  Location: [facility];  Service: Urology;  Laterality: N/A;   Flexibe sigmoidoscopy   12/06/1999   LITHOTRIPSY     x 1   RECONSTRUCTION OF EYELID Left    TOTAL KNEE ARTHROPLASTY Left 12/15/2023   Procedure: LEFT TOTAL KNEE ARTHROPLASTY;  Surgeon: Peter Hitch, MD;  Location: [facility];  Service: Orthopedics;  Laterality: Left;   Patient Active Problem List   Diagnosis Date Noted   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/15/2023   Unilateral primary osteoarthritis, right hip 09/25/2023   Actinic keratosis 10/17/2018   Type 2 diabetes mellitus with other circulatory complications (HCC) 12/11/2017   Osteoarthritis, multiple sites 10/11/2017   Preventative health care 08/05/2015   Advance directive discussed with patient 08/05/2015   GERD (gastroesophageal reflux disease)    Hyperplastic colonic polyp 12/17/2013   Benign prostatic hyperplasia with urinary obstruction 01/16/2013   ED (erectile dysfunction) of organic origin 01/16/2013   Coronary atherosclerosis of native coronary artery 11/20/2009   NEPHROLITHIASIS 09/29/2009   RENAL CYST, RIGHT 09/29/2009   Hyperlipemia 09/16/2008   SKIN CANCER, HX OF 09/16/2008   Essential hypertension, benign 07/09/2007    PCP: Peter Schwalbe, MD  REFERRING PROVIDER: [name], MD  REFERRING DIAG:  [phone] (ICD-10-CM) - Unilateral primary osteoarthritis, left knee   THERAPY DIAG:  Localized edema  Difficulty in walking, not elsewhere classified  Muscle weakness (generalized)  Stiffness of left knee, not elsewhere classified  Acute pain of left knee  Rationale for Evaluation and Treatment: Rehabilitation  ONSET DATE: 12/15/23  SUBJECTIVE:   SUBJECTIVE STATEMENT:  Feeling good, not sure that PT will help for my hip because its done this before, and it went away by itself before. Sometimes it hurts real bad and sometimes it doesn't. I think moving more as my knee has felt better has just inflamed the hip and that's it.   PERTINENT HISTORY: R hip OA  PAIN:  Are you having pain? Yes: NPRS scale: 1-2/10 Pain location: R hip more than anything  Pain description: general soreness  Aggravating factors: doing too much, hip ABD   Relieving factors: pain meds   PRECAUTIONS: Fall  RED  FLAGS: None   WEIGHT BEARING RESTRICTIONS: No  FALLS:  Has patient fallen in last 6 months? Yes. Number of falls 1 in September. Fell in a ditch trying to chainsaw a tree that fell in the street  LIVING ENVIRONMENT: Lives with: lives with their spouse, Peter Blair Lives in: House/apartment Stairs: Yes: External: 3 steps; on right going up Has following equipment at home: [name] - 2 wheeled  OCCUPATION: Retired - likes to fish  PLOF: Independent  PATIENT GOALS: Get back to fishing and normal activities  NEXT MD VISIT: 02/28/24  OBJECTIVE:  Note: Objective measures were completed at Evaluation unless otherwise noted.  DIAGNOSTIC FINDINGS: xray 12/15/23 IMPRESSION: Interval total left knee arthroplasty without evidence of hardware failure.  PATIENT SURVEYS:  Lower Extremity Functional Score: 26 / 80 = 32.5 %; 02/26/24  50/80, 62.5%  COGNITION: Overall cognitive status: Within functional limits for tasks assessed     SENSATION: WFL  EDEMA:  L knee 50% bigger than R LE  MUSCLE LENGTH: Hamstrings: limited due to knee ROM on L Thomas test: Did not assess  HS severe limitation R, mod limitation L/limited by TKR ROM  Piriformis mild limitation L/WNL R  Hip flexor mod limitation per functional observation Quads: R mod limitation/L limited by TKR ROM   LOWER EXTREMITY ROM:  Active ROM Right eval Left eval Left 01/04/24 Left 01/15/24 Left 02/01/24 Left 02/05/24 02/12/24 Left AROM 02/22/24 02/26/24 AROM  03/07/24 L AROM  Hip flexion            Hip extension            Hip abduction            Hip adduction            Hip internal rotation            Hip external rotation            Knee flexion 125 68 90 87* AAROM, only able to get to 70* AROM due to pain  95 AAROM 106*, AROM 98* 102  108* 106  Knee extension -5 -35 -12 -6* AROM supine  10 6* seated quad set AROM with leg propped on PT's knee  9 5* PROM  11* 8  Ankle dorsiflexion            Ankle plantarflexion             Ankle inversion            Ankle eversion             (Blank rows = not tested)  LOWER EXTREMITY MMT:  MMT Right eval Left eval Left 02/26/24 Right 03/11/24 Left 03/11/24  Hip flexion 4+ 3+ 4+ 4+ 4+  Hip extension    3 3  Hip abduction 4 4-  4+ 4+  Hip adduction 5 5  Hip internal rotation       Hip external rotation       Knee flexion 5 3+ 4+    Knee extension 5 3- (limited due to ROM) 4+    Ankle dorsiflexion       Ankle plantarflexion       Ankle inversion       Ankle eversion        (Blank rows = not tested)  FUNCTIONAL TESTS:  5 times sit to stand: L LE forward with bilat UE support; 23 sec; 02/26/24 13 seconds no UEs  Dynamic Gait Index: 14/24; 02/26/24 21/24      02/26/24 0001  Dynamic Gait Index  Level Surface 3  Change in Gait Speed 2  Gait with Horizontal Head Turns 2  Gait with Vertical Head Turns 3  Gait and Pivot Turn 3  Step Over Obstacle 3  Step Around Obstacles 3  Steps 2  Total Score 21     GAIT: Distance walked: In clinic during DGI Assistive device utilized: Walker - 2 wheeled Level of assistance: Modified independence Comments: even step lengths bilat but diminished knee flexion during swing phase and limited knee ext during stance phase                                                                                                                                TREATMENT DATE:  03/11/24  Scifit bike seat 11 x8 minutes for ROM and tissue perfusion  MMT and flexibility check for hip, education on findings from his hips as well as how we can easily incorporate this into POC for his knee   Single leg bridges x10 B R LE hip flexor stretch 2x30 seconds  Piriformis stretch 2x30 seconds  Knee flexion stretch 12x5 second holds L  Forward step ups with eccentric lower back down 6 inch step x12           03/07/24 NuStep L 5 x6 min L knee PROM w/ end range holds  L knee patellar mobs  Leg press 60lb 2x15, LLE 20lb 2x15 8in lateral  step ups from airex x10 each  Heel raises 2x10 HS curls 35lb 2x10, LLE 20lb x10 Leg Ext 15lb 2x10, LLE 5lb 2x10       PATIENT EDUCATION:  Education details: Exam findings, POC, initial HEP Person educated: Patient Education method: Explanation, Demonstration, and Handouts Education comprehension: verbalized understanding, returned demonstration, and needs further education  HOME EXERCISE PROGRAM:  Access Code: [email or web address] URL: [email or web address] Date: 03/11/2024 Prepared by: Nedra Hai  Exercises - Standing Knee Flexion Stretch on Step  - 1 x daily - 7 x weekly - 2 sets - 10 reps - Long Sitting Quad Set with Towel Roll Under Heel  - 1 x daily - 7 x weekly - 2 sets - 10 reps - Supine Hamstring Stretch with Strap  - 1 x daily - 7 x weekly - 2 sets - 30 sec  hold - Squat with Chair Touch  - 1 x daily - 7 x weekly - 3 sets - 10 reps - Step Up  - 1 x daily - 7 x weekly - 2 sets - 10 reps - Lateral Step Up  - 1 x daily - 7 x weekly - 2 sets - 10 reps - Forward Step Down Touch with Heel  - 1 x daily - 7 x weekly - 2 sets - 10 reps - Seated Knee Extension Stretch with Chair  - 2-3 x daily - 7 x weekly - 1 sets - 1 reps - 3-5 minutes  hold - Standing Hamstring Stretch with Step  - 2-3 x daily - 7 x weekly - 1 sets - 3 reps - 30 seconds  hold - Single Leg Bridge  - 1 x daily - 7 x weekly - 1-2 sets - 10 reps - 1 second  hold - Supine Figure 4 Piriformis Stretch with Leg Extension  - 2 x daily - 7 x weekly - 1 sets - 2-3 reps - 30 seconds  hold - Modified Thomas Stretch  - 2 x daily - 7 x weekly - 1 sets - 2-3 reps - 30 seconds  hold  ASSESSMENT:  CLINICAL IMPRESSION:  Pt arrives today doing OK, perseverated a lot on regretting talking to MD about his hip pain "I should have just finished up my knee and not said anything to him". MD did send referral for his hip. We have already been working on his knee for some time and are very familiar with him at this clinic- got  some objectives for the hip, I do feel we can easily address this along with his additional remaining sessions for his knee.   OBJECTIVE IMPAIRMENTS: Abnormal gait, decreased activity tolerance, decreased balance, decreased endurance, decreased mobility, difficulty walking, decreased ROM, decreased strength, hypomobility, increased fascial restrictions, impaired flexibility, improper body mechanics, postural dysfunction, and pain.   ACTIVITY LIMITATIONS: carrying, lifting, bending, sitting, standing, squatting, sleeping, stairs, transfers, bed mobility, bathing, toileting, dressing, hygiene/grooming, and locomotion level  PARTICIPATION LIMITATIONS: meal prep, cleaning, driving, shopping, community activity, and yard work  PERSONAL FACTORS: Age, Fitness, Past/current experiences, and Time since onset of injury/illness/exacerbation are also affecting patient's functional outcome.   REHAB POTENTIAL: Good  CLINICAL DECISION MAKING: Evolving/moderate complexity  EVALUATION COMPLEXITY: Moderate   GOALS: Goals reviewed with patient? Yes   SHORT TERM GOALS: Target date: 03/11/2024     Independent with initial HEP. Baseline: Newly provided Goal status: Met  02/01/24  2.  Pt will be able to perform 5x STS without keeping L foot in front of R Baseline: Increased weight on R with L foot in front Goal status: Met  02/01/24  3.  Pt will have improved knee ROM to 10-90 deg Baseline: 35-68 deg in sitting Goal status: Met 02/01/24   LONG TERM GOALS: Target date:  03/25/2024     Independent with advanced/ongoing HEP to improve outcomes and carryover.  Baseline: Initial HEP provided Goal status: IN PROGRESS 02/26/24  2.  Alfonso Patten will demonstrate knee ROM from 5 to 120 deg to ascend/descend stairs. Baseline:  Goal status: Progressing 02/26/24  3.  CALEEL KINER will be able to ambulate 1000' safely with LRAD and normal gait pattern to access community.  Baseline:  Goal status: Met  02/01/24  4.  GWYNN CROSSLEY will have improved LEFS to >/=42.5%  Baseline: 32.5% Goal status: MET 02/26/24  5.  Alfonso Patten will  demonstrate > 19/24 on DGI to demonstrate decreased risk of falls.   Baseline: 14/24 Goal status: MET 02/26/24  6.  Alfonso Patten will demonstrate improved functional LE strength by completing 5x STS in <15 seconds.  (MCID 5 seconds) Baseline: 23 sec Goal status: Met 02/19/24 11.35 sec   PLAN:  PT FREQUENCY: 2x/week  PT DURATION: 4 weeks  PLANNED INTERVENTIONS: 97110-Therapeutic exercises, 97530- Therapeutic activity, 97112- Neuromuscular re-education, 97535- Self Care, 16109- Manual therapy, [id number]- Gait training, and 97016- Vasopneumatic device  PLAN FOR NEXT SESSION: ROM focus, hip flexibility and strength, manual as desired/as is helpful- checked hip out 4/7, no need for new POC will work in hip interventions as is   Nedra Hai, PT, DPT 03/11/24 1:43 PM

## 2024-03-13 ENCOUNTER — Ambulatory Visit: Admitting: Physical Therapy

## 2024-03-13 ENCOUNTER — Encounter: Payer: Self-pay | Admitting: Physical Therapy

## 2024-03-13 DIAGNOSIS — R600 Localized edema: Secondary | ICD-10-CM | POA: Diagnosis not present

## 2024-03-13 DIAGNOSIS — M25662 Stiffness of left knee, not elsewhere classified: Secondary | ICD-10-CM

## 2024-03-13 DIAGNOSIS — M6281 Muscle weakness (generalized): Secondary | ICD-10-CM

## 2024-03-13 DIAGNOSIS — M25562 Pain in left knee: Secondary | ICD-10-CM

## 2024-03-13 DIAGNOSIS — R262 Difficulty in walking, not elsewhere classified: Secondary | ICD-10-CM | POA: Diagnosis not present

## 2024-03-13 NOTE — Therapy (Signed)
 OUTPATIENT PHYSICAL THERAPY LOWER EXTREMITY TREATMENT          Patient Name: Peter Blair MRN: [id number] DOB:Apr 22, 1941, 83 y.o., male Today's Date: 03/13/2024  END OF SESSION:  PT End of Session - 03/13/24 1313     Visit Number 21    Date for PT Re-Evaluation 04/08/24    Authorization Type [employer]    Progress Note Due on Visit 27    PT Start Time 1301    PT Stop Time 1341    PT Time Calculation (min) 40 min    Activity Tolerance Patient tolerated treatment well    Behavior During Therapy WFL for tasks assessed/performed                     Past Medical History:  Diagnosis Date   Coronary artery disease    exertional chest pain, cath 12/10 showed EF 50-55% with mild inferior hypokensis, 99% proximal and 60% distal RCA stenosis, 80% prox stenosis of a moderate sized D1, 40% prox LAD. Patient had BMS x2 placed in RCA. Unstable Angina. PROMUS DES to the prox RCA   Diabetes mellitus without complication (HCC)    Erectile dysfunction    GERD (gastroesophageal reflux disease)    Hyperlipidemia    Hypertension    Nephrolithiasis    Dr. Achilles Dunk   Renal cyst    Right shoulder pain    rotator cuff   Skin cancer    Skin cancer, PMH of, cell type unknown, Dr. Margo Aye   Past Surgical History:  Procedure Laterality Date   CATARACT EXTRACTION W/ INTRAOCULAR LENS IMPLANT Bilateral    colonoscopy with ploypectomy  03/05/2012    Dr Jarold Motto, GI   CORONARY ANGIOPLASTY WITH STENT PLACEMENT     X 3 total stents   cystoscopic removal  04/04/2010   CYSTOSCOPY N/A 12/15/2023   Procedure: CYSTOSCOPY FLEXIBLE; URETHERAL DILATION; INSERTION OF FOLEY CATHETER;  Surgeon: [name], MD;  Location: [facility];  Service: Urology;  Laterality: N/A;   Flexibe sigmoidoscopy   12/06/1999   LITHOTRIPSY     x 1   RECONSTRUCTION OF EYELID Left    TOTAL KNEE ARTHROPLASTY Left 12/15/2023   Procedure: LEFT TOTAL KNEE ARTHROPLASTY;  Surgeon: [name], MD;  Location:  [facility];  Service: Orthopedics;  Laterality: Left;   Patient Active Problem List   Diagnosis Date Noted   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/15/2023   Unilateral primary osteoarthritis, right hip 09/25/2023   Actinic keratosis 10/17/2018   Type 2 diabetes mellitus with other circulatory complications (HCC) 12/11/2017   Osteoarthritis, multiple sites 10/11/2017   Preventative health care 08/05/2015   Advance directive discussed with patient 08/05/2015   GERD (gastroesophageal reflux disease)    Hyperplastic colonic polyp 12/17/2013   Benign prostatic hyperplasia with urinary obstruction 01/16/2013   ED (erectile dysfunction) of organic origin 01/16/2013   Coronary atherosclerosis of native coronary artery 11/20/2009   NEPHROLITHIASIS 09/29/2009   RENAL CYST, RIGHT 09/29/2009   Hyperlipemia 09/16/2008   SKIN CANCER, HX OF 09/16/2008   Essential hypertension, benign 07/09/2007    PCP: [name], MD  REFERRING PROVIDER: [name], MD  REFERRING DIAG:  [phone] (ICD-10-CM) - Unilateral primary osteoarthritis, left knee   THERAPY DIAG:  Localized edema  Difficulty in walking, not elsewhere classified  Muscle weakness (generalized)  Stiffness of left knee, not elsewhere classified  Acute pain of left knee  Rationale for Evaluation and Treatment: Rehabilitation  ONSET DATE:  12/15/23  SUBJECTIVE:   SUBJECTIVE STATEMENT:  Between what we did Monday and what I did yesterday at home, I could barely walk. Now both hips are hurting especially in the back. This goes back to my theory about the right hip, like this pain is coming from it doing something it wasn't used to doing. Its better today but I still feel it   PERTINENT HISTORY: R hip OA  PAIN:  Are you having pain? Yes: NPRS scale: 2/10 Pain location: B hips/upper legs, knee is ok  Pain description: general soreness  Aggravating factors: doing too much   Relieving factors: pain meds    PRECAUTIONS: Fall  RED FLAGS: None   WEIGHT BEARING RESTRICTIONS: No  FALLS:  Has patient fallen in last 6 months? Yes. Number of falls 1 in September. Fell in a ditch trying to chainsaw a tree that fell in the street  LIVING ENVIRONMENT: Lives with: lives with their spouse, [name] Lives in: House/apartment Stairs: Yes: External: 3 steps; on right going up Has following equipment at home: [name] - 2 wheeled  OCCUPATION: Retired - likes to fish  PLOF: Independent  PATIENT GOALS: Get back to fishing and normal activities  NEXT MD VISIT: 02/28/24  OBJECTIVE:  Note: Objective measures were completed at Evaluation unless otherwise noted.  DIAGNOSTIC FINDINGS: xray 12/15/23 IMPRESSION: Interval total left knee arthroplasty without evidence of hardware failure.  PATIENT SURVEYS:  Lower Extremity Functional Score: 26 / 80 = 32.5 %; 02/26/24  50/80, 62.5%  COGNITION: Overall cognitive status: Within functional limits for tasks assessed     SENSATION: WFL  EDEMA:  L knee 50% bigger than R LE  MUSCLE LENGTH: Hamstrings: limited due to knee ROM on L Thomas test: Did not assess  HS severe limitation R, mod limitation L/limited by TKR ROM  Piriformis mild limitation L/WNL R  Hip flexor mod limitation per functional observation Quads: R mod limitation/L limited by TKR ROM   LOWER EXTREMITY ROM:  Active ROM Right eval Left eval Left 01/04/24 Left 01/15/24 Left 02/01/24 Left 02/05/24 02/12/24 Left AROM 02/22/24 02/26/24 AROM  03/07/24 L AROM 03/13/24 L AROM  Hip flexion             Hip extension             Hip abduction             Hip adduction             Hip internal rotation             Hip external rotation             Knee flexion 125 68 90 87* AAROM, only able to get to 70* AROM due to pain  95 AAROM 106*, AROM 98* 102  108* 106 105*  Knee extension -5 -35 -12 -6* AROM supine  10 6* seated quad set AROM with leg propped on PT's knee  9 5* PROM  11* 8 4* LAQ    Ankle dorsiflexion             Ankle plantarflexion             Ankle inversion             Ankle eversion              (Blank rows = not tested)  LOWER EXTREMITY MMT:  MMT Right eval Left eval Left 02/26/24 Right 03/11/24 Left 03/11/24  Hip flexion 4+ 3+ 4+ 4+  4+  Hip extension    3 3  Hip abduction 4 4-  4+ 4+  Hip adduction 5 5     Hip internal rotation       Hip external rotation       Knee flexion 5 3+ 4+    Knee extension 5 3- (limited due to ROM) 4+    Ankle dorsiflexion       Ankle plantarflexion       Ankle inversion       Ankle eversion        (Blank rows = not tested)  FUNCTIONAL TESTS:  5 times sit to stand: L LE forward with bilat UE support; 23 sec; 02/26/24 13 seconds no UEs  Dynamic Gait Index: 14/24; 02/26/24 21/24      02/26/24 0001  Dynamic Gait Index  Level Surface 3  Change in Gait Speed 2  Gait with Horizontal Head Turns 2  Gait with Vertical Head Turns 3  Gait and Pivot Turn 3  Step Over Obstacle 3  Step Around Obstacles 3  Steps 2  Total Score 21     GAIT: Distance walked: In clinic during DGI Assistive device utilized: Walker - 2 wheeled Level of assistance: Modified independence Comments: even step lengths bilat but diminished knee flexion during swing phase and limited knee ext during stance phase                                                                                                                                TREATMENT DATE:  03/13/24  Scifit bike seat 11 x8 minutes L3 for ROM and tissue perfusion Door squats self selected depth x12 Standing hip hikes x12 B Standing hip hikes + ABD x12 B Forward step ups 8 inch box LLE x12  Lateral step ups 8 inch box LLE x12 Forward step downs 6 inch x12 L LE   Lateral hip excursions x20 for lateral thigh/hip stretch HS stretches 2x30  seconds B Pirifomis stretches 2x30 seconds B   Knee extension overpressure  Knee flexion overpressure   MHP to R hips in sidelying at EOS,  not included in billing (performed in extra time after skilled part of session had been completed)          03/11/24  Scifit bike seat 11 x8 minutes for ROM and tissue perfusion  MMT and flexibility check for hip, education on findings from his hips as well as how we can easily incorporate this into POC for his knee   Single leg bridges x10 B R LE hip flexor stretch 2x30 seconds  Piriformis stretch 2x30 seconds  Knee flexion stretch 12x5 second holds L  Forward step ups with eccentric lower back down 6 inch step x12           03/07/24 NuStep L 5 x6 min L knee PROM w/ end range holds  L knee patellar mobs  Leg press 60lb 2x15, LLE 20lb  2x15 8in lateral step ups from airex x10 each  Heel raises 2x10 HS curls 35lb 2x10, LLE 20lb x10 Leg Ext 15lb 2x10, LLE 5lb 2x10       PATIENT EDUCATION:  Education details: Exam findings, POC, initial HEP Person educated: Patient Education method: Explanation, Demonstration, and Handouts Education comprehension: verbalized understanding, returned demonstration, and needs further education  HOME EXERCISE PROGRAM:  Access Code: [email or web address] URL: [email or web address] Date: 03/11/2024 Prepared by: Nedra Hai  Exercises - Standing Knee Flexion Stretch on Step  - 1 x daily - 7 x weekly - 2 sets - 10 reps - Long Sitting Quad Set with Towel Roll Under Heel  - 1 x daily - 7 x weekly - 2 sets - 10 reps - Supine Hamstring Stretch with Strap  - 1 x daily - 7 x weekly - 2 sets - 30 sec hold - Squat with Chair Touch  - 1 x daily - 7 x weekly - 3 sets - 10 reps - Step Up  - 1 x daily - 7 x weekly - 2 sets - 10 reps - Lateral Step Up  - 1 x daily - 7 x weekly - 2 sets - 10 reps - Forward Step Down Touch with Heel  - 1 x daily - 7 x weekly - 2 sets - 10 reps - Seated Knee Extension Stretch with Chair  - 2-3 x daily - 7 x weekly - 1 sets - 1 reps - 3-5 minutes  hold - Standing Hamstring Stretch with Step  - 2-3 x daily - 7 x  weekly - 1 sets - 3 reps - 30 seconds  hold - Single Leg Bridge  - 1 x daily - 7 x weekly - 1-2 sets - 10 reps - 1 second  hold - Supine Figure 4 Piriformis Stretch with Leg Extension  - 2 x daily - 7 x weekly - 1 sets - 2-3 reps - 30 seconds  hold - Modified Thomas Stretch  - 2 x daily - 7 x weekly - 1 sets - 2-3 reps - 30 seconds  hold  ASSESSMENT:  CLINICAL IMPRESSION:  Pt arrives today doing OK, sounds like he had quite a bit of mm soreness especially after single leg bridges- advised him to cut reps for this exercise in half at home to help address this. Otherwise kept working on a mix of knee and hip interventions today with modifications to resistance/difficulty of exercises PRN.    OBJECTIVE IMPAIRMENTS: Abnormal gait, decreased activity tolerance, decreased balance, decreased endurance, decreased mobility, difficulty walking, decreased ROM, decreased strength, hypomobility, increased fascial restrictions, impaired flexibility, improper body mechanics, postural dysfunction, and pain.   ACTIVITY LIMITATIONS: carrying, lifting, bending, sitting, standing, squatting, sleeping, stairs, transfers, bed mobility, bathing, toileting, dressing, hygiene/grooming, and locomotion level  PARTICIPATION LIMITATIONS: meal prep, cleaning, driving, shopping, community activity, and yard work  PERSONAL FACTORS: Age, Fitness, Past/current experiences, and Time since onset of injury/illness/exacerbation are also affecting patient's functional outcome.   REHAB POTENTIAL: Good  CLINICAL DECISION MAKING: Evolving/moderate complexity  EVALUATION COMPLEXITY: Moderate   GOALS: Goals reviewed with patient? Yes   SHORT TERM GOALS: Target date: 03/11/2024     Independent with initial HEP. Baseline: Newly provided Goal status: Met  02/01/24  2.  Pt will be able to perform 5x STS without keeping L foot in front of R Baseline: Increased weight on R with L foot in front Goal status: Met  02/01/24  3.  Pt  will have improved knee ROM  to 10-90 deg Baseline: 35-68 deg in sitting Goal status: Met 02/01/24   LONG TERM GOALS: Target date:  03/25/2024     Independent with advanced/ongoing HEP to improve outcomes and carryover.  Baseline: Initial HEP provided Goal status: IN PROGRESS 02/26/24  2.  Alfonso Patten will demonstrate knee ROM from 5 to 120 deg to ascend/descend stairs. Baseline:  Goal status: Progressing 02/26/24  3.  SABASTIAN RAIMONDI will be able to ambulate 1000' safely with LRAD and normal gait pattern to access community.  Baseline:  Goal status: Met 02/01/24  4.  VADIM CENTOLA will have improved LEFS to >/=42.5%  Baseline: 32.5% Goal status: MET 02/26/24  5.  ARISTEO HANKERSON will demonstrate > 19/24 on DGI to demonstrate decreased risk of falls.   Baseline: 14/24 Goal status: MET 02/26/24  6.  Alfonso Patten will demonstrate improved functional LE strength by completing 5x STS in <15 seconds.  (MCID 5 seconds) Baseline: 23 sec Goal status: Met 02/19/24 11.35 sec   PLAN:  PT FREQUENCY: 2x/week  PT DURATION: 4 weeks  PLANNED INTERVENTIONS: 97110-Therapeutic exercises, 97530- Therapeutic activity, 97112- Neuromuscular re-education, 97535- Self Care, 16109- Manual therapy, [phone]- Gait training, and [id number]- Vasopneumatic device  PLAN FOR NEXT SESSION: ROM focus, hip flexibility and strength, manual as desired/as is helpful- checked hip out 4/7, no need for new POC will work in hip interventions   Nedra Hai, PT, DPT 03/13/24 1:43 PM

## 2024-03-18 ENCOUNTER — Encounter: Payer: Self-pay | Admitting: Physical Therapy

## 2024-03-18 ENCOUNTER — Ambulatory Visit: Admitting: Physical Therapy

## 2024-03-18 DIAGNOSIS — M6281 Muscle weakness (generalized): Secondary | ICD-10-CM | POA: Diagnosis not present

## 2024-03-18 DIAGNOSIS — M25662 Stiffness of left knee, not elsewhere classified: Secondary | ICD-10-CM | POA: Diagnosis not present

## 2024-03-18 DIAGNOSIS — R262 Difficulty in walking, not elsewhere classified: Secondary | ICD-10-CM | POA: Diagnosis not present

## 2024-03-18 DIAGNOSIS — M25562 Pain in left knee: Secondary | ICD-10-CM | POA: Diagnosis not present

## 2024-03-18 DIAGNOSIS — R600 Localized edema: Secondary | ICD-10-CM | POA: Diagnosis not present

## 2024-03-18 NOTE — Therapy (Signed)
 OUTPATIENT PHYSICAL THERAPY LOWER EXTREMITY TREATMENT          Patient Name: Peter Blair MRN: [id number] DOB:[birth year], 83 y.o., male Today's Date: 03/18/2024  END OF SESSION:  PT End of Session - 03/18/24 1142     Visit Number 22    Number of Visits 17    Date for PT Re-Evaluation 04/08/24    PT Start Time 1145    PT Stop Time 1230    PT Time Calculation (min) 45 min                     Past Medical History:  Diagnosis Date   Coronary artery disease    exertional chest pain, cath 12/10 showed EF 50-55% with mild inferior hypokensis, 99% proximal and 60% distal RCA stenosis, 80% prox stenosis of a moderate sized D1, 40% prox LAD. Patient had BMS x2 placed in RCA. Unstable Angina. PROMUS DES to the prox RCA   Diabetes mellitus without complication (HCC)    Erectile dysfunction    GERD (gastroesophageal reflux disease)    Hyperlipidemia    Hypertension    Nephrolithiasis    Dr. Aram Knights   Renal cyst    Right shoulder pain    rotator cuff   Skin cancer    Skin cancer, PMH of, cell type unknown, Dr. Del Favia   Past Surgical History:  Procedure Laterality Date   CATARACT EXTRACTION W/ INTRAOCULAR LENS IMPLANT Bilateral    colonoscopy with ploypectomy  03/05/2012    Dr Adan Holms, GI   CORONARY ANGIOPLASTY WITH STENT PLACEMENT     X 3 total stents   cystoscopic removal  04/04/2010   CYSTOSCOPY N/A 12/15/2023   Procedure: CYSTOSCOPY FLEXIBLE; URETHERAL DILATION; INSERTION OF FOLEY CATHETER;  Surgeon: Andrez Banker, MD;  Location: [facility];  Service: Urology;  Laterality: N/A;   Flexibe sigmoidoscopy   12/06/1999   LITHOTRIPSY     x 1   RECONSTRUCTION OF EYELID Left    TOTAL KNEE ARTHROPLASTY Left 12/15/2023   Procedure: LEFT TOTAL KNEE ARTHROPLASTY;  Surgeon: Arnie Lao, MD;  Location: [facility];  Service: Orthopedics;  Laterality: Left;   Patient Active Problem List   Diagnosis Date Noted   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/15/2023    Unilateral primary osteoarthritis, right hip 09/25/2023   Actinic keratosis 10/17/2018   Type 2 diabetes mellitus with other circulatory complications (HCC) 12/11/2017   Osteoarthritis, multiple sites 10/11/2017   Preventative health care 08/05/2015   Advance directive discussed with patient 08/05/2015   GERD (gastroesophageal reflux disease)    Hyperplastic colonic polyp 12/17/2013   Benign prostatic hyperplasia with urinary obstruction 01/16/2013   ED (erectile dysfunction) of organic origin 01/16/2013   Coronary atherosclerosis of native coronary artery 11/20/2009   NEPHROLITHIASIS 09/29/2009   RENAL CYST, RIGHT 09/29/2009   Hyperlipemia 09/16/2008   SKIN CANCER, HX OF 09/16/2008   Essential hypertension, benign 07/09/2007    PCP: Helaine Llanos, MD  REFERRING PROVIDER: Arnie Lao, MD  REFERRING DIAG:  [phone] (ICD-10-CM) - Unilateral primary osteoarthritis, left knee   THERAPY DIAG:  Difficulty in walking, not elsewhere classified  Muscle weakness (generalized)  Stiffness of left knee, not elsewhere classified  Acute pain of left knee  Rationale for Evaluation and Treatment: Rehabilitation  ONSET DATE: 12/15/23  SUBJECTIVE:   SUBJECTIVE STATEMENT: He isn't having any knee pain but his hip is painful. R hip is sore after working on his car yesterday. He has been  doing his HEP sometimes but it makes him sore and he doesn't think it is helping.  Between what we did Monday and what I did yesterday at home, I could barely walk. Now both hips are hurting especially in the back. This goes back to my theory about the right hip, like this pain is coming from it doing something it wasn't used to doing. Its better today but I still feel it   PERTINENT HISTORY: R hip OA  PAIN:  Are you having pain? Yes: NPRS scale: 2/10 Pain location: B hips/upper legs, knee is ok  Pain description: general soreness  Aggravating factors: doing too much   Relieving factors:  pain meds   PRECAUTIONS: Fall  RED FLAGS: None   WEIGHT BEARING RESTRICTIONS: No  FALLS:  Has patient fallen in last 6 months? Yes. Number of falls 1 in September. Fell in a ditch trying to chainsaw a tree that fell in the street  LIVING ENVIRONMENT: Lives with: lives with their spouse, Peter Blair Lives in: House/apartment Stairs: Yes: External: 3 steps; on right going up Has following equipment at home: Otho Blitz - 2 wheeled  OCCUPATION: Retired - likes to fish  PLOF: Independent  PATIENT GOALS: Get back to fishing and normal activities  NEXT MD VISIT: 02/28/24  OBJECTIVE:  Note: Objective measures were completed at Evaluation unless otherwise noted.  DIAGNOSTIC FINDINGS: xray 12/15/23 IMPRESSION: Interval total left knee arthroplasty without evidence of hardware failure.  PATIENT SURVEYS:  Lower Extremity Functional Score: 26 / 80 = 32.5 %; 02/26/24  50/80, 62.5%  COGNITION: Overall cognitive status: Within functional limits for tasks assessed     SENSATION: WFL  EDEMA:  L knee 50% bigger than R LE  MUSCLE LENGTH: Hamstrings: limited due to knee ROM on L Thomas test: Did not assess  HS severe limitation R, mod limitation L/limited by TKR ROM  Piriformis mild limitation L/WNL R  Hip flexor mod limitation per functional observation Quads: R mod limitation/L limited by TKR ROM   LOWER EXTREMITY ROM:  Active ROM Right eval Left eval Left 01/04/24 Left 01/15/24 Left 02/01/24 Left 02/05/24 02/12/24 Left AROM 02/22/24 02/26/24 AROM  03/07/24 L AROM 03/13/24 L AROM  Hip flexion             Hip extension             Hip abduction             Hip adduction             Hip internal rotation             Hip external rotation             Knee flexion 125 68 90 87* AAROM, only able to get to 70* AROM due to pain  95 AAROM 106*, AROM 98* 102  108* 106 105*  Knee extension -5 -35 -12 -6* AROM supine  10 6* seated quad set AROM with leg propped on PT's knee  9 5* PROM  11* 8 4*  LAQ   Ankle dorsiflexion             Ankle plantarflexion             Ankle inversion             Ankle eversion              (Blank rows = not tested)  LOWER EXTREMITY MMT:  MMT Right eval Left eval Left 02/26/24 Right 03/11/24  Left 03/11/24  Hip flexion 4+ 3+ 4+ 4+ 4+  Hip extension    3 3  Hip abduction 4 4-  4+ 4+  Hip adduction 5 5     Hip internal rotation       Hip external rotation       Knee flexion 5 3+ 4+    Knee extension 5 3- (limited due to ROM) 4+    Ankle dorsiflexion       Ankle plantarflexion       Ankle inversion       Ankle eversion        (Blank rows = not tested)  FUNCTIONAL TESTS:  5 times sit to stand: L LE forward with bilat UE support; 23 sec; 02/26/24 13 seconds no UEs  Dynamic Gait Index: 14/24; 02/26/24 21/24      02/26/24 0001  Dynamic Gait Index  Level Surface 3  Change in Gait Speed 2  Gait with Horizontal Head Turns 2  Gait with Vertical Head Turns 3  Gait and Pivot Turn 3  Step Over Obstacle 3  Step Around Obstacles 3  Steps 2  Total Score 21     GAIT: Distance walked: In clinic during DGI Assistive device utilized: Walker - 2 wheeled Level of assistance: Modified independence Comments: even step lengths bilat but diminished knee flexion during swing phase and limited knee ext during stance phase                                                                                                                                TREATMENT DATE: 03/18/24 Scifit bike seat 13 x8 minutes L3.5 for ROM and tissue perfusion PROM hip and knee Sit to stand x5 4in step up x10 each 6in step up with hip extension x10 6in lateral step up with hip abduction x10 each Bridges x10 , x10 with ball btwn knees Piriformis stretch March in place 2x20 2# Resisted gait forward and backward x10 each       03/13/24  Scifit bike seat 11 x8 minutes L3 for ROM and tissue perfusion Door squats self selected depth x12 Standing hip hikes x12 B Standing  hip hikes + ABD x12 B Forward step ups 8 inch box LLE x12  Lateral step ups 8 inch box LLE x12 Forward step downs 6 inch x12 L LE   Lateral hip excursions x20 for lateral thigh/hip stretch HS stretches 2x30  seconds B Pirifomis stretches 2x30 seconds B   Knee extension overpressure  Knee flexion overpressure   MHP to R hips in sidelying at EOS, not included in billing (performed in extra time after skilled part of session had been completed)          03/11/24  Scifit bike seat 11 x8 minutes for ROM and tissue perfusion  MMT and flexibility check for hip, education on findings from his hips as well as how we can easily incorporate this into POC  for his knee   Single leg bridges x10 B R LE hip flexor stretch 2x30 seconds  Piriformis stretch 2x30 seconds  Knee flexion stretch 12x5 second holds L  Forward step ups with eccentric lower back down 6 inch step x12           03/07/24 NuStep L 5 x6 min L knee PROM w/ end range holds  L knee patellar mobs  Leg press 60lb 2x15, LLE 20lb 2x15 8in lateral step ups from airex x10 each  Heel raises 2x10 HS curls 35lb 2x10, LLE 20lb x10 Leg Ext 15lb 2x10, LLE 5lb 2x10       PATIENT EDUCATION:  Education details: Exam findings, POC, initial HEP Person educated: Patient Education method: Explanation, Demonstration, and Handouts Education comprehension: verbalized understanding, returned demonstration, and needs further education  HOME EXERCISE PROGRAM:  Access Code: [email or web address] URL: [email or web address] Date: 03/11/2024 Prepared by: Terrel Ferries  Exercises - Standing Knee Flexion Stretch on Step  - 1 x daily - 7 x weekly - 2 sets - 10 reps - Long Sitting Quad Set with Towel Roll Under Heel  - 1 x daily - 7 x weekly - 2 sets - 10 reps - Supine Hamstring Stretch with Strap  - 1 x daily - 7 x weekly - 2 sets - 30 sec hold - Squat with Chair Touch  - 1 x daily - 7 x weekly - 3 sets - 10 reps - Step Up  -  1 x daily - 7 x weekly - 2 sets - 10 reps - Lateral Step Up  - 1 x daily - 7 x weekly - 2 sets - 10 reps - Forward Step Down Touch with Heel  - 1 x daily - 7 x weekly - 2 sets - 10 reps - Seated Knee Extension Stretch with Chair  - 2-3 x daily - 7 x weekly - 1 sets - 1 reps - 3-5 minutes  hold - Standing Hamstring Stretch with Step  - 2-3 x daily - 7 x weekly - 1 sets - 3 reps - 30 seconds  hold - Single Leg Bridge  - 1 x daily - 7 x weekly - 1-2 sets - 10 reps - 1 second  hold - Supine Figure 4 Piriformis Stretch with Leg Extension  - 2 x daily - 7 x weekly - 1 sets - 2-3 reps - 30 seconds  hold - Modified Thomas Stretch  - 2 x daily - 7 x weekly - 1 sets - 2-3 reps - 30 seconds  hold  ASSESSMENT:  CLINICAL IMPRESSION: Pt arrived with no knee pain but complained of some hip pain and tightness after working on his car this weekend. He has been doing his HEP and emphasizing TKE. We worked on Loss adjuster, chartered. He required verbal cues during his step ups to prevent lateral trunk bending and engage his hip muscles. He was given alternative exercises for his HEP when his hip is aggravated (bridge with both feet down instead of 1).     OBJECTIVE IMPAIRMENTS: Abnormal gait, decreased activity tolerance, decreased balance, decreased endurance, decreased mobility, difficulty walking, decreased ROM, decreased strength, hypomobility, increased fascial restrictions, impaired flexibility, improper body mechanics, postural dysfunction, and pain.   ACTIVITY LIMITATIONS: carrying, lifting, bending, sitting, standing, squatting, sleeping, stairs, transfers, bed mobility, bathing, toileting, dressing, hygiene/grooming, and locomotion level  PARTICIPATION LIMITATIONS: meal prep, cleaning, driving, shopping, community activity, and yard work  PERSONAL FACTORS: Age, Fitness, Past/current experiences, and Time since onset  of injury/illness/exacerbation are also affecting patient's functional outcome.    REHAB POTENTIAL: Good  CLINICAL DECISION MAKING: Evolving/moderate complexity  EVALUATION COMPLEXITY: Moderate   GOALS: Goals reviewed with patient? Yes   SHORT TERM GOALS: Target date: 03/11/2024     Independent with initial HEP. Baseline: Newly provided Goal status: Met  02/01/24  2.  Pt will be able to perform 5x STS without keeping L foot in front of R Baseline: Increased weight on R with L foot in front Goal status: Met  02/01/24  3.  Pt will have improved knee ROM to 10-90 deg Baseline: 35-68 deg in sitting Goal status: Met 02/01/24   LONG TERM GOALS: Target date:  03/25/2024     Independent with advanced/ongoing HEP to improve outcomes and carryover.  Baseline: Initial HEP provided Goal status: IN PROGRESS 02/26/24  2.  Peter Blair will demonstrate knee ROM from 5 to 120 deg to ascend/descend stairs. Baseline:  Goal status: Progressing 02/26/24  3.  Peter Blair will be able to ambulate 1000' safely with LRAD and normal gait pattern to access community.  Baseline:  Goal status: Met 02/01/24  4.  Peter Blair will have improved LEFS to >/=42.5%  Baseline: 32.5% Goal status: MET 02/26/24  5.  Peter Blair will demonstrate > 19/24 on DGI to demonstrate decreased risk of falls.   Baseline: 14/24 Goal status: MET 02/26/24  6.  Peter Blair will demonstrate improved functional LE strength by completing 5x STS in <15 seconds.  (MCID 5 seconds) Baseline: 23 sec Goal status: Met 02/19/24 11.35 sec   PLAN:  PT FREQUENCY: 2x/week  PT DURATION: 4 weeks  PLANNED INTERVENTIONS: 97110-Therapeutic exercises, 97530- Therapeutic activity, 97112- Neuromuscular re-education, 97535- Self Care, 08657- Manual therapy, [phone]- Gait training, and 97016- Vasopneumatic device  PLAN FOR NEXT SESSION: ROM focus, hip flexibility and strength, manual as desired/as is helpful- checked hip out 4/7, no need for new POC will work in hip interventions   Terrel Ferries, PT,  DPT 03/18/24 11:44 AM   [facility] [address] [facility] [place], [place], [address] Phone: [phone]   Fax:  [phone]  Patient Details  Name: Peter Blair MRN: [id number] Date of Birth: 09-May-1941 Referring Provider:  Arnie Lao*  Encounter Date: 03/18/2024   Laurelyn Ponder 03/18/2024, 11:44 AM  [facility] [address] [facility] [place], [place], [address] Phone: [phone]   Fax:  [phone]

## 2024-03-20 ENCOUNTER — Ambulatory Visit: Admitting: Physical Therapy

## 2024-03-20 ENCOUNTER — Encounter: Payer: Self-pay | Admitting: Physical Therapy

## 2024-03-20 DIAGNOSIS — M25562 Pain in left knee: Secondary | ICD-10-CM | POA: Diagnosis not present

## 2024-03-20 DIAGNOSIS — M6281 Muscle weakness (generalized): Secondary | ICD-10-CM | POA: Diagnosis not present

## 2024-03-20 DIAGNOSIS — M25662 Stiffness of left knee, not elsewhere classified: Secondary | ICD-10-CM | POA: Diagnosis not present

## 2024-03-20 DIAGNOSIS — R262 Difficulty in walking, not elsewhere classified: Secondary | ICD-10-CM | POA: Diagnosis not present

## 2024-03-20 DIAGNOSIS — R600 Localized edema: Secondary | ICD-10-CM | POA: Diagnosis not present

## 2024-03-20 NOTE — Therapy (Signed)
 OUTPATIENT PHYSICAL THERAPY LOWER EXTREMITY TREATMENT          Patient Name: Peter Blair MRN: [id number] DOB:December 14, 1940, 83 y.o., male Today's Date: 03/20/2024  END OF SESSION:  PT End of Session - 03/20/24 1256     Visit Number 23    Number of Visits 17    Date for PT Re-Evaluation 04/08/24    PT Start Time 1300    PT Stop Time 1345    PT Time Calculation (min) 45 min                     Past Medical History:  Diagnosis Date   Coronary artery disease    exertional chest pain, cath 12/10 showed EF 50-55% with mild inferior hypokensis, 99% proximal and 60% distal RCA stenosis, 80% prox stenosis of a moderate sized D1, 40% prox LAD. Patient had BMS x2 placed in RCA. Unstable Angina. PROMUS DES to the prox RCA   Diabetes mellitus without complication (HCC)    Erectile dysfunction    GERD (gastroesophageal reflux disease)    Hyperlipidemia    Hypertension    Nephrolithiasis    Dr. Aram Knights   Renal cyst    Right shoulder pain    rotator cuff   Skin cancer    Skin cancer, PMH of, cell type unknown, Dr. Del Favia   Past Surgical History:  Procedure Laterality Date   CATARACT EXTRACTION W/ INTRAOCULAR LENS IMPLANT Bilateral    colonoscopy with ploypectomy  03/05/2012    Dr Adan Holms, GI   CORONARY ANGIOPLASTY WITH STENT PLACEMENT     X 3 total stents   cystoscopic removal  04/04/2010   CYSTOSCOPY N/A 12/15/2023   Procedure: CYSTOSCOPY FLEXIBLE; URETHERAL DILATION; INSERTION OF FOLEY CATHETER;  Surgeon: Andrez Banker, MD;  Location: [facility];  Service: Urology;  Laterality: N/A;   Flexibe sigmoidoscopy   12/06/1999   LITHOTRIPSY     x 1   RECONSTRUCTION OF EYELID Left    TOTAL KNEE ARTHROPLASTY Left 12/15/2023   Procedure: LEFT TOTAL KNEE ARTHROPLASTY;  Surgeon: Arnie Lao, MD;  Location: [facility];  Service: Orthopedics;  Laterality: Left;   Patient Active Problem List   Diagnosis Date Noted   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/15/2023    Unilateral primary osteoarthritis, right hip 09/25/2023   Actinic keratosis 10/17/2018   Type 2 diabetes mellitus with other circulatory complications (HCC) 12/11/2017   Osteoarthritis, multiple sites 10/11/2017   Preventative health care 08/05/2015   Advance directive discussed with patient 08/05/2015   GERD (gastroesophageal reflux disease)    Hyperplastic colonic polyp 12/17/2013   Benign prostatic hyperplasia with urinary obstruction 01/16/2013   ED (erectile dysfunction) of organic origin 01/16/2013   Coronary atherosclerosis of native coronary artery 11/20/2009   NEPHROLITHIASIS 09/29/2009   RENAL CYST, RIGHT 09/29/2009   Hyperlipemia 09/16/2008   SKIN CANCER, HX OF 09/16/2008   Essential hypertension, benign 07/09/2007    PCP: Helaine Llanos, MD  REFERRING PROVIDER: Arnie Lao, MD  REFERRING DIAG:  [phone] (ICD-10-CM) - Unilateral primary osteoarthritis, left knee   THERAPY DIAG:  Difficulty in walking, not elsewhere classified  Muscle weakness (generalized)  Stiffness of left knee, not elsewhere classified  Acute pain of left knee  Rationale for Evaluation and Treatment: Rehabilitation  ONSET DATE: 12/15/23  SUBJECTIVE:   SUBJECTIVE STATEMENT: I did some exercises that made me sore yesterday but I think they are helping. R hip hurts a little when I walk or  stand for too long.  PERTINENT HISTORY: R hip OA  PAIN:  Are you having pain? Yes: NPRS scale: 1/10 Pain location: B hips/upper legs, knee is ok  Pain description: general soreness  Aggravating factors: doing too much   Relieving factors: pain meds   PRECAUTIONS: Fall  RED FLAGS: None   WEIGHT BEARING RESTRICTIONS: No  FALLS:  Has patient fallen in last 6 months? Yes. Number of falls 1 in September. Fell in a ditch trying to chainsaw a tree that fell in the street  LIVING ENVIRONMENT: Lives with: lives with their spouse, [name] Lives in: House/apartment Stairs: Yes: External: 3  steps; on right going up Has following equipment at home: [name] - 2 wheeled  OCCUPATION: Retired - likes to fish  PLOF: Independent  PATIENT GOALS: Get back to fishing and normal activities  NEXT MD VISIT: 02/28/24  OBJECTIVE:  Note: Objective measures were completed at Evaluation unless otherwise noted.  DIAGNOSTIC FINDINGS: xray 12/15/23 IMPRESSION: Interval total left knee arthroplasty without evidence of hardware failure.  PATIENT SURVEYS:  Lower Extremity Functional Score: 26 / 80 = 32.5 %; 02/26/24  50/80, 62.5%  COGNITION: Overall cognitive status: Within functional limits for tasks assessed     SENSATION: WFL  EDEMA:  L knee 50% bigger than R LE  MUSCLE LENGTH: Hamstrings: limited due to knee ROM on L Thomas test: Did not assess  HS severe limitation R, mod limitation L/limited by TKR ROM  Piriformis mild limitation L/WNL R  Hip flexor mod limitation per functional observation Quads: R mod limitation/L limited by TKR ROM   LOWER EXTREMITY ROM:  Active ROM Right eval Left eval Left 01/04/24 Left 01/15/24 Left 02/01/24 Left 02/05/24 02/12/24 Left AROM 02/22/24 02/26/24 AROM  03/07/24 L AROM 03/13/24 L AROM  Hip flexion             Hip extension             Hip abduction             Hip adduction             Hip internal rotation             Hip external rotation             Knee flexion 125 68 90 87* AAROM, only able to get to 70* AROM due to pain  95 AAROM 106*, AROM 98* 102  108* 106 105*  Knee extension -5 -35 -12 -6* AROM supine  10 6* seated quad set AROM with leg propped on PT's knee  9 5* PROM  11* 8 4* LAQ   Ankle dorsiflexion             Ankle plantarflexion             Ankle inversion             Ankle eversion              (Blank rows = not tested)  LOWER EXTREMITY MMT:  MMT Right eval Left eval Left 02/26/24 Right 03/11/24 Left 03/11/24  Hip flexion 4+ 3+ 4+ 4+ 4+  Hip extension    3 3  Hip abduction 4 4-  4+ 4+  Hip adduction 5 5     Hip  internal rotation       Hip external rotation       Knee flexion 5 3+ 4+    Knee extension 5 3- (limited due to ROM) 4+  Ankle dorsiflexion       Ankle plantarflexion       Ankle inversion       Ankle eversion        (Blank rows = not tested)  FUNCTIONAL TESTS:  5 times sit to stand: L LE forward with bilat UE support; 23 sec; 02/26/24 13 seconds no UEs  Dynamic Gait Index: 14/24; 02/26/24 21/24      02/26/24 0001  Dynamic Gait Index  Level Surface 3  Change in Gait Speed 2  Gait with Horizontal Head Turns 2  Gait with Vertical Head Turns 3  Gait and Pivot Turn 3  Step Over Obstacle 3  Step Around Obstacles 3  Steps 2  Total Score 21     GAIT: Distance walked: In clinic during DGI Assistive device utilized: Walker - 2 wheeled Level of assistance: Modified independence Comments: even step lengths bilat but diminished knee flexion during swing phase and limited knee ext during stance phase                                                                                                                                TREATMENT DATE: 03/20/24 Bike L3 PROM  Rt. Knee flex and ext B hip flex, ext, IR,ER -Rt hip is tighter than Lt L patellar mobs HS curls 30lb 2x10, LLE 20lb x8 Leg Ext 15lb 2x10, LLE 5lb x8 Leg press 60# 2x10 Heel raises 2x10 20# March in place 2x20 3# HHA x2 for the first set  Alt standing Hip abd 2x20 3# Standing Hip ext 2x20 3# 6in step up from airex x10 6in step up on  airex x10       03/18/24 Scifit bike seat 13 x8 minutes L3.5 for ROM and tissue perfusion PROM hip and knee Sit to stand x5 4in step up x10 each 6in step up with hip extension x10 6in lateral step up with hip abduction x10 each Bridges x10 , x10 with ball btwn knees Piriformis stretch March in place 2x20 2# Resisted gait forward and backward x10 each       03/13/24  Scifit bike seat 11 x8 minutes L3 for ROM and tissue perfusion Door squats self selected depth  x12 Standing hip hikes x12 B Standing hip hikes + ABD x12 B Forward step ups 8 inch box LLE x12  Lateral step ups 8 inch box LLE x12 Forward step downs 6 inch x12 L LE   Lateral hip excursions x20 for lateral thigh/hip stretch HS stretches 2x30  seconds B Pirifomis stretches 2x30 seconds B   Knee extension overpressure  Knee flexion overpressure   MHP to R hips in sidelying at EOS, not included in billing (performed in extra time after skilled part of session had been completed)          03/11/24  Scifit bike seat 11 x8 minutes for ROM and tissue perfusion  MMT and flexibility check for hip, education  on findings from his hips as well as how we can easily incorporate this into POC for his knee   Single leg bridges x10 B R LE hip flexor stretch 2x30 seconds  Piriformis stretch 2x30 seconds  Knee flexion stretch 12x5 second holds L  Forward step ups with eccentric lower back down 6 inch step x12           03/07/24 NuStep L 5 x6 min L knee PROM w/ end range holds  L knee patellar mobs  Leg press 60lb 2x15, LLE 20lb 2x15 8in lateral step ups from airex x10 each  Heel raises 2x10 HS curls 35lb 2x10, LLE 20lb x10 Leg Ext 15lb 2x10, LLE 5lb 2x10       PATIENT EDUCATION:  Education details: Exam findings, POC, initial HEP Person educated: Patient Education method: Explanation, Demonstration, and Handouts Education comprehension: verbalized understanding, returned demonstration, and needs further education  HOME EXERCISE PROGRAM:  Access Code: [email or web address] URL: [email or web address] Date: 03/11/2024 Prepared by: Nedra Hai  Exercises - Standing Knee Flexion Stretch on Step  - 1 x daily - 7 x weekly - 2 sets - 10 reps - Long Sitting Quad Set with Towel Roll Under Heel  - 1 x daily - 7 x weekly - 2 sets - 10 reps - Supine Hamstring Stretch with Strap  - 1 x daily - 7 x weekly - 2 sets - 30 sec hold - Squat with Chair Touch  - 1 x daily - 7 x  weekly - 3 sets - 10 reps - Step Up  - 1 x daily - 7 x weekly - 2 sets - 10 reps - Lateral Step Up  - 1 x daily - 7 x weekly - 2 sets - 10 reps - Forward Step Down Touch with Heel  - 1 x daily - 7 x weekly - 2 sets - 10 reps - Seated Knee Extension Stretch with Chair  - 2-3 x daily - 7 x weekly - 1 sets - 1 reps - 3-5 minutes  hold - Standing Hamstring Stretch with Step  - 2-3 x daily - 7 x weekly - 1 sets - 3 reps - 30 seconds  hold - Single Leg Bridge  - 1 x daily - 7 x weekly - 1-2 sets - 10 reps - 1 second  hold - Supine Figure 4 Piriformis Stretch with Leg Extension  - 2 x daily - 7 x weekly - 1 sets - 2-3 reps - 30 seconds  hold - Modified Thomas Stretch  - 2 x daily - 7 x weekly - 1 sets - 2-3 reps - 30 seconds  hold  ASSESSMENT:  CLINICAL IMPRESSION: Pt arrived with no knee pain but complained of some hip pain. During PROM and stretching, the Rt hip was tighter than the Lt. He demonstrated good LE strength, but is still lacking TKE in Lt knee. He required minimal assist with airex exercises.   OBJECTIVE IMPAIRMENTS: Abnormal gait, decreased activity tolerance, decreased balance, decreased endurance, decreased mobility, difficulty walking, decreased ROM, decreased strength, hypomobility, increased fascial restrictions, impaired flexibility, improper body mechanics, postural dysfunction, and pain.   ACTIVITY LIMITATIONS: carrying, lifting, bending, sitting, standing, squatting, sleeping, stairs, transfers, bed mobility, bathing, toileting, dressing, hygiene/grooming, and locomotion level  PARTICIPATION LIMITATIONS: meal prep, cleaning, driving, shopping, community activity, and yard work  PERSONAL FACTORS: Age, Fitness, Past/current experiences, and Time since onset of injury/illness/exacerbation are also affecting patient's functional outcome.   REHAB POTENTIAL: Good  CLINICAL DECISION MAKING: Evolving/moderate  complexity  EVALUATION COMPLEXITY: Moderate   GOALS: Goals reviewed  with patient? Yes   SHORT TERM GOALS: Target date: 03/11/2024     Independent with initial HEP. Baseline: Newly provided Goal status: Met  02/01/24  2.  Pt will be able to perform 5x STS without keeping L foot in front of R Baseline: Increased weight on R with L foot in front Goal status: Met  02/01/24  3.  Pt will have improved knee ROM to 10-90 deg Baseline: 35-68 deg in sitting Goal status: Met 02/01/24   LONG TERM GOALS: Target date:  03/25/2024     Independent with advanced/ongoing HEP to improve outcomes and carryover.  Baseline: Initial HEP provided Goal status: IN PROGRESS 02/26/24  2.  Atlee Leach will demonstrate knee ROM from 5 to 120 deg to ascend/descend stairs. Baseline:  Goal status: Progressing 02/26/24  3.  AMALIO LOE will be able to ambulate 1000' safely with LRAD and normal gait pattern to access community.  Baseline:  Goal status: Met 02/01/24  4.  WILMORE HOLSOMBACK will have improved LEFS to >/=42.5%  Baseline: 32.5% Goal status: MET 02/26/24  5.  DEQUAVIOUS HARSHBERGER will demonstrate > 19/24 on DGI to demonstrate decreased risk of falls.   Baseline: 14/24 Goal status: MET 02/26/24  6.  Atlee Leach will demonstrate improved functional LE strength by completing 5x STS in <15 seconds.  (MCID 5 seconds) Baseline: 23 sec Goal status: Met 02/19/24 11.35 sec   PLAN:  PT FREQUENCY: 2x/week  PT DURATION: 4 weeks  PLANNED INTERVENTIONS: 97110-Therapeutic exercises, 97530- Therapeutic activity, 97112- Neuromuscular re-education, 97535- Self Care, 16109- Manual therapy, [phone]- Gait training, and 97016- Vasopneumatic device  PLAN FOR NEXT SESSION: ROM focus, hip flexibility and strength, manual as desired/as is helpful- checked hip out 4/7, no need for new POC will work in hip interventions   Terrel Ferries, PT, DPT 03/20/24 12:57 PM   [facility] [address] [place], [place], [address] Phone:  [phone]   Fax:  [phone]  Patient Details  Name: KUNAAL WALKINS MRN: [id number] Date of Birth: 05/14/1941 Referring Provider:  Arnie Lao*  Encounter Date: 03/20/2024   Laurelyn Ponder 03/20/2024, 12:57 PM  [facility] [address] [place], [place], [address] Phone: [phone]   Fax:  [phone] [address] [place], [place], [address] Phone: [phone]   Fax:  [phone]  Patient Details  Name: EGBERT SEIDEL MRN: [id number] Date of Birth: 06/08/1941 Referring Provider:  Arnie Lao*  Encounter Date: 03/20/2024   Laurelyn Ponder 03/20/2024, 12:57 PM  [facility] [address] [place], [place], [address] Phone: [phone]   Fax:  [phone]

## 2024-03-25 ENCOUNTER — Ambulatory Visit: Admitting: Physical Therapy

## 2024-03-25 ENCOUNTER — Encounter: Payer: Self-pay | Admitting: Physical Therapy

## 2024-03-25 DIAGNOSIS — M25562 Pain in left knee: Secondary | ICD-10-CM

## 2024-03-25 DIAGNOSIS — M25662 Stiffness of left knee, not elsewhere classified: Secondary | ICD-10-CM

## 2024-03-25 DIAGNOSIS — R262 Difficulty in walking, not elsewhere classified: Secondary | ICD-10-CM | POA: Diagnosis not present

## 2024-03-25 DIAGNOSIS — M6281 Muscle weakness (generalized): Secondary | ICD-10-CM | POA: Diagnosis not present

## 2024-03-25 DIAGNOSIS — R600 Localized edema: Secondary | ICD-10-CM | POA: Diagnosis not present

## 2024-03-25 NOTE — Therapy (Signed)
 OUTPATIENT PHYSICAL THERAPY LOWER EXTREMITY TREATMENT          Patient Name: Peter Blair MRN: [id number] DOB:[birth year], 83 y.o., male Today's Date: 03/25/2024  END OF SESSION:  PT End of Session - 03/25/24 1255     Visit Number 24    Date for PT Re-Evaluation 04/08/24    Authorization Type [employer]    Progress Note Due on Visit 27    PT Start Time 1300    PT Stop Time 1345    PT Time Calculation (min) 45 min                     Past Medical History:  Diagnosis Date   Coronary artery disease    exertional chest pain, cath 12/10 showed EF 50-55% with mild inferior hypokensis, 99% proximal and 60% distal RCA stenosis, 80% prox stenosis of a moderate sized D1, 40% prox LAD. Patient had BMS x2 placed in RCA. Unstable Angina. PROMUS DES to the prox RCA   Diabetes mellitus without complication (HCC)    Erectile dysfunction    GERD (gastroesophageal reflux disease)    Hyperlipidemia    Hypertension    Nephrolithiasis    Dr. Aram Knights   Renal cyst    Right shoulder pain    rotator cuff   Skin cancer    Skin cancer, PMH of, cell type unknown, Dr. Del Favia   Past Surgical History:  Procedure Laterality Date   CATARACT EXTRACTION W/ INTRAOCULAR LENS IMPLANT Bilateral    colonoscopy with ploypectomy  03/05/2012    Dr Adan Holms, GI   CORONARY ANGIOPLASTY WITH STENT PLACEMENT     X 3 total stents   cystoscopic removal  04/04/2010   CYSTOSCOPY N/A 12/15/2023   Procedure: CYSTOSCOPY FLEXIBLE; URETHERAL DILATION; INSERTION OF FOLEY CATHETER;  Surgeon: Andrez Banker, MD;  Location: [facility];  Service: Urology;  Laterality: N/A;   Flexibe sigmoidoscopy   12/06/1999   LITHOTRIPSY     x 1   RECONSTRUCTION OF EYELID Left    TOTAL KNEE ARTHROPLASTY Left 12/15/2023   Procedure: LEFT TOTAL KNEE ARTHROPLASTY;  Surgeon: Arnie Lao, MD;  Location: [facility];  Service: Orthopedics;  Laterality: Left;   Patient Active Problem List   Diagnosis Date Noted   Status  post total left knee replacement 12/15/2023   Unilateral primary osteoarthritis, right hip 09/25/2023   Actinic keratosis 10/17/2018   Type 2 diabetes mellitus with other circulatory complications (HCC) 12/11/2017   Osteoarthritis, multiple sites 10/11/2017   Preventative health care 08/05/2015   Advance directive discussed with patient 08/05/2015   GERD (gastroesophageal reflux disease)    Hyperplastic colonic polyp 12/17/2013   Benign prostatic hyperplasia with urinary obstruction 01/16/2013   ED (erectile dysfunction) of organic origin 01/16/2013   Coronary atherosclerosis of native coronary artery 11/20/2009   NEPHROLITHIASIS 09/29/2009   RENAL CYST, RIGHT 09/29/2009   Hyperlipemia 09/16/2008   SKIN CANCER, HX OF 09/16/2008   Essential hypertension, benign 07/09/2007    PCP: Helaine Llanos, MD  REFERRING PROVIDER: Arnie Lao, MD  REFERRING DIAG:  [phone] (ICD-10-CM) - Unilateral primary osteoarthritis, left knee   THERAPY DIAG:  Difficulty in walking, not elsewhere classified  Muscle weakness (generalized)  Stiffness of left knee, not elsewhere classified  Acute pain of left knee  Rationale for Evaluation and Treatment: Rehabilitation  ONSET DATE: 12/15/23  SUBJECTIVE:   SUBJECTIVE STATEMENT: "I feel a lot better than yesterday. I don't know why I was so sore"  PERTINENT HISTORY: R hip OA  PAIN:  Are you having pain? Yes: NPRS scale: 1/10 Pain location: B hips/upper legs, knee is ok  Pain description: general soreness  Aggravating factors: doing too much   Relieving factors: pain meds   PRECAUTIONS: Fall  RED FLAGS: None   WEIGHT BEARING RESTRICTIONS: No  FALLS:  Has patient fallen in last 6 months? Yes. Number of falls 1 in September. Fell in a ditch trying to chainsaw a tree that fell in the street  LIVING ENVIRONMENT: Lives with: lives with their spouse, Ninette Basque Lives in: House/apartment Stairs: Yes: External: 3 steps; on right  going up Has following equipment at home: Otho Blitz - 2 wheeled  OCCUPATION: Retired - likes to fish  PLOF: Independent  PATIENT GOALS: Get back to fishing and normal activities  NEXT MD VISIT: 02/28/24  OBJECTIVE:  Note: Objective measures were completed at Evaluation unless otherwise noted.  DIAGNOSTIC FINDINGS: xray 12/15/23 IMPRESSION: Interval total left knee arthroplasty without evidence of hardware failure.  PATIENT SURVEYS:  Lower Extremity Functional Score: 26 / 80 = 32.5 %; 02/26/24  50/80, 62.5%  COGNITION: Overall cognitive status: Within functional limits for tasks assessed     SENSATION: WFL  EDEMA:  L knee 50% bigger than R LE  MUSCLE LENGTH: Hamstrings: limited due to knee ROM on L Thomas test: Did not assess  HS severe limitation R, mod limitation L/limited by TKR ROM  Piriformis mild limitation L/WNL R  Hip flexor mod limitation per functional observation Quads: R mod limitation/L limited by TKR ROM   LOWER EXTREMITY ROM:  Active ROM Right eval Left eval Left 01/04/24 Left 01/15/24 Left 02/01/24 Left 02/05/24 02/12/24 Left AROM 02/22/24 02/26/24 AROM  03/07/24 L AROM 03/13/24 L AROM  Hip flexion             Hip extension             Hip abduction             Hip adduction             Hip internal rotation             Hip external rotation             Knee flexion 125 68 90 87* AAROM, only able to get to 70* AROM due to pain  95 AAROM 106*, AROM 98* 102  108* 106 105*  Knee extension -5 -35 -12 -6* AROM supine  10 6* seated quad set AROM with leg propped on PT's knee  9 5* PROM  11* 8 4* LAQ   Ankle dorsiflexion             Ankle plantarflexion             Ankle inversion             Ankle eversion              (Blank rows = not tested)  LOWER EXTREMITY MMT:  MMT Right eval Left eval Left 02/26/24 Right 03/11/24 Left 03/11/24  Hip flexion 4+ 3+ 4+ 4+ 4+  Hip extension    3 3  Hip abduction 4 4-  4+ 4+  Hip adduction 5 5     Hip internal rotation        Hip external rotation       Knee flexion 5 3+ 4+    Knee extension 5 3- (limited due to ROM) 4+    Ankle dorsiflexion  Ankle plantarflexion       Ankle inversion       Ankle eversion        (Blank rows = not tested)  FUNCTIONAL TESTS:  5 times sit to stand: L LE forward with bilat UE support; 23 sec; 02/26/24 13 seconds no UEs  Dynamic Gait Index: 14/24; 02/26/24 21/24      02/26/24 0001  Dynamic Gait Index  Level Surface 3  Change in Gait Speed 2  Gait with Horizontal Head Turns 2  Gait with Vertical Head Turns 3  Gait and Pivot Turn 3  Step Over Obstacle 3  Step Around Obstacles 3  Steps 2  Total Score 21     GAIT: Distance walked: In clinic during DGI Assistive device utilized: Walker - 2 wheeled Level of assistance: Modified independence Comments: even step lengths bilat but diminished knee flexion during swing phase and limited knee ext during stance phase                                                                                                                                TREATMENT DATE: 03/25/24 Nustep L4 PROM with end range holds    Rt. Knee flex and ext  B hip flex, ext, IR,ER,  Bridges x10  With ball x10 HS curl on ball x10- constant cues to maintain good form Leg press 60# 2x12, LLE 30# 2x10 Red band resisted hip ext 2x10 each     Hip abd 2x10 each Black bar heel raise 2x12 8in step up x12 each leg 6in lateral step up x12 each way 30# resisted gait backwards x10 20# resisted lateral steps x10 each way   03/20/24 Bike L3 PROM  Rt. Knee flex and ext B hip flex, ext, IR,ER -Rt hip is tighter than Lt L patellar mobs HS curls 30lb 2x10, LLE 20lb x8 Leg Ext 15lb 2x10, LLE 5lb x8 Leg press 60# 2x10 Heel raises 2x10 20# March in place 2x20 3# HHA x2 for the first set  Alt standing Hip abd 2x20 3# Standing Hip ext 2x20 3# 6in step up from airex x10 6in step up on  airex x10       03/18/24 Scifit bike seat 13 x8  minutes L3.5 for ROM and tissue perfusion PROM hip and knee Sit to stand x5 4in step up x10 each 6in step up with hip extension x10 6in lateral step up with hip abduction x10 each Bridges x10 , x10 with ball btwn knees Piriformis stretch March in place 2x20 2# Resisted gait forward and backward x10 each       03/13/24  Scifit bike seat 11 x8 minutes L3 for ROM and tissue perfusion Door squats self selected depth x12 Standing hip hikes x12 B Standing hip hikes + ABD x12 B Forward step ups 8 inch box LLE x12  Lateral step ups 8 inch box LLE x12 Forward step downs 6 inch x12 L  LE   Lateral hip excursions x20 for lateral thigh/hip stretch HS stretches 2x30  seconds B Pirifomis stretches 2x30 seconds B   Knee extension overpressure  Knee flexion overpressure   MHP to R hips in sidelying at EOS, not included in billing (performed in extra time after skilled part of session had been completed)          03/11/24  Scifit bike seat 11 x8 minutes for ROM and tissue perfusion  MMT and flexibility check for hip, education on findings from his hips as well as how we can easily incorporate this into POC for his knee   Single leg bridges x10 B R LE hip flexor stretch 2x30 seconds  Piriformis stretch 2x30 seconds  Knee flexion stretch 12x5 second holds L  Forward step ups with eccentric lower back down 6 inch step x12           03/07/24 NuStep L 5 x6 min L knee PROM w/ end range holds  L knee patellar mobs  Leg press 60lb 2x15, LLE 20lb 2x15 8in lateral step ups from airex x10 each  Heel raises 2x10 HS curls 35lb 2x10, LLE 20lb x10 Leg Ext 15lb 2x10, LLE 5lb 2x10       PATIENT EDUCATION:  Education details: Exam findings, POC, initial HEP Person educated: Patient Education method: Explanation, Demonstration, and Handouts Education comprehension: verbalized understanding, returned demonstration, and needs further education  HOME EXERCISE  PROGRAM:  Access Code: [email or web address] URL: [email or web address] Date: 03/11/2024 Prepared by: Terrel Ferries  Exercises - Standing Knee Flexion Stretch on Step  - 1 x daily - 7 x weekly - 2 sets - 10 reps - Long Sitting Quad Set with Towel Roll Under Heel  - 1 x daily - 7 x weekly - 2 sets - 10 reps - Supine Hamstring Stretch with Strap  - 1 x daily - 7 x weekly - 2 sets - 30 sec hold - Squat with Chair Touch  - 1 x daily - 7 x weekly - 3 sets - 10 reps - Step Up  - 1 x daily - 7 x weekly - 2 sets - 10 reps - Lateral Step Up  - 1 x daily - 7 x weekly - 2 sets - 10 reps - Forward Step Down Touch with Heel  - 1 x daily - 7 x weekly - 2 sets - 10 reps - Seated Knee Extension Stretch with Chair  - 2-3 x daily - 7 x weekly - 1 sets - 1 reps - 3-5 minutes  hold - Standing Hamstring Stretch with Step  - 2-3 x daily - 7 x weekly - 1 sets - 3 reps - 30 seconds  hold - Single Leg Bridge  - 1 x daily - 7 x weekly - 1-2 sets - 10 reps - 1 second  hold - Supine Figure 4 Piriformis Stretch with Leg Extension  - 2 x daily - 7 x weekly - 1 sets - 2-3 reps - 30 seconds  hold - Modified Thomas Stretch  - 2 x daily - 7 x weekly - 1 sets - 2-3 reps - 30 seconds  hold  ASSESSMENT:  CLINICAL IMPRESSION: Pt arrived with no knee pain but complained of some hip pain. Visually, he is improving his L knee ROM, but is still lacking some ext. He had some hip pain, but his Rt hip felt more flexible than last visit. He did some bridging exercise variations, but struggles with HS curls with the ball.  He need constant cuing to maintain proper form. He was independent with all resisted hip exercise. He self corrected compensatory lateral trunk bending. He stumbled on the step up, but was able to regain his balance without assistance.   OBJECTIVE IMPAIRMENTS: Abnormal gait, decreased activity tolerance, decreased balance, decreased endurance, decreased mobility, difficulty walking, decreased ROM, decreased strength,  hypomobility, increased fascial restrictions, impaired flexibility, improper body mechanics, postural dysfunction, and pain.   ACTIVITY LIMITATIONS: carrying, lifting, bending, sitting, standing, squatting, sleeping, stairs, transfers, bed mobility, bathing, toileting, dressing, hygiene/grooming, and locomotion level  PARTICIPATION LIMITATIONS: meal prep, cleaning, driving, shopping, community activity, and yard work  PERSONAL FACTORS: Age, Fitness, Past/current experiences, and Time since onset of injury/illness/exacerbation are also affecting patient's functional outcome.   REHAB POTENTIAL: Good  CLINICAL DECISION MAKING: Evolving/moderate complexity  EVALUATION COMPLEXITY: Moderate   GOALS: Goals reviewed with patient? Yes   SHORT TERM GOALS: Target date: 03/11/2024     Independent with initial HEP. Baseline: Newly provided Goal status: Met  02/01/24  2.  Pt will be able to perform 5x STS without keeping L foot in front of R Baseline: Increased weight on R with L foot in front Goal status: Met  02/01/24  3.  Pt will have improved knee ROM to 10-90 deg Baseline: 35-68 deg in sitting Goal status: Met 02/01/24   LONG TERM GOALS: Target date:  03/25/2024     Independent with advanced/ongoing HEP to improve outcomes and carryover.  Baseline: Initial HEP provided Goal status: IN PROGRESS 02/26/24  2.  Atlee Leach will demonstrate knee ROM from 5 to 120 deg to ascend/descend stairs. Baseline:  Goal status: Progressing 02/26/24  3.  RAHIL PASSEY will be able to ambulate 1000' safely with LRAD and normal gait pattern to access community.  Baseline:  Goal status: Met 02/01/24  4.  STEPHANOS FAN will have improved LEFS to >/=42.5%  Baseline: 32.5% Goal status: MET 02/26/24  5.  DERYL GIROUX will demonstrate > 19/24 on DGI to demonstrate decreased risk of falls.   Baseline: 14/24 Goal status: MET 02/26/24  6.  Atlee Leach will demonstrate improved functional LE  strength by completing 5x STS in <15 seconds.  (MCID 5 seconds) Baseline: 23 sec Goal status: Met 02/19/24 11.35 sec   PLAN:  PT FREQUENCY: 2x/week  PT DURATION: 4 weeks  PLANNED INTERVENTIONS: 97110-Therapeutic exercises, 97530- Therapeutic activity, 97112- Neuromuscular re-education, 97535- Self Care, 40981- Manual therapy, [id number]- Gait training, and 97016- Vasopneumatic device  PLAN FOR NEXT SESSION: ROM focus (knee ext) , hip flexibility and strength, manual as desired/as is helpful- checked hip out 4/7, no need for new POC will work in hip interventions    Laurelyn Ponder, SPTA 03/25/2024, 12:58 PM

## 2024-03-27 ENCOUNTER — Ambulatory Visit: Admitting: Physical Therapy

## 2024-03-27 ENCOUNTER — Encounter: Payer: Self-pay | Admitting: Physical Therapy

## 2024-03-27 DIAGNOSIS — R262 Difficulty in walking, not elsewhere classified: Secondary | ICD-10-CM | POA: Diagnosis not present

## 2024-03-27 DIAGNOSIS — M25562 Pain in left knee: Secondary | ICD-10-CM | POA: Diagnosis not present

## 2024-03-27 DIAGNOSIS — R600 Localized edema: Secondary | ICD-10-CM

## 2024-03-27 DIAGNOSIS — M25662 Stiffness of left knee, not elsewhere classified: Secondary | ICD-10-CM | POA: Diagnosis not present

## 2024-03-27 DIAGNOSIS — M6281 Muscle weakness (generalized): Secondary | ICD-10-CM | POA: Diagnosis not present

## 2024-03-27 NOTE — Therapy (Signed)
 OUTPATIENT PHYSICAL THERAPY LOWER EXTREMITY TREATMENT/DISCHARGE     PHYSICAL THERAPY DISCHARGE SUMMARY  Visits from Start of Care: 25  Current functional level related to goals / functional outcomes: See below    Remaining deficits: See below    Education / Equipment: See below    Patient agrees to discharge. Patient goals were met. Patient is being discharged due to meeting the stated rehab goals.        Patient Name: Peter Blair MRN: [id number] DOB:Dec 29, 1940, 83 y.o., male Today's Date: 03/27/2024  END OF SESSION:  PT End of Session - 03/27/24 1313     Visit Number 25    Date for PT Re-Evaluation 04/08/24    Authorization Type [employer] Medicare    Progress Note Due on Visit 27    PT Start Time 1303    PT Stop Time 1342    PT Time Calculation (min) 39 min    Activity Tolerance Patient tolerated treatment well    Behavior During Therapy WFL for tasks assessed/performed                      Past Medical History:  Diagnosis Date   Coronary artery disease    exertional chest pain, cath 12/10 showed EF 50-55% with mild inferior hypokensis, 99% proximal and 60% distal RCA stenosis, 80% prox stenosis of a moderate sized D1, 40% prox LAD. Patient had BMS x2 placed in RCA. Unstable Angina. PROMUS DES to the prox RCA   Diabetes mellitus without complication (HCC)    Erectile dysfunction    GERD (gastroesophageal reflux disease)    Hyperlipidemia    Hypertension    Nephrolithiasis    Dr. Aram Knights   Renal cyst    Right shoulder pain    rotator cuff   Skin cancer    Skin cancer, PMH of, cell type unknown, Dr. Del Favia   Past Surgical History:  Procedure Laterality Date   CATARACT EXTRACTION W/ INTRAOCULAR LENS IMPLANT Bilateral    colonoscopy with ploypectomy  03/05/2012    Dr Adan Holms, GI   CORONARY ANGIOPLASTY WITH STENT PLACEMENT     X 3 total stents   cystoscopic removal  04/04/2010   CYSTOSCOPY N/A 12/15/2023   Procedure: CYSTOSCOPY FLEXIBLE;  URETHERAL DILATION; INSERTION OF FOLEY CATHETER;  Surgeon: Andrez Banker, MD;  Location: [facility];  Service: Urology;  Laterality: N/A;   Flexibe sigmoidoscopy   12/06/1999   LITHOTRIPSY     x 1   RECONSTRUCTION OF EYELID Left    TOTAL KNEE ARTHROPLASTY Left 12/15/2023   Procedure: LEFT TOTAL KNEE ARTHROPLASTY;  Surgeon: Arnie Lao, MD;  Location: [facility];  Service: Orthopedics;  Laterality: Left;   Patient Active Problem List   Diagnosis Date Noted   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/15/2023   Unilateral primary osteoarthritis, right hip 09/25/2023   Actinic keratosis 10/17/2018   Type 2 diabetes mellitus with other circulatory complications (HCC) 12/11/2017   Osteoarthritis, multiple sites 10/11/2017   Preventative health care 08/05/2015   Advance directive discussed with patient 08/05/2015   GERD (gastroesophageal reflux disease)    Hyperplastic colonic polyp 12/17/2013   Benign prostatic hyperplasia with urinary obstruction 01/16/2013   ED (erectile dysfunction) of organic origin 01/16/2013   Coronary atherosclerosis of native coronary artery 11/20/2009   NEPHROLITHIASIS 09/29/2009   RENAL CYST, RIGHT 09/29/2009   Hyperlipemia 09/16/2008   SKIN CANCER, HX OF 09/16/2008   Essential hypertension, benign 07/09/2007    PCP: Curt Dover  I, MD  REFERRING PROVIDER: Arnie Lao, MD  REFERRING DIAG:  [id number] (ICD-10-CM) - Unilateral primary osteoarthritis, left knee   THERAPY DIAG:  No diagnosis found.  Rationale for Evaluation and Treatment: Rehabilitation  ONSET DATE: 12/15/23  SUBJECTIVE:   SUBJECTIVE STATEMENT:  Things are about the same, hip is not changing much. Its been hurting more as I keep moving around. Feeling like I can do everything that I need to at this point, hip just hurts. Feel like I'm doing pretty good.     PERTINENT HISTORY: R hip OA  PAIN:  Are you having pain? Yes: NPRS scale: 1/10 Pain location: R  hips/upper legs, knee is ok  Pain description: general soreness Aggravating factors: doing too much   Relieving factors: pain meds   PRECAUTIONS: Fall  RED FLAGS: None   WEIGHT BEARING RESTRICTIONS: No  FALLS:  Has patient fallen in last 6 months? Yes. Number of falls 1 in September. Fell in a ditch trying to chainsaw a tree that fell in the street  LIVING ENVIRONMENT: Lives with: lives with their spouse, Ninette Basque Lives in: House/apartment Stairs: Yes: External: 3 steps; on right going up Has following equipment at home: Otho Blitz - 2 wheeled  OCCUPATION: Retired - likes to fish  PLOF: Independent  PATIENT GOALS: Get back to fishing and normal activities  NEXT MD VISIT: 02/28/24  OBJECTIVE:  Note: Objective measures were completed at Evaluation unless otherwise noted.  DIAGNOSTIC FINDINGS: xray 12/15/23 IMPRESSION: Interval total left knee arthroplasty without evidence of hardware failure.  PATIENT SURVEYS:  Lower Extremity Functional Score: 26 / 80 = 32.5 %; 02/26/24  50/80, 62.5%  COGNITION: Overall cognitive status: Within functional limits for tasks assessed     SENSATION: WFL  EDEMA:  L knee 50% bigger than R LE  MUSCLE LENGTH: Hamstrings: limited due to knee ROM on L Thomas test: Did not assess  HS severe limitation R, mod limitation L/limited by TKR ROM  Piriformis mild limitation L/WNL R  Hip flexor mod limitation per functional observation Quads: R mod limitation/L limited by TKR ROM   LOWER EXTREMITY ROM:  Active ROM Right eval Left eval Left 01/04/24 Left 01/15/24 Left 02/01/24 Left 02/05/24 02/12/24 Left AROM 02/22/24 02/26/24 AROM  03/07/24 L AROM 03/13/24 L AROM 03/27/24 AROM   Hip flexion              Hip extension              Hip abduction              Hip adduction              Hip internal rotation              Hip external rotation              Knee flexion 125 68 90 87* AAROM, only able to get to 70* AROM due to pain  95 AAROM 106*, AROM  98* 102  108* 106 105* 110*  Knee extension -5 -35 -12 -6* AROM supine  10 6* seated quad set AROM with leg propped on PT's knee  9 5* PROM  11* 8 4* LAQ  10* LAQ, 2* HS stretch   Ankle dorsiflexion              Ankle plantarflexion              Ankle inversion  Ankle eversion               (Blank rows = not tested)  LOWER EXTREMITY MMT:  MMT Right eval Left eval Left 02/26/24 Right 03/11/24 Left 03/11/24  Hip flexion 4+ 3+ 4+ 4+ 4+  Hip extension    3 3  Hip abduction 4 4-  4+ 4+  Hip adduction 5 5     Hip internal rotation       Hip external rotation       Knee flexion 5 3+ 4+    Knee extension 5 3- (limited due to ROM) 4+    Ankle dorsiflexion       Ankle plantarflexion       Ankle inversion       Ankle eversion        (Blank rows = not tested)  FUNCTIONAL TESTS:  5 times sit to stand: L LE forward with bilat UE support; 23 sec; 02/26/24 13 seconds no UEs  Dynamic Gait Index: 14/24; 02/26/24 21/24      02/26/24 0001  Dynamic Gait Index  Level Surface 3  Change in Gait Speed 2  Gait with Horizontal Head Turns 2  Gait with Vertical Head Turns 3  Gait and Pivot Turn 3  Step Over Obstacle 3  Step Around Obstacles 3  Steps 2  Total Score 21     GAIT: Distance walked: In clinic during DGI Assistive device utilized: Walker - 2 wheeled Level of assistance: Modified independence Comments: even step lengths bilat but diminished knee flexion during swing phase and limited knee ext during stance phase                                                                                                                                TREATMENT DATE:  03/27/24  Nustep L5x8 minutes BLEs only ROM check HS stretches on steps LLE 3x30 seconds  Door squats x15 Forward step downs 4 inch step x15 L LE  STS with 10# in L UE x15 L foot staggered backward  Goblet hold with STS 10# x12  Hip hikes x15 B     Checked for LLD- mid patella to top of medial malleolus R 40cm  L 41cm; educated on shoe lift from [place] and how to use/adjust if he has extra pain after placement. Education on DC today, encouraged continuing HEP, recommended f/u with MD about ongoing hip pain that has not changed with PT           03/25/24 Nustep L4 PROM with end range holds    Rt. Knee flex and ext  B hip flex, ext, IR,ER,  Bridges x10  With ball x10 HS curl on ball x10- constant cues to maintain good form Leg press 60# 2x12, LLE 30# 2x10 Red band resisted hip ext 2x10 each     Hip abd 2x10 each Black bar heel raise 2x12 8in step up x12 each leg  6in lateral step up x12 each way 30# resisted gait backwards x10 20# resisted lateral steps x10 each way   03/20/24 Bike L3 PROM  Rt. Knee flex and ext B hip flex, ext, IR,ER -Rt hip is tighter than Lt L patellar mobs HS curls 30lb 2x10, LLE 20lb x8 Leg Ext 15lb 2x10, LLE 5lb x8 Leg press 60# 2x10 Heel raises 2x10 20# March in place 2x20 3# HHA x2 for the first set  Alt standing Hip abd 2x20 3# Standing Hip ext 2x20 3# 6in step up from airex x10 6in step up on  airex x10       03/18/24 Scifit bike seat 13 x8 minutes L3.5 for ROM and tissue perfusion PROM hip and knee Sit to stand x5 4in step up x10 each 6in step up with hip extension x10 6in lateral step up with hip abduction x10 each Bridges x10 , x10 with ball btwn knees Piriformis stretch March in place 2x20 2# Resisted gait forward and backward x10 each       03/13/24  Scifit bike seat 11 x8 minutes L3 for ROM and tissue perfusion Door squats self selected depth x12 Standing hip hikes x12 B Standing hip hikes + ABD x12 B Forward step ups 8 inch box LLE x12  Lateral step ups 8 inch box LLE x12 Forward step downs 6 inch x12 L LE   Lateral hip excursions x20 for lateral thigh/hip stretch HS stretches 2x30  seconds B Pirifomis stretches 2x30 seconds B   Knee extension overpressure  Knee flexion overpressure   MHP to R hips in  sidelying at EOS, not included in billing (performed in extra time after skilled part of session had been completed)          03/11/24  Scifit bike seat 11 x8 minutes for ROM and tissue perfusion  MMT and flexibility check for hip, education on findings from his hips as well as how we can easily incorporate this into POC for his knee   Single leg bridges x10 B R LE hip flexor stretch 2x30 seconds  Piriformis stretch 2x30 seconds  Knee flexion stretch 12x5 second holds L  Forward step ups with eccentric lower back down 6 inch step x12           03/07/24 NuStep L 5 x6 min L knee PROM w/ end range holds  L knee patellar mobs  Leg press 60lb 2x15, LLE 20lb 2x15 8in lateral step ups from airex x10 each  Heel raises 2x10 HS curls 35lb 2x10, LLE 20lb x10 Leg Ext 15lb 2x10, LLE 5lb 2x10       PATIENT EDUCATION:  Education details: Exam findings, POC, initial HEP Person educated: Patient Education method: Explanation, Demonstration, and Handouts Education comprehension: verbalized understanding, returned demonstration, and needs further education  HOME EXERCISE PROGRAM:  Access Code: [email or web address] URL: [email or web address] Date: 03/11/2024 Prepared by: Terrel Ferries  Exercises - Standing Knee Flexion Stretch on Step  - 1 x daily - 7 x weekly - 2 sets - 10 reps - Long Sitting Quad Set with Towel Roll Under Heel  - 1 x daily - 7 x weekly - 2 sets - 10 reps - Supine Hamstring Stretch with Strap  - 1 x daily - 7 x weekly - 2 sets - 30 sec hold - Squat with Chair Touch  - 1 x daily - 7 x weekly - 3 sets - 10 reps - Step Up  - 1 x daily - 7 x weekly -  2 sets - 10 reps - Lateral Step Up  - 1 x daily - 7 x weekly - 2 sets - 10 reps - Forward Step Down Touch with Heel  - 1 x daily - 7 x weekly - 2 sets - 10 reps - Seated Knee Extension Stretch with Chair  - 2-3 x daily - 7 x weekly - 1 sets - 1 reps - 3-5 minutes  hold - Standing Hamstring Stretch with Step  -  2-3 x daily - 7 x weekly - 1 sets - 3 reps - 30 seconds  hold - Single Leg Bridge  - 1 x daily - 7 x weekly - 1-2 sets - 10 reps - 1 second  hold - Supine Figure 4 Piriformis Stretch with Leg Extension  - 2 x daily - 7 x weekly - 1 sets - 2-3 reps - 30 seconds  hold - Modified Thomas Stretch  - 2 x daily - 7 x weekly - 1 sets - 2-3 reps - 30 seconds  hold  ASSESSMENT:  CLINICAL IMPRESSION:  Pt arrives today doing OK, he is not noticing a lot of differences in his hip. His knee is doing well, he can do all he needs and wants to on his own. ROM and quad strength is still lacking a bit but he has been compliant with his HEP and this may very well improve on its own with some time. 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 He does have some pain patterns in his hip pain that may indicate need for shot as MD had previously discussed with him, however he does also report groin pain and a "toothache" in the hip at night, recommend further MD w/u. DC today as he has reached max benefit from skilled PT services and is very functional, thank you for the referral!     OBJECTIVE IMPAIRMENTS: Abnormal gait, decreased activity tolerance, decreased balance, decreased endurance, decreased mobility, difficulty walking, decreased ROM, decreased strength, hypomobility, increased fascial restrictions, impaired flexibility, improper body mechanics, postural dysfunction, and pain.   ACTIVITY LIMITATIONS: carrying, lifting, bending, sitting, standing, squatting, sleeping, stairs, transfers, bed mobility, bathing, toileting, dressing, hygiene/grooming, and locomotion level  PARTICIPATION LIMITATIONS: meal prep, cleaning, driving, shopping, community activity, and yard work  PERSONAL FACTORS: Age, Fitness, Past/current experiences, and Time since onset of injury/illness/exacerbation are also affecting patient's functional outcome.   REHAB POTENTIAL: Good  CLINICAL DECISION  MAKING: Evolving/moderate complexity  EVALUATION COMPLEXITY: Moderate   GOALS: Goals reviewed with patient? Yes   SHORT TERM GOALS: Target date: 03/11/2024     Independent with initial HEP. Baseline: Newly provided Goal status: Met  02/01/24  2.  Pt will be able to perform 5x STS without keeping L foot in front of R Baseline: Increased weight on R with L foot in front Goal status: Met  02/01/24  3.  Pt will have improved knee ROM to 10-90 deg Baseline: 35-68 deg in sitting Goal status: Met 02/01/24   LONG TERM GOALS: Target date:  03/25/2024     Independent with advanced/ongoing HEP to improve outcomes and carryover.  Baseline: Initial HEP provided Goal status: MET 03/27/24  2.  Atlee Leach will demonstrate knee ROM from 5 to 120 deg to ascend/descend stairs. Baseline:  Goal status: PARTIALLY MET 03/27/24  3.  MAZE CORNIEL will be able to ambulate 1000' safely with LRAD and normal gait pattern to access community.  Baseline:  Goal status: Met 02/01/24  4.  LARELL BANEY will have improved LEFS to >/=42.5%  Baseline: 32.5% Goal status: MET 02/26/24  5.  LEONDRO CORYELL will demonstrate > 19/24 on DGI to demonstrate decreased risk of falls.   Baseline: 14/24 Goal status: MET 02/26/24  6.  Atlee Leach will demonstrate improved functional LE strength by completing 5x STS in <15 seconds.  (MCID 5 seconds) Baseline: 23 sec Goal status: Met 02/19/24 11.35 sec   PLAN:  PT FREQUENCY: 2x/week  PT DURATION: 4 weeks  PLANNED INTERVENTIONS: 97110-Therapeutic exercises, 97530- Therapeutic activity, [id number]- Neuromuscular re-education, 97535- Self Care, 65784- Manual therapy, [phone]- Gait training, and 97016- Vasopneumatic device  PLAN FOR NEXT SESSION: DC today   Terrel Ferries, PT, DPT 03/27/24 1:58 PM

## 2024-04-10 ENCOUNTER — Other Ambulatory Visit (INDEPENDENT_AMBULATORY_CARE_PROVIDER_SITE_OTHER)

## 2024-04-10 ENCOUNTER — Other Ambulatory Visit: Payer: Self-pay

## 2024-04-10 ENCOUNTER — Encounter: Payer: Self-pay | Admitting: Orthopaedic Surgery

## 2024-04-10 ENCOUNTER — Ambulatory Visit (INDEPENDENT_AMBULATORY_CARE_PROVIDER_SITE_OTHER): Admitting: Orthopaedic Surgery

## 2024-04-10 DIAGNOSIS — Z96652 Presence of left artificial knee joint: Secondary | ICD-10-CM

## 2024-04-10 DIAGNOSIS — M25551 Pain in right hip: Secondary | ICD-10-CM

## 2024-04-10 NOTE — Progress Notes (Signed)
 The patient is a 83 year old gentleman who is now 4 months status post a left total knee replacement.  He said the left knee is doing very well.  He is still dealing with right-sided hip pain and groin pain.  An intra-articular injection in that right hip prior to surgery did not help much at all.  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  He occasionally does get pain that radiates down into his foot but most of pain seems to be around the right hip these days.  Examination of his left operative knee shows he lacks full extension by only just a degree or 2 and his flexion is past to 100 degrees.  The knee feels stable and overall looks good.  There is still some swelling which is to be expected.  He knows this can take several months for all of this to dissipate.  Examination of his right hip shows stiffness with internal and external rotation and pain in the groin.  At this point he has failed conservative treatment on his right hip including intra-articular steroid injection and physical therapy.  We need to obtain an MRI of the right hip to assess the cartilage so we can determine what her next treatment steps are.  We may have need to eventually also MRIs lumbar spine given his spondylolisthesis at L4-L5 and the radicular symptoms going down his right leg but it seems like the right hip may be more of an issue.  Will see him back in follow-up once we have the MRI of the right hip.

## 2024-04-20 ENCOUNTER — Ambulatory Visit
Admission: RE | Admit: 2024-04-20 | Discharge: 2024-04-20 | Disposition: A | Discharge status: Home / Self Care | Source: Ambulatory Visit | Attending: Orthopaedic Surgery | Admitting: Orthopaedic Surgery

## 2024-04-20 DIAGNOSIS — M25451 Effusion, right hip: Secondary | ICD-10-CM | POA: Diagnosis not present

## 2024-04-20 DIAGNOSIS — G8929 Other chronic pain: Secondary | ICD-10-CM | POA: Diagnosis not present

## 2024-04-20 DIAGNOSIS — M25551 Pain in right hip: Secondary | ICD-10-CM | POA: Diagnosis not present

## 2024-05-01 ENCOUNTER — Ambulatory Visit: Admitting: Physician Assistant

## 2024-05-20 ENCOUNTER — Ambulatory Visit: Admitting: Physician Assistant

## 2024-05-20 ENCOUNTER — Other Ambulatory Visit (INDEPENDENT_AMBULATORY_CARE_PROVIDER_SITE_OTHER)

## 2024-05-20 ENCOUNTER — Encounter: Payer: Self-pay | Admitting: Physician Assistant

## 2024-05-20 DIAGNOSIS — Z96652 Presence of left artificial knee joint: Secondary | ICD-10-CM

## 2024-05-20 DIAGNOSIS — M79672 Pain in left foot: Secondary | ICD-10-CM

## 2024-05-20 DIAGNOSIS — M1611 Unilateral primary osteoarthritis, right hip: Secondary | ICD-10-CM

## 2024-05-20 NOTE — Progress Notes (Signed)
 Office Visit Note   Patient: Peter Blair           Date of Birth: 24-Oct-1941           MRN: [id number] Visit Date: 05/20/2024              Requested by: Peter Llanos, MD [address] [place],  [place] [address] PCP: Peter Llanos, MD   Assessment & Plan: Visit Diagnoses:  1. Pain of left heel   2. Unilateral primary osteoarthritis, right hip   3. Status post total left knee replacement     Plan: Discussed with him proper shoe wear.  He is to avoid open back shoes.  We will set him up for shockwave therapy to his left Achilles insertion region with Peter Blair.  In regards to his hip at this point in time he is not ready to proceed with any type of surgical intervention.  Will let us  know when and if he wants to proceed.  Therefore we will see him back for his knee in January's with a 1 year anniversary of his left total knee arthroplasty.  At that time we will obtain AP and lateral views of his left knee.  Questions were encouraged and answered.  Follow-Up Instructions: Return in about 7 months (around 12/20/2024) for Radiographs.   Orders:  Orders Placed This Encounter  Procedures   XR Os Calcis Left   No orders of the defined types were placed in this encounter.     Procedures: No procedures performed   Clinical Data: No additional findings.   Subjective: Chief Complaint  Patient presents with   Right Hip - Follow-up    MRI REVIEW    HPI Peter Blair returns today for follow-up MRI of his right hip.  MRI images are reviewed with the patient.  MRI shows by my read moderate right hip degenerative changes with pincer type impingement osteophyte and moderate joint space loss.  Small joint effusion.  No evidence of AVN. Patient states that at this point in time he is having more problems with his left Achilles.  He has had no injury.  He has had pain in the Achilles now for some time.  Prior history of Achilles tendinitis some years ago and had what  sounds like was shockwave therapy that healed this. Review of Systems See HPI otherwise negative  Objective: Vital Signs: There were no vitals taken for this visit.  Physical Exam Constitutional:      Appearance: He is not ill-appearing or diaphoretic.     Ortho Exam Left lower leg calf supple nontender Thompson test negative.  Achilles tenderness at the insertion primarily to lateral insertion.  Slight pump bump present.  Nontender over the posterior tibial tendon and peroneal tendons.  Nontender over the medial tubercle of the calcaneus. Specialty Comments:  No specialty comments available.  Imaging: XR Os Calcis Left Result Date: 05/20/2024 Left heel 2 views: No acute fracture.  Haglund's deformity.  Arthrosclerosis noted.    PMFS History: Patient Active Problem List   Diagnosis Date Noted   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/15/2023   Unilateral primary osteoarthritis, right hip 09/25/2023   Actinic keratosis 10/17/2018   Type 2 diabetes mellitus with other circulatory complications (HCC) 12/11/2017   Osteoarthritis, multiple sites 10/11/2017   Preventative health care 08/05/2015   Advance directive discussed with patient 08/05/2015   GERD (gastroesophageal reflux disease)    Hyperplastic colonic polyp 12/17/2013   Benign  prostatic hyperplasia with urinary obstruction 01/16/2013   ED (erectile dysfunction) of organic origin 01/16/2013   Coronary atherosclerosis of native coronary artery 11/20/2009   NEPHROLITHIASIS 09/29/2009   RENAL CYST, RIGHT 09/29/2009   Hyperlipemia 09/16/2008   SKIN CANCER, HX OF 09/16/2008   Essential hypertension, benign 07/09/2007   Past Medical History:  Diagnosis Date   Coronary artery disease    exertional chest pain, cath 12/10 showed EF 50-55% with mild inferior hypokensis, 99% proximal and 60% distal RCA stenosis, 80% prox stenosis of a moderate sized D1, 40% prox LAD. Patient had BMS x2 placed in RCA. Unstable Angina. PROMUS  DES to the prox RCA   Diabetes mellitus without complication (HCC)    Erectile dysfunction    GERD (gastroesophageal reflux disease)    Hyperlipidemia    Hypertension    Nephrolithiasis    Dr. Aram Blair   Renal cyst    Right shoulder pain    rotator cuff   Skin cancer    Skin cancer, PMH of, cell type unknown, Dr. Del Blair    Family History  Problem Relation Age of Onset   Breast cancer Mother    Cervical cancer Mother    Diabetes Father    Heart attack Father 85   Breast cancer Sister    Colon cancer Sister        mets to liver & lung   Stroke Neg Hx     Past Surgical History:  Procedure Laterality Date   CATARACT EXTRACTION W/ INTRAOCULAR LENS IMPLANT Bilateral    colonoscopy with ploypectomy  03/05/2012    Dr Peter Blair, GI   CORONARY ANGIOPLASTY WITH STENT PLACEMENT     X 3 total stents   cystoscopic removal  04/04/2010   CYSTOSCOPY N/A 12/15/2023   Procedure: CYSTOSCOPY FLEXIBLE; URETHERAL DILATION; INSERTION OF FOLEY CATHETER;  Surgeon: Peter Banker, MD;  Location: [facility];  Service: Urology;  Laterality: N/A;   Flexibe sigmoidoscopy   12/06/1999   LITHOTRIPSY     x 1   RECONSTRUCTION OF EYELID Left    TOTAL KNEE ARTHROPLASTY Left 12/15/2023   Procedure: LEFT TOTAL KNEE ARTHROPLASTY;  Surgeon: Peter Lao, MD;  Location: [facility];  Service: Orthopedics;  Laterality: Left;   Social History   Occupational History   Occupation: Sports coach    Comment: Retired  Tobacco Use   Smoking status: Former    Current packs/day: 0.00    Types: Cigarettes    Quit date: 12/05/1970    Years since quitting: 53.4    Passive exposure: Never   Smokeless tobacco: Never   Tobacco comments:    smoked 1961-1972, up to 1 ppd  Vaping Use   Vaping status: Never Used  Substance and Sexual Activity   Alcohol use: Yes    Comment: minimally, 6 beers/year   Drug use: No   Sexual activity: Not on file

## 2024-05-21 ENCOUNTER — Other Ambulatory Visit: Payer: Self-pay | Admitting: Radiology

## 2024-05-21 DIAGNOSIS — M79672 Pain in left foot: Secondary | ICD-10-CM

## 2024-05-23 ENCOUNTER — Ambulatory Visit

## 2024-05-23 ENCOUNTER — Encounter: Payer: Self-pay | Admitting: Internal Medicine

## 2024-05-23 ENCOUNTER — Ambulatory Visit: Payer: Medicare Other | Admitting: Internal Medicine

## 2024-05-23 VITALS — BP 136/72 | HR 72 | Temp 98.2°F | Ht 69.0 in | Wt 194.0 lb

## 2024-05-23 DIAGNOSIS — Z7984 Long term (current) use of oral hypoglycemic drugs: Secondary | ICD-10-CM | POA: Diagnosis not present

## 2024-05-23 DIAGNOSIS — Z0000 Encounter for general adult medical examination without abnormal findings: Secondary | ICD-10-CM | POA: Diagnosis not present

## 2024-05-23 DIAGNOSIS — E1159 Type 2 diabetes mellitus with other circulatory complications: Secondary | ICD-10-CM

## 2024-05-23 DIAGNOSIS — N138 Other obstructive and reflux uropathy: Secondary | ICD-10-CM | POA: Diagnosis not present

## 2024-05-23 DIAGNOSIS — I2510 Atherosclerotic heart disease of native coronary artery without angina pectoris: Secondary | ICD-10-CM | POA: Diagnosis not present

## 2024-05-23 DIAGNOSIS — K2100 Gastro-esophageal reflux disease with esophagitis, without bleeding: Secondary | ICD-10-CM | POA: Diagnosis not present

## 2024-05-23 DIAGNOSIS — I10 Essential (primary) hypertension: Secondary | ICD-10-CM

## 2024-05-23 DIAGNOSIS — N401 Enlarged prostate with lower urinary tract symptoms: Secondary | ICD-10-CM

## 2024-05-23 NOTE — Assessment & Plan Note (Signed)
 No symptoms on BP meds, ASA and rosuvastatin  10 daily

## 2024-05-23 NOTE — Assessment & Plan Note (Signed)
 Healthy Still recovering from left TKR Done with screening for cancer Trying to increase exercise RSV before the winter Flu/COVID updates in the fall

## 2024-05-23 NOTE — Assessment & Plan Note (Signed)
 BP Readings from Last 3 Encounters:  05/23/24 136/72  12/18/23 130/60  12/07/23 (!) 150/84   Controlled on lisinopril  40, metoprolol  25 bid, amlodipine  10

## 2024-05-23 NOTE — Assessment & Plan Note (Signed)
 Controlled again on omeprazole 20 every other day

## 2024-05-23 NOTE — Assessment & Plan Note (Signed)
 Also with urethral stricture Doing okay for now with just the tamsulosin 

## 2024-05-23 NOTE — Assessment & Plan Note (Signed)
 Hopefully still acceptable control on metformin  500 bid

## 2024-05-23 NOTE — Progress Notes (Signed)
 Subjective:    Patient ID: Peter Blair, male    DOB: Nov 18, 1941, 83 y.o.   MRN: [id number]  HPI Here for physical  Had left TKR in January Prolonged stay due to snow Trouble getting Foley in--then had to go home with catheter Did okay with this Now still having hip pain--MRI shows arthritis. Will likely need this replaced  Is walking better--but still limps Also with left Achilles pain---plan shock wave Rx  Checks sugars---average fasting is 147 No hypoglycemic reactions No numbness or foot burning  Average BP is 132/69  Current Outpatient Medications on File Prior to Visit  Medication Sig Dispense Refill   Accu-Chek FastClix Lancets MISC 1 each by In Vitro route daily. 102 each 3   amLODipine  (NORVASC ) 10 MG tablet TAKE 1 TABLET BY MOUTH EVERY DAY 90 tablet 3   aspirin  81 MG chewable tablet Chew 1 tablet (81 mg total) by mouth 2 (two) times daily. 30 tablet 0   chlorhexidine  (HIBICLENS ) 4 % external liquid Apply 15 mLs (1 Application total) topically as directed for 30 doses. Use as directed daily for 5 days every other week for 6 weeks. 946 mL 1   Coenzyme Q10 200 MG TABS Take 1 tablet by mouth daily.   0   gabapentin  (NEURONTIN ) 600 MG tablet Take 1 tablet (600 mg total) by mouth at bedtime. Take one half tablet at bedtime 30 tablet 0   glucose blood (ACCU-CHEK GUIDE) test strip USE 1 STRIP ONCE DAILY 100 each 3   HYDROcodone -acetaminophen  (NORCO/VICODIN) 5-325 MG tablet Take 1 tablet by mouth every 6 (six) hours as needed for moderate pain (pain score 4-6). 30 tablet 0   lisinopril  (ZESTRIL ) 40 MG tablet TAKE 1 TABLET BY MOUTH EVERY DAY 90 tablet 3   metFORMIN  (GLUCOPHAGE ) 500 MG tablet TAKE 1 TABLET BY MOUTH TWICE A DAY 180 tablet 3   methocarbamol  (ROBAXIN ) 500 MG tablet Take 1 tablet (500 mg total) by mouth every 6 (six) hours as needed. 40 tablet 1   metoprolol  tartrate (LOPRESSOR ) 25 MG tablet TAKE 1 TABLET BY MOUTH TWICE A DAY 180 tablet 3   omeprazole (PRILOSEC) 20  MG capsule Take 20 mg by mouth daily as needed.     rosuvastatin  (CRESTOR ) 10 MG tablet TAKE 1 TABLET BY MOUTH EVERYDAY AT BEDTIME 90 tablet 3   sildenafil  (REVATIO ) 20 MG tablet TAKE 3 TO 5 TABLETS BY MOUTH DAILY AS DIRECTED (Patient taking differently: Take 3-5 tablets by mouth as needed (ED).) 50 tablet 5   tamsulosin  (FLOMAX ) 0.4 MG CAPS capsule Take 1 capsule (0.4 mg total) by mouth daily. 90 capsule 3   No current facility-administered medications on file prior to visit.    Allergies  Allergen Reactions   Iodinated Contrast Media     Other reaction(s): NAUSEA   Iohexol     Itchy eyes, dry mouth in early 1990s during imaging for renal calculi    Past Medical History:  Diagnosis Date   Coronary artery disease    exertional chest pain, cath 12/10 showed EF 50-55% with mild inferior hypokensis, 99% proximal and 60% distal RCA stenosis, 80% prox stenosis of a moderate sized D1, 40% prox LAD. Patient had BMS x2 placed in RCA. Unstable Angina. PROMUS DES to the prox RCA   Diabetes mellitus without complication (HCC)    Erectile dysfunction    GERD (gastroesophageal reflux disease)    Hyperlipidemia    Hypertension    Nephrolithiasis    Dr. Aram Knights  Renal cyst    Right shoulder pain    rotator cuff   Skin cancer    Skin cancer, PMH of, cell type unknown, Dr. Del Favia    Past Surgical History:  Procedure Laterality Date   CATARACT EXTRACTION W/ INTRAOCULAR LENS IMPLANT Bilateral    colonoscopy with ploypectomy  03/05/2012    Dr Adan Holms, GI   CORONARY ANGIOPLASTY WITH STENT PLACEMENT     X 3 total stents   cystoscopic removal  04/04/2010   CYSTOSCOPY N/A 12/15/2023   Procedure: CYSTOSCOPY FLEXIBLE; URETHERAL DILATION; INSERTION OF FOLEY CATHETER;  Surgeon: Andrez Banker, MD;  Location: [facility];  Service: Urology;  Laterality: N/A;   Flexibe sigmoidoscopy   12/06/1999   LITHOTRIPSY     x 1   RECONSTRUCTION OF EYELID Left    TOTAL KNEE ARTHROPLASTY Left 12/15/2023    Procedure: LEFT TOTAL KNEE ARTHROPLASTY;  Surgeon: Arnie Lao, MD;  Location: [facility];  Service: Orthopedics;  Laterality: Left;    Family History  Problem Relation Age of Onset   Breast cancer Mother    Cervical cancer Mother    Diabetes Father    Heart attack Father 54   Breast cancer Sister    Colon cancer Sister        mets to liver & lung   Stroke Neg Hx     Social History   Socioeconomic History   Marital status: Married    Spouse name: Not on file   Number of children: 0   Years of education: Not on file   Highest education level: Not on file  Occupational History   Occupation: Sports coach    Comment: Retired  Tobacco Use   Smoking status: Former    Current packs/day: 0.00    Types: Cigarettes    Quit date: 12/05/1970    Years since quitting: 53.5    Passive exposure: Never   Smokeless tobacco: Never   Tobacco comments:    smoked 1961-1972, up to 1 ppd  Vaping Use   Vaping status: Never Used  Substance and Sexual Activity   Alcohol use: Yes    Comment: minimally, 6 beers/year   Drug use: No   Sexual activity: Not on file  Other Topics Concern   Not on file  Social History Narrative   Has living will    Wife is health care POA-- no alternate set up (??niece or sister)   Would accept resuscitation attempts   No extended tube feedings if cognitively unaware   Social Drivers of Health   Financial Resource Strain: Not on file  Food Insecurity: No Food Insecurity (12/19/2023)   Hunger Vital Sign    Worried About Running Out of Food in the Last Year: Never true    Ran Out of Food in the Last Year: Never true  Transportation Needs: No Transportation Needs (12/19/2023)   PRAPARE - Administrator, Civil Service (Medical): No    Lack of Transportation (Non-Medical): No  Physical Activity: Not on file  Stress: Not on file  Social Connections: Socially Integrated (12/19/2023)   Social Connection and Isolation Panel    Frequency of  Communication with Friends and Family: More than three times a week    Frequency of Social Gatherings with Friends and Family: More than three times a week    Attends Religious Services: More than 4 times per year    Active Member of [employer] or Organizations: Yes    Attends Banker  Meetings: More than 4 times per year    Marital Status: Married  Catering manager Violence: Not At Risk (12/19/2023)   Humiliation, Afraid, Rape, and Kick questionnaire    Fear of Current or Ex-Partner: No    Emotionally Abused: No    Physically Abused: No    Sexually Abused: No   Review of Systems  Constitutional:  Negative for unexpected weight change.       Wears seat belt Some fatigue from being out of shape  HENT:  Negative for dental problem, hearing loss and tinnitus.        Keeps up with dentist  Eyes:  Negative for visual disturbance.       No diplopia or unilateral vision loss  Respiratory:  Negative for cough, chest tightness and shortness of breath.   Cardiovascular:  Positive for leg swelling. Negative for chest pain and palpitations.  Gastrointestinal:  Negative for blood in stool and constipation.       Occasional heartburn --uses the omeprazole about every other day Just one episode of getting food stuck--no recurrence (before restarting the omeprazole)  Endocrine: Negative for polydipsia and polyuria.  Genitourinary:        Still with trouble voiding--slow and split stream On tamsulosin  but still with urethral stricture  Musculoskeletal:  Positive for arthralgias.       Minor back pain at times--hard to bend over  Skin:  Negative for rash.       No suspicious lesions  Allergic/Immunologic: Positive for environmental allergies. Negative for immunocompromised state.       Using allergy eye drops  Neurological:  Positive for light-headedness. Negative for dizziness, syncope and headaches.  Hematological:  Negative for adenopathy. Bruises/bleeds easily.   Psychiatric/Behavioral:  Negative for dysphoric mood. The patient is not nervous/anxious.        Sleeping better again--after knee pain better       Objective:   Physical Exam Constitutional:      Appearance: Normal appearance.  HENT:     Mouth/Throat:     Pharynx: No oropharyngeal exudate or posterior oropharyngeal erythema.   Eyes:     Conjunctiva/sclera: Conjunctivae normal.     Pupils: Pupils are equal, round, and reactive to light.    Cardiovascular:     Rate and Rhythm: Normal rate and regular rhythm.     Pulses: Normal pulses.     Heart sounds: No murmur heard.    No gallop.  Pulmonary:     Effort: Pulmonary effort is normal.     Breath sounds: Normal breath sounds. No wheezing or rales.  Abdominal:     Palpations: Abdomen is soft.     Tenderness: There is no abdominal tenderness.   Musculoskeletal:     Cervical back: Neck supple.     Right lower leg: No edema.     Left lower leg: No edema.     Comments: Post op left knee swelling  Lymphadenopathy:     Cervical: No cervical adenopathy.   Skin:    Findings: No lesion or rash.   Neurological:     General: No focal deficit present.     Mental Status: He is alert and oriented to person, place, and time.   Psychiatric:        Mood and Affect: Mood normal.        Behavior: Behavior normal.            Assessment & Plan:

## 2024-05-24 ENCOUNTER — Ambulatory Visit: Payer: Self-pay | Admitting: Internal Medicine

## 2024-05-24 LAB — CBC
HCT: 43.3 % (ref 39.0–52.0)
Hemoglobin: 14.6 g/dL (ref 13.0–17.0)
MCHC: 33.8 g/dL (ref 30.0–36.0)
MCV: 89.2 fl (ref 78.0–100.0)
Platelets: 167 10*3/uL (ref 150.0–400.0)
RBC: 4.86 Mil/uL (ref 4.22–5.81)
RDW: 14.8 % (ref 11.5–15.5)
WBC: 6.1 10*3/uL (ref 4.0–10.5)

## 2024-05-24 LAB — LIPID PANEL
Cholesterol: 120 mg/dL (ref 0–200)
HDL: 39.7 mg/dL (ref >39.00)
LDL Cholesterol: 61 mg/dL (ref 0–99)
NonHDL: 80.64
Total CHOL/HDL Ratio: 3
Triglycerides: 100 mg/dL (ref 0.0–149.0)
VLDL: 20 mg/dL (ref 0.0–40.0)

## 2024-05-24 LAB — COMPREHENSIVE METABOLIC PANEL WITH GFR
ALT: 15 U/L (ref 0–53)
AST: 13 U/L (ref 0–37)
Albumin: 4.3 g/dL (ref 3.5–5.2)
Alkaline Phosphatase: 60 U/L (ref 39–117)
BUN: 19 mg/dL (ref 6–23)
CO2: 26 meq/L (ref 19–32)
Calcium: 9.6 mg/dL (ref 8.4–10.5)
Chloride: 103 meq/L (ref 96–112)
Creatinine, Ser: 1.03 mg/dL (ref 0.40–1.50)
GFR: 67.93 mL/min (ref >60.00)
Glucose, Bld: 113 mg/dL — ABNORMAL HIGH (ref 70–99)
Potassium: 4.6 meq/L (ref 3.5–5.1)
Sodium: 140 meq/L (ref 135–145)
Total Bilirubin: 0.5 mg/dL (ref 0.2–1.2)
Total Protein: 7 g/dL (ref 6.0–8.3)

## 2024-05-24 LAB — MICROALBUMIN / CREATININE URINE RATIO
Creatinine,U: 108.9 mg/dL
Microalb Creat Ratio: 41.6 mg/g — ABNORMAL HIGH (ref 0.0–30.0)
Microalb, Ur: 4.5 mg/dL — ABNORMAL HIGH (ref 0.0–1.9)

## 2024-05-24 LAB — HEMOGLOBIN A1C: Hgb A1c MFr Bld: 8 % — ABNORMAL HIGH (ref 4.6–6.5)

## 2024-06-03 ENCOUNTER — Encounter: Payer: Self-pay | Admitting: Sports Medicine

## 2024-06-03 ENCOUNTER — Ambulatory Visit: Admitting: Sports Medicine

## 2024-06-03 DIAGNOSIS — E1159 Type 2 diabetes mellitus with other circulatory complications: Secondary | ICD-10-CM | POA: Diagnosis not present

## 2024-06-03 DIAGNOSIS — M7662 Achilles tendinitis, left leg: Secondary | ICD-10-CM | POA: Diagnosis not present

## 2024-06-03 DIAGNOSIS — M7752 Other enthesopathy of left foot: Secondary | ICD-10-CM

## 2024-06-03 DIAGNOSIS — M79672 Pain in left foot: Secondary | ICD-10-CM

## 2024-06-03 MED ORDER — PREDNISONE 20 MG PO TABS
20.0000 mg | ORAL_TABLET | Freq: Every day | ORAL | 0 refills | Status: AC
Start: 2024-06-03 — End: 2024-06-10

## 2024-06-03 NOTE — Progress Notes (Signed)
 Peter Blair - 83 y.o. male MRN [id number]  Date of birth: July 28, 1941  Office Visit Note: Visit Date: 06/03/2024 PCP: Jimmy Charlie FERNS, MD Referred by: Gretta Bertrum ORN, PA-C  Subjective: Chief Complaint  Patient presents with   Left Ankle - Pain    Here for shockwave consult. Hx of achilles tendinitis about 20 years ago. Has had shockwave in the past. Recent xrays in PACS. Very tender to touch.   HPI: Peter Blair is a pleasant 83 y.o. male who presents today for acute on chronic left posterior ankle pain.  He is status post left knee arthroplasty back in January.  He saw Tory Gretta who referred him to me for treatment of his left posterior heel pain.  He has a history of Achilles tendinitis many years ago and did undergo treatment which sounds like shockwave treatment at that time.  It is tender to the touch, he does note some swelling.  No inciting injury, this was insidious onset. Heel has been bothersome for about 5 weeks or so.  He is a type-II diabetic.  Had a recent bump in his A1c just checked recently.  He is on metformin  500 mg twice daily. Lab Results  Component Value Date   HGBA1C 8.0 (H) 05/23/2024   Pertinent ROS were reviewed with the patient and found to be negative unless otherwise specified above in HPI.   Assessment & Plan: Visit Diagnoses:  1. 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 Pain of left heel   3. Retrocalcaneal bursitis (back of heel), left   4. Type 2 diabetes mellitus with other circulatory complications (HCC)    Plan: Impression is subacute to chronic left posterior heel pain which does have evidence of active calcific Achilles tendinitis with concomitant retrocalcaneal bursitis.  There is calcaneal heel spurring as well.  We discussed all treatment options and did move forward with a first treatment of extracorporeal shockwave therapy.  Given the degree of bursitis and inflammation, I would like to treat him with a low-dose of  prednisone 20 mg for 7 days only.  This low-dose should be fine with his diabetes.  He will ice as well as use over-the-counter anti-inflammatories only as needed.  I would like to see him back in about 2 weeks to repeat shockwave treatment and see what sort of cumulative benefit he has received.  Additional treatment considerations: Offloading in heel lift or silicone heel padding, injection therapy  Follow-up: Return in about 2 weeks (around 06/17/2024) for for L-achilles (reg visit).   Meds & Orders:  Meds ordered this encounter  Medications   predniSONE (DELTASONE) 20 MG tablet    Sig: Take 1 tablet (20 mg total) by mouth daily with breakfast for 7 days.    Dispense:  7 tablet    Refill:  0   No orders of the defined types were placed in this encounter.    Procedures: Procedure: ECSWT Indications:  Achilles tendinitis, Heel spur   Procedure Details Consent: Risks of procedure as well as the alternatives and risks of each were explained to the patient.  Verbal consent for procedure obtained. Time Out: Verified patient identification, verified procedure, site was marked, verified correct patient position. The area was cleaned with alcohol swab.     The Left achilles and superior calcaneus was targeted for Extracorporeal shockwave therapy.    Preset: Achillodynia Power Level: 100 mJ (80 at insert) Frequency: 11 Hz    Impulse/cycles: 2400 Head size: Regular  Patient tolerated procedure well without immediate complications.       Clinical History: No specialty comments available.  He reports that he quit smoking about 53 years ago. His smoking use included cigarettes. He has never been exposed to tobacco smoke. He has never used smokeless tobacco.  Recent Labs    11/23/23 1409 05/23/24 1449  HGBA1C 7.3* 8.0*    Objective:    Physical Exam  Gen: Well-appearing, in no acute distress; non-toxic CV: Well-perfused. Warm.  Resp: Breathing unlabored on room air; no  wheezing. Psych: Fluid speech in conversation; appropriate affect; normal thought process  Ortho Exam - Left foot/ankle: + TTP over the superior aspect of the calcaneus and the distal Achilles insertion.  There is a degree of retrocalcaneal bursitis with mild swelling in this location.  There is an intact Thompson squeeze test.  Ankle dorsiflexion to about 90-95 degrees with mild reproduction of pain.  Negative calcaneal heel squeeze, no tenderness at the plantar fascia origin.  Imaging:  *2 views of the left heel including posterior Kalisetti's and lateral film was ordered and reviewed by myself.  X-rays demonstrate notable plantar calcaneal spurring as well as superior calcaneal spurring near the insertion of the Achilles tendon.  There is calcification present within the location of the Achilles itself.  Mild Haglund deformity.  There is notable vascular calcification noted as well.  XR Os Calcis Left Left heel 2 views: No acute fracture.  Haglund's deformity.   Arthrosclerosis noted.  Past Medical/Family/Surgical/Social History: Medications & Allergies reviewed per EMR, new medications updated. Patient Active Problem List   Diagnosis Date Noted   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/15/2023   Unilateral primary osteoarthritis, right hip 09/25/2023   Actinic keratosis 10/17/2018   Type 2 diabetes mellitus with other circulatory complications (HCC) 12/11/2017   Osteoarthritis, multiple sites 10/11/2017   Preventative health care 08/05/2015   Advance directive discussed with patient 08/05/2015   GERD (gastroesophageal reflux disease)    Hyperplastic colonic polyp 12/17/2013   Benign prostatic hyperplasia with urinary obstruction 01/16/2013   ED (erectile dysfunction) of organic origin 01/16/2013   Coronary atherosclerosis of native coronary artery 11/20/2009   NEPHROLITHIASIS 09/29/2009   RENAL CYST, RIGHT 09/29/2009   Hyperlipemia 09/16/2008   SKIN CANCER, HX OF 09/16/2008    Essential hypertension, benign 07/09/2007   Past Medical History:  Diagnosis Date   Coronary artery disease    exertional chest pain, cath 12/10 showed EF 50-55% with mild inferior hypokensis, 99% proximal and 60% distal RCA stenosis, 80% prox stenosis of a moderate sized D1, 40% prox LAD. Patient had BMS x2 placed in RCA. Unstable Angina. PROMUS DES to the prox RCA   Diabetes mellitus without complication (HCC)    Erectile dysfunction    GERD (gastroesophageal reflux disease)    Hyperlipidemia    Hypertension    Nephrolithiasis    Dr. Ike   Renal cyst    Right shoulder pain    rotator cuff   Skin cancer    Skin cancer, PMH of, cell type unknown, Dr. Shona   Family History  Problem Relation Age of Onset   Breast cancer Mother    Cervical cancer Mother    Diabetes Father    Heart attack Father 8   Breast cancer Sister    Colon cancer Sister        mets to liver & lung   Stroke Neg Hx    Past Surgical History:  Procedure Laterality Date   CATARACT  EXTRACTION W/ INTRAOCULAR LENS IMPLANT Bilateral    colonoscopy with ploypectomy  03/05/2012    Dr Jakie, GI   CORONARY ANGIOPLASTY WITH STENT PLACEMENT     X 3 total stents   cystoscopic removal  04/04/2010   CYSTOSCOPY N/A 12/15/2023   Procedure: CYSTOSCOPY FLEXIBLE; URETHERAL DILATION; INSERTION OF FOLEY CATHETER;  Surgeon: Cam Morene ORN, MD;  Location: [facility];  Service: Urology;  Laterality: N/A;   Flexibe sigmoidoscopy   12/06/1999   LITHOTRIPSY     x 1   RECONSTRUCTION OF EYELID Left    TOTAL KNEE ARTHROPLASTY Left 12/15/2023   Procedure: LEFT TOTAL KNEE ARTHROPLASTY;  Surgeon: Vernetta Lonni GRADE, MD;  Location: [facility];  Service: Orthopedics;  Laterality: Left;   Social History   Occupational History   Occupation: Sports coach    Comment: Retired  Tobacco Use   Smoking status: Former    Current packs/day: 0.00    Types: Cigarettes    Quit date: 12/05/1970    Years since quitting: 53.5     Passive exposure: Never   Smokeless tobacco: Never   Tobacco comments:    smoked 1961-1972, up to 1 ppd  Vaping Use   Vaping status: Never Used  Substance and Sexual Activity   Alcohol use: Yes    Comment: minimally, 6 beers/year   Drug use: No   Sexual activity: Not on file

## 2024-06-14 ENCOUNTER — Other Ambulatory Visit: Payer: Self-pay | Admitting: Internal Medicine

## 2024-06-17 ENCOUNTER — Encounter: Payer: Self-pay | Admitting: Sports Medicine

## 2024-06-17 ENCOUNTER — Ambulatory Visit: Admitting: Orthopedic Surgery

## 2024-06-17 ENCOUNTER — Ambulatory Visit: Admitting: Sports Medicine

## 2024-06-17 DIAGNOSIS — M25551 Pain in right hip: Secondary | ICD-10-CM

## 2024-06-17 DIAGNOSIS — M7662 Achilles tendinitis, left leg: Secondary | ICD-10-CM

## 2024-06-17 DIAGNOSIS — M79672 Pain in left foot: Secondary | ICD-10-CM | POA: Diagnosis not present

## 2024-06-17 DIAGNOSIS — M1611 Unilateral primary osteoarthritis, right hip: Secondary | ICD-10-CM | POA: Diagnosis not present

## 2024-06-17 DIAGNOSIS — G8929 Other chronic pain: Secondary | ICD-10-CM

## 2024-06-17 NOTE — Progress Notes (Signed)
 Peter Blair - 83 y.o. male MRN [id number]  Date of birth: 1941/05/05  Office Visit Note: Visit Date: 06/17/2024 PCP: Jimmy Charlie FERNS, MD Referred by: Jimmy Charlie FERNS, MD  Subjective: Chief Complaint  Patient presents with   Left Ankle - Follow-up   HPI: Peter Blair is a pleasant 83 y.o. male who presents today for follow-up of left Achilles and heel spur.  Also having progressive right hip pain.  Left achilles/heel -back at the end of June he did complete a low-dose of prednisone  for 1 week.  We also proceeded with extracorporeal shockwave therapy, he received relief from both of these.  His pain is not completely resolved, but he notices vast improvement.  He is not noticing pain on a day-to-day basis with the heel.  Right hip/leg -his right hip/anterior leg has been most bothersome as of recently.  He has been seeing Dr. Lorane Gaskins for this.  We did proceed with ultrasound-guided right hip injection back on 09/29/2023.  This gave him partial relief at that time but did not significantly take away his pain.  He then subsequently had x-ray imaging of the lumbar spine to evaluate for radicular symptoms, although per Waldemar nothing further was worked up.  He does have very occasional numbness and tingling that will go down into the foot but most of his pain is in the anterior hip with moving/twisting and getting in and out of his car.  Pertinent ROS were reviewed with the patient and found to be negative unless otherwise specified above in HPI.   Assessment & Plan: Visit Diagnoses:  1. 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 Pain of left heel   3. Unilateral primary osteoarthritis, right hip   4. Chronic right hip pain    Plan: Impression is acute on chronic resolving calcific Achilles tendinitis with heel spurring.  He did receive good relief from a short prednisone  course and extracorporeal shockwave therapy.  We did repeat a second treatment today,  given his improvement, we will hold on further treatment for now, but could consider additional down the road only if needed.  He is also having right anterior hip/leg pain with very intermittent numbness tingling down the leg.  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  He does have provocative maneuvers coming from the intra-articular hip joint, although previous hip injection only gave him partial relief.  I would like to see him back in about 2-3 weeks and have him pay attention over this time to see the exact location of his pain and if he is having true radiculopathy and reevaluate both the hip and the back and determine next steps which may include diagnostic/therapeutic hip injection versus further imaging of the lumbar spine via MRI. He is agreeable to this plan. Ok for Tylenol  as needed.  Follow-up: Return in 2-3 weeks  Meds & Orders: No orders of the defined types were placed in this encounter.  No orders of the defined types were placed in this encounter.    Procedures: Procedure: ECSWT Indications:  Achilles tendinitis, Heel spur   Procedure Details Consent: Risks of procedure as well as the alternatives and risks of each were explained to the patient.  Verbal consent for procedure obtained. Time Out: Verified patient identification, verified procedure, site was marked, verified  correct patient position. The area was cleaned with alcohol swab.     The Left achilles and superior calcaneus was targeted for Extracorporeal shockwave therapy.    Preset: Achillodynia Power Level: 100 mJ (80-90 mJ at insert)  Frequency: 12 Hz    Impulse/cycles: 2600 Head size: Regular   Patient tolerated procedure well without immediate complications.      Clinical History: No specialty comments available.  He reports that he quit smoking  about 53 years ago. His smoking use included cigarettes. He has never been exposed to tobacco smoke. He has never used smokeless tobacco.  Recent Labs    11/23/23 1409 05/23/24 1449  HGBA1C 7.3* 8.0*    Objective:    Physical Exam  Gen: Well-appearing, in no acute distress; non-toxic CV: Well-perfused. Warm.  Resp: Breathing unlabored on room air; no wheezing. Psych: Fluid speech in conversation; appropriate affect; normal thought process  Ortho Exam - Left foot/ankle: There is some bony bossing off the superior aspect of the calcaneus near the distal Achilles insertion.  There is improved retrocalcaneal bursitis, with only very trivial soft tissue swelling today.  Negative calcaneal heel squeeze.  - Right hip: No redness swelling or effusion.  There is no significant greater trochanteric TTP.  There is some limitation with internal logroll and positive FADIR test.  Negative Stinchfield testing today.  Imaging:  *2 view lumbar spine x-ray from 12/04/2023 was independently reviewed interpreted by myself today.  X-rays demonstrate mild to moderate lumbar degenerative disc disease throughout the already of the lumbar spine, there is a grade 1+ spondylolisthesis with L4 on L5 anterior listhesis present.  No compression disc height loss.  12/04/23: 2 views of the lumbar spine show a grade 1 spondylolisthesis between L4  and L5.   Past Medical/Family/Surgical/Social History: Medications & Allergies reviewed per EMR, new medications updated. Patient Active Problem List   Diagnosis Date Noted   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/15/2023   Unilateral primary osteoarthritis, right hip 09/25/2023   Actinic keratosis 10/17/2018   Type 2 diabetes mellitus with other circulatory complications (HCC) 12/11/2017   Osteoarthritis, multiple sites 10/11/2017   Preventative health care 08/05/2015   Advance directive discussed with patient 08/05/2015   GERD (gastroesophageal reflux disease)     Hyperplastic colonic polyp 12/17/2013   Benign prostatic hyperplasia with urinary obstruction 01/16/2013   ED (erectile dysfunction) of organic origin 01/16/2013   Coronary atherosclerosis of native coronary artery 11/20/2009   NEPHROLITHIASIS 09/29/2009   RENAL CYST, RIGHT 09/29/2009   Hyperlipemia 09/16/2008   SKIN CANCER, HX OF 09/16/2008   Essential hypertension, benign 07/09/2007   Past Medical History:  Diagnosis Date   Coronary artery disease    exertional chest pain, cath 12/10 showed EF 50-55% with mild inferior hypokensis, 99% proximal and 60% distal RCA stenosis, 80% prox stenosis of a moderate sized D1, 40% prox LAD. Patient had BMS x2 placed in RCA. Unstable Angina. PROMUS DES to the prox RCA   Diabetes mellitus without complication (HCC)    Erectile dysfunction    GERD (gastroesophageal reflux disease)    Hyperlipidemia    Hypertension    Nephrolithiasis    Dr. Ike   Renal cyst    Right shoulder pain    rotator cuff   Skin cancer    Skin cancer, PMH of, cell type unknown, Dr. Shona   Family History  Problem Relation Age of Onset   Breast cancer Mother    Cervical cancer Mother    Diabetes  Father    Heart attack Father 17   Breast cancer Sister    Colon cancer Sister        mets to liver & lung   Stroke Neg Hx    Past Surgical History:  Procedure Laterality Date   CATARACT EXTRACTION W/ INTRAOCULAR LENS IMPLANT Bilateral    colonoscopy with ploypectomy  03/05/2012    Dr Jakie, GI   CORONARY ANGIOPLASTY WITH STENT PLACEMENT     X 3 total stents   cystoscopic removal  04/04/2010   CYSTOSCOPY N/A 12/15/2023   Procedure: CYSTOSCOPY FLEXIBLE; URETHERAL DILATION; INSERTION OF FOLEY CATHETER;  Surgeon: Cam Morene ORN, MD;  Location: [facility];  Service: Urology;  Laterality: N/A;   Flexibe sigmoidoscopy   12/06/1999   LITHOTRIPSY     x 1   RECONSTRUCTION OF EYELID Left    TOTAL KNEE ARTHROPLASTY Left 12/15/2023   Procedure: LEFT TOTAL KNEE  ARTHROPLASTY;  Surgeon: Vernetta Lonni GRADE, MD;  Location: [facility];  Service: Orthopedics;  Laterality: Left;   Social History   Occupational History   Occupation: Sports coach    Comment: Retired  Tobacco Use   Smoking status: Former    Current packs/day: 0.00    Types: Cigarettes    Quit date: 12/05/1970    Years since quitting: 53.5    Passive exposure: Never   Smokeless tobacco: Never   Tobacco comments:    smoked 1961-1972, up to 1 ppd  Vaping Use   Vaping status: Never Used  Substance and Sexual Activity   Alcohol use: Yes    Comment: minimally, 6 beers/year   Drug use: No   Sexual activity: Not on file

## 2024-06-17 NOTE — Progress Notes (Signed)
 Patient says that his achilles has improved. He has been doing his stretches which seem to help.

## 2024-07-04 ENCOUNTER — Other Ambulatory Visit: Payer: Self-pay

## 2024-07-04 ENCOUNTER — Ambulatory Visit: Admitting: Sports Medicine

## 2024-07-04 ENCOUNTER — Encounter: Payer: Self-pay | Admitting: Sports Medicine

## 2024-07-04 DIAGNOSIS — M7662 Achilles tendinitis, left leg: Secondary | ICD-10-CM | POA: Diagnosis not present

## 2024-07-04 DIAGNOSIS — M1611 Unilateral primary osteoarthritis, right hip: Secondary | ICD-10-CM

## 2024-07-04 DIAGNOSIS — E1159 Type 2 diabetes mellitus with other circulatory complications: Secondary | ICD-10-CM

## 2024-07-04 DIAGNOSIS — M25551 Pain in right hip: Secondary | ICD-10-CM | POA: Diagnosis not present

## 2024-07-04 DIAGNOSIS — M79672 Pain in left foot: Secondary | ICD-10-CM

## 2024-07-04 DIAGNOSIS — G8929 Other chronic pain: Secondary | ICD-10-CM

## 2024-07-04 MED ORDER — LIDOCAINE HCL 1 % IJ SOLN
4.0000 mL | INTRAMUSCULAR | Status: AC | PRN
  Administered 2024-07-04: 4 mL

## 2024-07-04 MED ORDER — METHYLPREDNISOLONE ACETATE 40 MG/ML IJ SUSP
40.0000 mg | INTRAMUSCULAR | Status: AC | PRN
Start: 2024-07-04 — End: 2024-07-04
  Administered 2024-07-04: 40 mg via INTRA_ARTICULAR

## 2024-07-04 NOTE — Progress Notes (Signed)
 Patient says that his left heel is about 80% improved since doing the shockwave therapy.  He is here today for right hip/leg pain. He says that since is last visit about 2.5 weeks ago, he has noticed that he has tingling in the right lower leg and foot about 50% of the time. He does not notice a pattern of when this tingling happens. He says that he continues to have right hip pain through the front and side of the hip, which is worse when he lifts the leg to get into or out of the car. He has pain through the front of the leg to the knee, but does not notice tingling in this part of the leg, the way that he does the lower leg. He denies any low back pain.

## 2024-07-04 NOTE — Progress Notes (Signed)
 Peter Blair - 83 y.o. male MRN [id number]  Date of birth: Jul 09, 1941  Office Visit Note: Visit Date: 07/04/2024 PCP: Jimmy Charlie FERNS, MD Referred by: Jimmy Charlie FERNS, MD  Subjective: Chief Complaint  Patient presents with   Right Hip - Follow-up   Left Ankle - Follow-up   HPI: Peter Blair is a pleasant 83 y.o. male who presents today for follow-up of right hip, left achilles, R-foot n/t.  Right hip -he is still having pain within the hip that radiates down the thigh.  He has pain with getting in and out of a car and with lifting that leg.  Left achilles - the Achilles and heel feels at least 80% improved after 2 treatments of extracorporeal shockwave therapy.  He really does not notice much pain at rest or to touch.  Right foot -he does get some tingling about the entire foot that comes and goes.  He does not notice the tingling emanating from the leg and traveling all the way down. He is a type-II diabetic.  He does check his sugars in the morning every few days.  Lab Results  Component Value Date   HGBA1C 8.0 (H) 05/23/2024   Pertinent ROS were reviewed with the patient and found to be negative unless otherwise specified above in HPI.   Assessment & Plan: Visit Diagnoses:  1. Unilateral primary osteoarthritis, right hip   2. Chronic right hip pain   3. 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 Pain of left heel   5. Type 2 diabetes mellitus with other circulatory complications (HCC)    Plan: Impression is chronic right hip pain with osteoarthritis and femoral acetabular impingement.  Through shared decision making, did proceed with ultrasound-guided right hip intra-articular injection, patient tolerated well.  We will see what degree of relief he receives over the next month and he may follow-up depending on improvement.  His calcific Achilles tendinitis on the left side and left heel pain is much improved after 2 shockwave treatments.  We will observe  this over the next month and if he plateaus or does not continue to improve, we could consider follow-up for a few additional shockwave treatment therapies.  He is a type II diabetic, he will check his blood glucose as needed, did discuss transient rise given the corticosteroid injection.  He will continue his metformin  500 mg twice daily and follow-up with his PCP regarding this.  Follow-up: Return in about 1 month (around 08/04/2024), or if symptoms worsen or fail to improve.   Meds & Orders: No orders of the defined types were placed in this encounter.   Orders Placed This Encounter  Procedures   Large Joint Inj   US  Guided Needle Placement - No Linked Charges     Procedures: Large Joint Inj: R hip joint on 07/04/2024 9:41 AM Indications: pain Details: 22 G 3.5 in needle, ultrasound-guided anterior approach Medications: 4 mL lidocaine  1 %; 40 mg methylPREDNISolone  acetate 40 MG/ML Outcome: tolerated well, no immediate complications  Procedure: US -guided intra-articular hip injection, Right After discussion on risks/benefits/indications and informed verbal consent was obtained, a timeout was performed. Patient was lying supine on exam table. The hip was cleaned with betadine  and alcohol swabs. Then utilizing ultrasound guidance, the patient's femoral head and neck junction was identified and subsequently injected with 4:1 lidocaine :depomedrol via an in-plane approach with ultrasound visualization of the injectate administered into the hip joint. Patient tolerated procedure well without immediate complications.  Procedure,  treatment alternatives, risks and benefits explained, specific risks discussed. Consent was given by the patient. Immediately prior to procedure a time out was called to verify the correct patient, procedure, equipment, support staff and site/side marked as required. Patient was prepped and draped in the usual sterile fashion.          Clinical History: No specialty  comments available.  He reports that he quit smoking about 53 years ago. His smoking use included cigarettes. He has never been exposed to tobacco smoke. He has never used smokeless tobacco.  Recent Labs    11/23/23 1409 05/23/24 1449  HGBA1C 7.3* 8.0*    Objective:    Physical Exam  Gen: Well-appearing, in no acute distress; non-toxic CV: Well-perfused. Warm.  Resp: Breathing unlabored on room air; no wheezing. Psych: Fluid speech in conversation; appropriate affect; normal thought process  Ortho Exam - Right hip: There is no redness swelling or effusion.  There is about 7 degrees less of internal rotation with pain with internal logroll.  Positive FADIR and Stinchfield testing.  - Bilateral feet: There is no tenderness of the left Achilles or the distal calcaneus.  The right foot has 1+ dorsalis pedis and posterior tibial pulse, cap refill less than 2 seconds.  Imaging:  09/25/23: An AP pelvis and lateral the right hip shows superior lateral flattening  of the femoral head neck area with some signs of femoral acetabular  impingement.  There is slight narrowing of the joint space.     Past Medical/Family/Surgical/Social History: Medications & Allergies reviewed per EMR, new medications updated. Patient Active Problem List   Diagnosis Date Noted   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/15/2023   Unilateral primary osteoarthritis, right hip 09/25/2023   Actinic keratosis 10/17/2018   Type 2 diabetes mellitus with other circulatory complications (HCC) 12/11/2017   Osteoarthritis, multiple sites 10/11/2017   Preventative health care 08/05/2015   Advance directive discussed with patient 08/05/2015   GERD (gastroesophageal reflux disease)    Hyperplastic colonic polyp 12/17/2013   Benign prostatic hyperplasia with urinary obstruction 01/16/2013   ED (erectile dysfunction) of organic origin 01/16/2013   Coronary atherosclerosis of native coronary artery 11/20/2009    NEPHROLITHIASIS 09/29/2009   RENAL CYST, RIGHT 09/29/2009   Hyperlipemia 09/16/2008   SKIN CANCER, HX OF 09/16/2008   Essential hypertension, benign 07/09/2007   Past Medical History:  Diagnosis Date   Coronary artery disease    exertional chest pain, cath 12/10 showed EF 50-55% with mild inferior hypokensis, 99% proximal and 60% distal RCA stenosis, 80% prox stenosis of a moderate sized D1, 40% prox LAD. Patient had BMS x2 placed in RCA. Unstable Angina. PROMUS DES to the prox RCA   Diabetes mellitus without complication (HCC)    Erectile dysfunction    GERD (gastroesophageal reflux disease)    Hyperlipidemia    Hypertension    Nephrolithiasis    Dr. Ike   Renal cyst    Right shoulder pain    rotator cuff   Skin cancer    Skin cancer, PMH of, cell type unknown, Dr. Shona   Family History  Problem Relation Age of Onset   Breast cancer Mother    Cervical cancer Mother    Diabetes Father    Heart attack Father 81   Breast cancer Sister    Colon cancer Sister        mets to liver & lung   Stroke Neg Hx    Past Surgical History:  Procedure Laterality  Date   CATARACT EXTRACTION W/ INTRAOCULAR LENS IMPLANT Bilateral    colonoscopy with ploypectomy  03/05/2012    Dr Jakie, GI   CORONARY ANGIOPLASTY WITH STENT PLACEMENT     X 3 total stents   cystoscopic removal  04/04/2010   CYSTOSCOPY N/A 12/15/2023   Procedure: CYSTOSCOPY FLEXIBLE; URETHERAL DILATION; INSERTION OF FOLEY CATHETER;  Surgeon: Cam Morene ORN, MD;  Location: [facility];  Service: Urology;  Laterality: N/A;   Flexibe sigmoidoscopy   12/06/1999   LITHOTRIPSY     x 1   RECONSTRUCTION OF EYELID Left    TOTAL KNEE ARTHROPLASTY Left 12/15/2023   Procedure: LEFT TOTAL KNEE ARTHROPLASTY;  Surgeon: Vernetta Lonni GRADE, MD;  Location: [facility];  Service: Orthopedics;  Laterality: Left;   Social History   Occupational History   Occupation: Sports coach    Comment: Retired  Tobacco Use   Smoking  status: Former    Current packs/day: 0.00    Types: Cigarettes    Quit date: 12/05/1970    Years since quitting: 53.6    Passive exposure: Never   Smokeless tobacco: Never   Tobacco comments:    smoked 1961-1972, up to 1 ppd  Vaping Use   Vaping status: Never Used  Substance and Sexual Activity   Alcohol use: Yes    Comment: minimally, 6 beers/year   Drug use: No   Sexual activity: Not on file

## 2024-08-21 ENCOUNTER — Ambulatory Visit (INDEPENDENT_AMBULATORY_CARE_PROVIDER_SITE_OTHER): Payer: Self-pay

## 2024-08-21 ENCOUNTER — Encounter: Payer: Self-pay | Admitting: Sports Medicine

## 2024-08-21 ENCOUNTER — Other Ambulatory Visit: Payer: Self-pay

## 2024-08-21 ENCOUNTER — Ambulatory Visit (INDEPENDENT_AMBULATORY_CARE_PROVIDER_SITE_OTHER): Admitting: Sports Medicine

## 2024-08-21 ENCOUNTER — Ambulatory Visit: Admitting: Orthopaedic Surgery

## 2024-08-21 DIAGNOSIS — G8929 Other chronic pain: Secondary | ICD-10-CM

## 2024-08-21 DIAGNOSIS — M25511 Pain in right shoulder: Secondary | ICD-10-CM

## 2024-08-21 MED ORDER — METHYLPREDNISOLONE ACETATE 40 MG/ML IJ SUSP
40.0000 mg | INTRAMUSCULAR | Status: AC | PRN
  Administered 2024-08-21: 40 mg via INTRA_ARTICULAR

## 2024-08-21 MED ORDER — LIDOCAINE HCL 1 % IJ SOLN
2.0000 mL | INTRAMUSCULAR | Status: AC | PRN
  Administered 2024-08-21: 2 mL

## 2024-08-21 MED ORDER — BUPIVACAINE HCL 0.25 % IJ SOLN
2.0000 mL | INTRAMUSCULAR | Status: AC | PRN
  Administered 2024-08-21: 2 mL via INTRA_ARTICULAR

## 2024-08-21 NOTE — Progress Notes (Signed)
   Procedure Note  Patient: Peter Blair             Date of Birth: [birth year]           MRN: [id number]             Visit Date: 08/21/2024  Procedures: Visit Diagnoses:  1. Chronic right shoulder pain    Large Joint Inj: R glenohumeral on 08/21/2024 11:28 AM Indications: pain Details: 22 G 3.5 in needle, ultrasound-guided posterior approach Medications: 2 mL lidocaine  1 %; 2 mL bupivacaine  0.25 %; 40 mg methylPREDNISolone  acetate 40 MG/ML Outcome: tolerated well, no immediate complications  US -guided glenohumeral joint injection, right shoulder After discussion on risks/benefits/indications, informed verbal consent was obtained. A timeout was then performed. The patient was positioned lying lateral recumbent on examination table. The patient's shoulder was prepped with betadine  and multiple alcohol swabs and utilizing ultrasound guidance, the patient's glenohumeral joint was identified on ultrasound. Using ultrasound guidance a 22-gauge, 3.5 inch needle with a mixture of 2:2:1 cc's lidocaine :bupivicaine:depomedrol was directed from a lateral to medial direction via in-plane technique into the glenohumeral joint with visualization of appropriate spread of injectate into the joint. Patient tolerated the procedure well without immediate complications.      Procedure, treatment alternatives, risks and benefits explained, specific risks discussed. Consent was given by the patient. Immediately prior to procedure a time out was called to verify the correct patient, procedure, equipment, support staff and site/side marked as required. Patient was prepped and draped in the usual sterile fashion.     - patient tolerated procedure well, discussed post-injection protocol - follow-up with Dr. Jerri as indicated; I am happy to see them as needed  Lonell Sprang, DO Primary Care Sports Medicine Physician  [facility] - [facility]  This note was dictated using Dragon naturally speaking  software and may contain errors in syntax, spelling, or content which have not been identified prior to signing this note.

## 2024-08-21 NOTE — Progress Notes (Signed)
 Office Visit Note   Patient: Peter Blair           Date of Birth: [birth year]           MRN: [id number] Visit Date: 08/21/2024              Requested by: Jimmy Charlie FERNS, MD No address on file PCP: Jimmy Charlie FERNS, MD   Assessment & Plan: Visit Diagnoses:  1. Chronic right shoulder pain     Plan: History of Present Illness Peter Blair is an 83 year old male who presents with right shoulder pain.  He has experienced right shoulder pain for at least two months, with occasional radiation to the neck, which has resolved. The pain varies in intensity and can disrupt sleep, described as similar to a 'toothache'. It worsens with arm elevation, reaching, and external rotation, and is alleviated by certain positions. He also notes weakness during specific movements.  There is no specific injury reported, but he attributes the pain to his active lifestyle, including working on cars and using a chainsaw. He has received cortisone injections for pain management, most recently in his hip eight weeks ago, and underwent joint replacement surgery in January.  Physical Exam MUSCULOSKELETAL: Tenderness over right acromioclavicular joint. Pain on external rotation and resisted abduction of right shoulder. Weakness and pain on manual muscle testing of infraspinatus and supraspinatus.  Results RADIOLOGY Right shoulder X-ray: Arthritic changes in the acromioclavicular joint, mild arthritic changes in the glenohumeral joint, degenerative spurring at the greater tuberosity.  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 X-rays confirm degenerative changes. Surgical intervention not recommended due to age and degeneration. - Refer to physical therapy for shoulder strengthening exercises. - Arrange intra-articular cortisone injection in right shoulder with  Dr. Burnetta.  Follow-Up Instructions: No follow-ups on file.   Orders:  Orders Placed This Encounter  Procedures   XR Shoulder Right   Ambulatory referral to Physical Therapy   No orders of the defined types were placed in this encounter.     Procedures: No procedures performed   Clinical Data: No additional findings.   Subjective: Chief Complaint  Patient presents with   Right Shoulder - Pain    HPI  Review of Systems  Constitutional: Negative.   HENT: Negative.    Eyes: Negative.   Respiratory: Negative.    Cardiovascular: Negative.   Gastrointestinal: Negative.   Endocrine: Negative.   Genitourinary: Negative.   Skin: Negative.   Allergic/Immunologic: Negative.   Neurological: Negative.   Hematological: Negative.   Psychiatric/Behavioral: Negative.    All other systems reviewed and are negative.    Objective: Vital Signs: There were no vitals taken for this visit.  Physical Exam Vitals and nursing note reviewed.  Constitutional:      Appearance: He is well-developed.  HENT:     Head: Normocephalic and atraumatic.  Eyes:     Pupils: Pupils are equal, round, and reactive to light.  Pulmonary:     Effort: Pulmonary effort is normal.  Abdominal:     Palpations: Abdomen is soft.  Musculoskeletal:        General: Normal range of motion.     Cervical back: Neck supple.  Skin:    General: Skin is warm.  Neurological:     Mental Status: He is alert and oriented to person, place, and time.  Psychiatric:  Behavior: Behavior normal.        Thought Content: Thought content normal.        Judgment: Judgment normal.     Ortho Exam  Specialty Comments:  No specialty comments available.  Imaging: XR Shoulder Right Result Date: 08/21/2024 X-rays of the right shoulder show advanced arthritic changes of the [facility] joint.  Minimal arthritic changes of the glenohumeral joint.  Mild degenerative irregularity of the greater tuberosity.    PMFS  History: Patient Active Problem List   Diagnosis Date Noted   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/15/2023   Unilateral primary osteoarthritis, right hip 09/25/2023   Actinic keratosis 10/17/2018   Type 2 diabetes mellitus with other circulatory complications (HCC) 12/11/2017   Osteoarthritis, multiple sites 10/11/2017   Preventative health care 08/05/2015   Advance directive discussed with patient 08/05/2015   GERD (gastroesophageal reflux disease)    Hyperplastic colonic polyp 12/17/2013   Benign prostatic hyperplasia with urinary obstruction 01/16/2013   ED (erectile dysfunction) of organic origin 01/16/2013   Coronary atherosclerosis of native coronary artery 11/20/2009   NEPHROLITHIASIS 09/29/2009   RENAL CYST, RIGHT 09/29/2009   Hyperlipemia 09/16/2008   SKIN CANCER, HX OF 09/16/2008   Essential hypertension, benign 07/09/2007   Past Medical History:  Diagnosis Date   Coronary artery disease    exertional chest pain, cath 12/10 showed EF 50-55% with mild inferior hypokensis, 99% proximal and 60% distal RCA stenosis, 80% prox stenosis of a moderate sized D1, 40% prox LAD. Patient had BMS x2 placed in RCA. Unstable Angina. PROMUS DES to the prox RCA   Diabetes mellitus without complication (HCC)    Erectile dysfunction    GERD (gastroesophageal reflux disease)    Hyperlipidemia    Hypertension    Nephrolithiasis    Dr. Ike   Renal cyst    Right shoulder pain    rotator cuff   Skin cancer    Skin cancer, PMH of, cell type unknown, Dr. Shona    Family History  Problem Relation Age of Onset   Breast cancer Mother    Cervical cancer Mother    Diabetes Father    Heart attack Father 65   Breast cancer Sister    Colon cancer Sister        mets to liver & lung   Stroke Neg Hx     Past Surgical History:  Procedure Laterality Date   CATARACT EXTRACTION W/ INTRAOCULAR LENS IMPLANT Bilateral    colonoscopy with ploypectomy  03/05/2012    Dr Jakie, GI   CORONARY  ANGIOPLASTY WITH STENT PLACEMENT     X 3 total stents   cystoscopic removal  04/04/2010   CYSTOSCOPY N/A 12/15/2023   Procedure: CYSTOSCOPY FLEXIBLE; URETHERAL DILATION; INSERTION OF FOLEY CATHETER;  Surgeon: Cam Morene ORN, MD;  Location: [facility];  Service: Urology;  Laterality: N/A;   Flexibe sigmoidoscopy   12/06/1999   LITHOTRIPSY     x 1   RECONSTRUCTION OF EYELID Left    TOTAL KNEE ARTHROPLASTY Left 12/15/2023   Procedure: LEFT TOTAL KNEE ARTHROPLASTY;  Surgeon: Vernetta Lonni GRADE, MD;  Location: [facility];  Service: Orthopedics;  Laterality: Left;   Social History   Occupational History   Occupation: Sports coach    Comment: Retired  Tobacco Use   Smoking status: Former    Current packs/day: 0.00    Types: Cigarettes    Quit date: 12/05/1970    Years since quitting: 53.7    Passive exposure: Never  Smokeless tobacco: Never   Tobacco comments:    smoked 1961-1972, up to 1 ppd  Vaping Use   Vaping status: Never Used  Substance and Sexual Activity   Alcohol use: Yes    Comment: minimally, 6 beers/year   Drug use: No   Sexual activity: Not on file

## 2024-09-16 ENCOUNTER — Ambulatory Visit: Attending: Orthopaedic Surgery

## 2024-09-16 DIAGNOSIS — M6281 Muscle weakness (generalized): Secondary | ICD-10-CM | POA: Insufficient documentation

## 2024-09-16 DIAGNOSIS — M25611 Stiffness of right shoulder, not elsewhere classified: Secondary | ICD-10-CM | POA: Diagnosis present

## 2024-09-16 DIAGNOSIS — G8929 Other chronic pain: Secondary | ICD-10-CM | POA: Insufficient documentation

## 2024-09-16 DIAGNOSIS — M25511 Pain in right shoulder: Secondary | ICD-10-CM | POA: Insufficient documentation

## 2024-09-16 NOTE — Therapy (Signed)
 OUTPATIENT PHYSICAL THERAPY SHOULDER EVALUATION   Patient Name: Peter Blair MRN: [id number] DOB:30-Aug-1941, 83 y.o., male Today's Date: 09/16/2024  END OF SESSION:  PT End of Session - 09/16/24 1306     Visit Number 1    Date for Recertification  12/09/24    Authorization Type [employer]    PT Start Time 1308    PT Stop Time 1350    PT Time Calculation (min) 42 min          Past Medical History:  Diagnosis Date   Coronary artery disease    exertional chest pain, cath 12/10 showed EF 50-55% with mild inferior hypokensis, 99% proximal and 60% distal RCA stenosis, 80% prox stenosis of a moderate sized D1, 40% prox LAD. Patient had BMS x2 placed in RCA. Unstable Angina. PROMUS DES to the prox RCA   Diabetes mellitus without complication (HCC)    Erectile dysfunction    GERD (gastroesophageal reflux disease)    Hyperlipidemia    Hypertension    Nephrolithiasis    Dr. Ike   Renal cyst    Right shoulder pain    rotator cuff   Skin cancer    Skin cancer, PMH of, cell type unknown, Dr. Shona   Past Surgical History:  Procedure Laterality Date   CATARACT EXTRACTION W/ INTRAOCULAR LENS IMPLANT Bilateral    colonoscopy with ploypectomy  03/05/2012    Dr Jakie, GI   CORONARY ANGIOPLASTY WITH STENT PLACEMENT     X 3 total stents   cystoscopic removal  04/04/2010   CYSTOSCOPY N/A 12/15/2023   Procedure: CYSTOSCOPY FLEXIBLE; URETHERAL DILATION; INSERTION OF FOLEY CATHETER;  Surgeon: Cam Morene ORN, MD;  Location: [facility];  Service: Urology;  Laterality: N/A;   Flexibe sigmoidoscopy   12/06/1999   LITHOTRIPSY     x 1   RECONSTRUCTION OF EYELID Left    TOTAL KNEE ARTHROPLASTY Left 12/15/2023   Procedure: LEFT TOTAL KNEE ARTHROPLASTY;  Surgeon: Vernetta Lonni GRADE, MD;  Location: [facility];  Service: Orthopedics;  Laterality: Left;   Patient Active Problem List   Diagnosis Date Noted   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/15/2023   Unilateral primary osteoarthritis,  right hip 09/25/2023   Actinic keratosis 10/17/2018   Type 2 diabetes mellitus with other circulatory complications (HCC) 12/11/2017   Osteoarthritis, multiple sites 10/11/2017   Preventative health care 08/05/2015   Advance directive discussed with patient 08/05/2015   GERD (gastroesophageal reflux disease)    Hyperplastic colonic polyp 12/17/2013   Benign prostatic hyperplasia with urinary obstruction 01/16/2013   ED (erectile dysfunction) of organic origin 01/16/2013   Coronary atherosclerosis of native coronary artery 11/20/2009   NEPHROLITHIASIS 09/29/2009   RENAL CYST, RIGHT 09/29/2009   Hyperlipemia 09/16/2008   SKIN CANCER, HX OF 09/16/2008   Essential hypertension, benign 07/09/2007    PCP: Charlie Denise  REFERRING PROVIDER: Kay Cummins  REFERRING DIAG:  [phone] (ICD-10-CM) - Chronic right shoulder pain    THERAPY DIAG:  No diagnosis found.  Rationale for Evaluation and Treatment: Rehabilitation  ONSET DATE: 08/21/24 he reported about two month ago  SUBJECTIVE:  SUBJECTIVE STATEMENT: They gave a cortisone shot and it helped for a while but it is back now and just a constant ache. I don't know what caused it but I think it is just wear and tear over the years. 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   Hand dominance: Right  PERTINENT HISTORY: 08/21/24--Peter Blair is an 83 year old male who presents with right shoulder pain.   He has experienced right shoulder pain for at least two months, with occasional radiation to the neck, which has resolved. The pain varies in intensity and can disrupt sleep, described as similar to a 'toothache'. It worsens with arm elevation, reaching, and external rotation, and is alleviated by certain  positions. He also notes weakness during specific movements.   There is no specific injury reported, but he attributes the pain to his active lifestyle, including working on cars and using a chainsaw. He has received cortisone injections for pain management, most recently in his hip eight weeks ago, and underwent joint replacement surgery in January.  PAIN:  Are you having pain? Yes: NPRS scale: 5/10 Pain location: R shoulder, sometimes radiates down arm to elbow Pain description: constant ache  Aggravating factors: lifting, reaching, carrying  Relieving factors: the cortisone shot did for a while, pain meds sometimes   PRECAUTIONS: None  RED FLAGS: None   WEIGHT BEARING RESTRICTIONS: No  FALLS:  Has patient fallen in last 6 months? No  LIVING ENVIRONMENT: Lives with: lives with their spouse Lives in: House/apartment Stairs: Yes: Internal: steps to go upstairs and to the basement steps; on right going up and External: 3 steps; on right going up  OCCUPATION: Retired- like to be out in the yard and fish, works on cars some   PLOF: Independent  PATIENT GOALS:just tried to get rid of some pain   NEXT MD VISIT:   OBJECTIVE:  Note: Objective measures were completed at Evaluation unless otherwise noted.  DIAGNOSTIC FINDINGS:  RADIOLOGY X-rays of the right shoulder show advanced arthritic changes of the [facility] joint. Minimal arthritic changes of the glenohumeral joint. Mild degenerative irregularity and spurring of the greater tuberosity.   PATIENT SURVEYS:  QuickDash 45.5/100  COGNITION: Overall cognitive status: Within functional limits for tasks assessed     SENSATION: WFL  POSTURE: Rounded shoulders   UPPER EXTREMITY ROM:   Active ROM Right eval Left eval  Shoulder flexion 130 w/pain   Shoulder extension    Shoulder abduction 150 w/pain   Shoulder adduction    Shoulder internal rotation WFL   Shoulder external rotation WFL w/pain at end range   Elbow flexion     Elbow extension    Wrist flexion    Wrist extension    Wrist ulnar deviation    Wrist radial deviation    Wrist pronation    Wrist supination    (Blank rows = not tested)  UPPER EXTREMITY MMT:  MMT Right eval Left eval  Shoulder flexion 4- with pain   Shoulder extension    Shoulder abduction 4- with pain   Shoulder adduction    Shoulder internal rotation 5   Shoulder external rotation 3+ with pain   Middle trapezius    Lower trapezius    Elbow flexion    Elbow extension    Wrist flexion    Wrist extension    Wrist ulnar deviation    Wrist radial deviation    Wrist pronation  Wrist supination    Grip strength (lbs)    (Blank rows = not tested)  SHOULDER SPECIAL TESTS: Impingement tests: Neer impingement test: positive , Hawkins/Kennedy impingement test: negative, and Painful arc test: positive  Rotator cuff assessment: Empty can test: positive   JOINT MOBILITY TESTING:  Full PROM with pain   PALPATION:  Tenderness over right acromioclavicular joint. Pain on external rotation and resisted abduction of right shoulder. Weakness and pain on manual muscle testing of infraspinatus and supraspinatus.                                                                                                                            TREATMENT DATE:  09/16/24 EVALUATION   PATIENT EDUCATION: Education details: POC, HEP, shoulder anatomy  Person educated: Patient Education method: Explanation and Demonstration Education comprehension: verbalized understanding and returned demonstration  HOME EXERCISE PROGRAM: Access Code: [email or web address] URL: [email or web address] Date: 09/16/2024 Prepared by: Almetta Fam  Exercises - Standing Shoulder Row with Anchored Resistance  - 1 x daily - 7 x weekly - 2 sets - 10 reps - Shoulder extension with resistance - Neutral  - 1 x daily - 7 x weekly - 2 sets - 10 reps - Shoulder External Rotation and Scapular Retraction with Resistance   - 1 x daily - 7 x weekly - 2 sets - 10 reps (use yellow band)  - Standing Shoulder Horizontal Abduction with Resistance  - 1 x daily - 7 x weekly - 2 sets - 10 reps - Doorway Pec Stretch at 90 Degrees Abduction  - 1 x daily - 7 x weekly - 2 reps - 15 hold  ASSESSMENT:  CLINICAL IMPRESSION: Patient is a 83 y.o. male who was seen today for physical therapy evaluation and treatment for R shoulder pain. 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 He may also have some impingement. He had a recent cortisone shot that provided him some relief. Patient presents with some ROM deficits and pain with resisted motions. Most of his pain was caused with resisted ER. He reports pain with lifting and reaching over head. Patient will benefit from PT to address his pain in order to improve ROM and strength to increase function with R shoulder with ADLs, household chores, and hobbies.   OBJECTIVE IMPAIRMENTS: decreased ROM, decreased strength, impaired UE functional use, and pain.   ACTIVITY LIMITATIONS: carrying, lifting, and reach over head  PARTICIPATION LIMITATIONS: cleaning, laundry, and yard work  PERSONAL FACTORS: Age and Time since onset of injury/illness/exacerbation are also affecting patient's functional outcome.   REHAB POTENTIAL: Good  CLINICAL DECISION MAKING: Stable/uncomplicated  EVALUATION COMPLEXITY: Low   GOALS: Goals reviewed with patient? Yes  SHORT TERM GOALS: Target date: 10/28/24  Patient will be independent with initial HEP.  Baseline:  Goal status: INITIAL   LONG TERM GOALS: Target date: 12/10/23  Patient will be independent with advanced/ongoing HEP to improve outcomes and carryover.  Baseline:  Goal status: INITIAL  2.  Patient will report 75% improvement in R shoulder pain to improve QOL.  Baseline:  Goal status: INITIAL  3.  Patient to improve R shoulder AROM to WNL without pain provocation to allow for increased ease of ADLs.  Baseline:   Goal status: INITIAL  4.  Patient will demonstrate improved functional UE strength as demonstrated by 5/5 in all mm groups. Baseline: see chart above Goal status: INITIAL  5.  Patient will report 10 points improvement on QuickDash to demonstrate improved functional ability.  Baseline: 45.5 Goal status: INITIAL  PLAN:  PT FREQUENCY: 1-2x/week  PT DURATION: 12 weeks  PLANNED INTERVENTIONS: 97110-Therapeutic exercises, 97530- Therapeutic activity, [id number]- Neuromuscular re-education, 97535- Self Care, [id number]- Manual therapy, 97035- Ultrasound, [id number]- Ionotophoresis 4mg /ml Dexamethasone , Patient/Family education, Taping, Joint mobilization, Cryotherapy, and Moist heat  PLAN FOR NEXT SESSION: R shoulder strengthening, esp with ER, postural strengthening, modalities as needed for pain    Almetta Fam, PT 09/16/2024, 1:51 PM

## 2024-09-25 NOTE — Progress Notes (Signed)
 Cardiology Office Note:    Date:  10/09/2024   ID:  Peter Blair, DOB 07-28-1941, MRN [id number]  PCP:  Jimmy Charlie FERNS, MD   [facility] Providers Cardiologist:  None     Referring MD: Jimmy Charlie FERNS, MD   Chief Complaint  Patient presents with   Coronary Artery Disease    History of Present Illness:    Peter Blair is a 83 y.o. male seen at the request of Dr Jimmy to reestablish cardiac cath. Followed in the past by Dr Rolan. Last seen in 2017. He is s/p stenting of the proximal and distal RCA in 2010 with 4.0 x 20 Veriflex and 3.5 x 15 mm Veriflex BMS. In 2011 he had in stent restenosis in the proximal RCA treated with a 4.0 x 23 mm Promus DES dilated to 4.5 mm. Had negative Myoview study in 2013. He has a history of HTN and HLD. Prior intolerance listed to lipitor.   On follow up today he reports that for 3 months he has experienced recurrent angina. For him this is pain in his upper teeth and tightness radiating across his upper chest. No nausea or sweats. No dyspnea. Resolves with rest. Worse with activity such as walking stairs or walking up driveway. Has not used Ntg. States symptoms are becoming more frequent and on a couple of occasions was bad enough that he almost went to ED. He does have a number of orthopedic issues. And may need THR.   Past Medical History:  Diagnosis Date   Coronary artery disease    exertional chest pain, cath 12/10 showed EF 50-55% with mild inferior hypokensis, 99% proximal and 60% distal RCA stenosis, 80% prox stenosis of a moderate sized D1, 40% prox LAD. Patient had BMS x2 placed in RCA. Unstable Angina. PROMUS DES to the prox RCA   Diabetes mellitus without complication (HCC)    Erectile dysfunction    GERD (gastroesophageal reflux disease)    Hyperlipidemia    Hypertension    Nephrolithiasis    Dr. Ike   Renal cyst    Right shoulder pain    rotator cuff   Skin cancer    Skin cancer, PMH of, cell type unknown, Dr.  Shona    Past Surgical History:  Procedure Laterality Date   CATARACT EXTRACTION W/ INTRAOCULAR LENS IMPLANT Bilateral    colonoscopy with ploypectomy  03/05/2012    Dr Jakie, GI   CORONARY ANGIOPLASTY WITH STENT PLACEMENT     X 3 total stents   cystoscopic removal  04/04/2010   CYSTOSCOPY N/A 12/15/2023   Procedure: CYSTOSCOPY FLEXIBLE; URETHERAL DILATION; INSERTION OF FOLEY CATHETER;  Surgeon: Cam Morene ORN, MD;  Location: [facility];  Service: Urology;  Laterality: N/A;   Flexibe sigmoidoscopy   12/06/1999   LITHOTRIPSY     x 1   RECONSTRUCTION OF EYELID Left    TOTAL KNEE ARTHROPLASTY Left 12/15/2023   Procedure: LEFT TOTAL KNEE ARTHROPLASTY;  Surgeon: Vernetta Lonni GRADE, MD;  Location: [facility];  Service: Orthopedics;  Laterality: Left;    Current Medications: Current Meds  Medication Sig   Accu-Chek FastClix Lancets MISC 1 each by In Vitro route daily.   amLODipine  (NORVASC ) 10 MG tablet TAKE 1 TABLET BY MOUTH EVERY DAY   aspirin  81 MG chewable tablet Chew 1 tablet (81 mg total) by mouth 2 (two) times daily.   Coenzyme Q10 200 MG TABS Take 1 tablet by mouth daily.    glucose blood (ACCU-CHEK GUIDE)  test strip USE 1 STRIP ONCE DAILY   HYDROcodone -acetaminophen  (NORCO/VICODIN) 5-325 MG tablet Take 1 tablet by mouth every 6 (six) hours as needed for moderate pain (pain score 4-6).   lisinopril  (ZESTRIL ) 40 MG tablet TAKE 1 TABLET BY MOUTH EVERY DAY   metFORMIN  (GLUCOPHAGE ) 500 MG tablet TAKE 1 TABLET BY MOUTH TWICE A DAY   methocarbamol  (ROBAXIN ) 500 MG tablet Take 1 tablet (500 mg total) by mouth every 6 (six) hours as needed.   metoprolol  tartrate (LOPRESSOR ) 25 MG tablet TAKE 1 TABLET BY MOUTH TWICE A DAY   omeprazole (PRILOSEC) 20 MG capsule Take 20 mg by mouth daily as needed.   rosuvastatin  (CRESTOR ) 10 MG tablet TAKE 1 TABLET BY MOUTH EVERYDAY AT BEDTIME   sildenafil  (REVATIO ) 20 MG tablet TAKE 3 TO 5 TABLETS BY MOUTH DAILY AS DIRECTED   tamsulosin  (FLOMAX ) 0.4 MG  CAPS capsule TAKE 1 CAPSULE BY MOUTH EVERY DAY     Allergies:   Iodinated contrast media and Iohexol   Social History   Socioeconomic History   Marital status: Married    Spouse name: Not on file   Number of children: 0   Years of education: Not on file   Highest education level: Not on file  Occupational History   Occupation: Sports coach    Comment: Retired  Tobacco Use   Smoking status: Former    Current packs/day: 0.00    Types: Cigarettes    Quit date: 12/05/1970    Years since quitting: 53.8    Passive exposure: Never   Smokeless tobacco: Never   Tobacco comments:    smoked 1961-1972, up to 1 ppd  Vaping Use   Vaping status: Never Used  Substance and Sexual Activity   Alcohol use: Yes    Comment: minimally, 6 beers/year   Drug use: No   Sexual activity: Not on file  Other Topics Concern   Not on file  Social History Narrative   Has living will    Wife is health care POA-- no alternate set up (??niece or sister)   Would accept resuscitation attempts   No extended tube feedings if cognitively unaware   Social Drivers of Health   Financial Resource Strain: Not on file  Food Insecurity: No Food Insecurity (12/19/2023)   Hunger Vital Sign    Worried About Running Out of Food in the Last Year: Never true    Ran Out of Food in the Last Year: Never true  Transportation Needs: No Transportation Needs (12/19/2023)   PRAPARE - Administrator, Civil Service (Medical): No    Lack of Transportation (Non-Medical): No  Physical Activity: Not on file  Stress: Not on file  Social Connections: Socially Integrated (12/19/2023)   Social Connection and Isolation Panel    Frequency of Communication with Friends and Family: More than three times a week    Frequency of Social Gatherings with Friends and Family: More than three times a week    Attends Religious Services: More than 4 times per year    Active Member of [employer] or Organizations: Yes    Attends Museum/gallery Exhibitions Officer: More than 4 times per year    Marital Status: Married     Family History: The patient's family history includes Breast cancer in his mother and sister; Cervical cancer in his mother; Colon cancer in his sister; Diabetes in his father; Heart attack (age of onset: 54) in his father. There is no history of Stroke.  ROS:   Please see the history of present illness.     All other systems reviewed and are negative.  EKGs/Labs/Other Studies Reviewed:    The following studies were reviewed today: EKG Interpretation Date/Time:  Wednesday October 09 2024 09:39:32 EST Ventricular Rate:  74 PR Interval:  172 QRS Duration:  134 QT Interval:  426 QTC Calculation: 472 R Axis:   -19  Text Interpretation: Normal sinus rhythm Right bundle branch block Minimal voltage criteria for LVH, may be normal variant ( R in aVL ) When compared with ECG of 07-Dec-2023 14:38, No significant change was found Confirmed by Shondra Capps [phone]) on 10/09/2024 9:44:23 AM  Recent Labs: 05/23/2024: ALT 15; BUN 19; Creatinine, Ser 1.03; Hemoglobin 14.6; Platelets 167.0; Potassium 4.6; Sodium 140  Recent Lipid Panel    Component Value Date/Time   CHOL 120 05/23/2024 1449   TRIG 100.0 05/23/2024 1449   HDL 39.70 05/23/2024 1449   CHOLHDL 3 05/23/2024 1449   VLDL 20.0 05/23/2024 1449   LDLCALC 61 05/23/2024 1449   LDLDIRECT 66.2 09/16/2013 1553     Risk Assessment/Calculations:       Physical Exam:    VS:  BP (!) 148/70   Pulse 74   Ht 5' 9 (1.753 m)   Wt 192 lb (87.1 kg)   SpO2 97%   BMI 28.35 kg/m     Wt Readings from Last 3 Encounters:  10/09/24 192 lb (87.1 kg)  05/23/24 194 lb (88 kg)  12/15/23 196 lb 15.7 oz (89.4 kg)     GEN:  Well nourished, well developed in no acute distress HEENT: Normal NECK: No JVD; No carotid bruits LYMPHATICS: No lymphadenopathy CARDIAC: RRR, no murmurs, rubs, gallops RESPIRATORY:  Clear to auscultation without rales, wheezing or rhonchi   ABDOMEN: Soft, non-tender, non-distended MUSCULOSKELETAL:  No edema; No deformity  SKIN: Warm and dry NEUROLOGIC:  Alert and oriented x 3 PSYCHIATRIC:  Normal affect   ASSESSMENT:    1. Coronary artery disease involving native coronary artery of native heart with unstable angina pectoris (HCC)   2. Essential hypertension, benign   3. Type 2 diabetes mellitus without complication, without long-term current use of insulin  (HCC)   4. Hypercholesterolemia    PLAN:    In order of problems listed above:  CAD s/p prior BMS of proximal and distal RCA in 2010. S/p DES of proximal RCA in 2011 for restenosis. Now with accelerating anginal symptoms despite good medical therapy with statin, ASA, metoprolol  and amlodipine . Recommend cardiac cath with possible PCI. The procedure and risks were reviewed including but not limited to death, myocardial infarction, stroke, arrythmias, bleeding, transfusion, emergency surgery, dye allergy, or renal dysfunction. The patient voices understanding and is agreeable to proceed.  Will arrange for Nov 12.  DM on metformin . Last A1c 8%. May need to consider GLP 1 inhibitor HLD. On Crestor  at maximally tolerated dose. LDL 61 HTN on multiple meds.       Informed Consent   Shared Decision Making/Informed Consent The risks [stroke (1 in 1000), death (1 in 1000), kidney failure [usually temporary] (1 in 500), bleeding (1 in 200), allergic reaction [possibly serious] (1 in 200)], benefits (diagnostic support and management of coronary artery disease) and alternatives of a cardiac catheterization were discussed in detail with Mr. Nasca and he is willing to proceed.       Medication Adjustments/Labs and Tests Ordered: Current medicines are reviewed at length with the patient today.  Concerns regarding medicines are outlined above.  Orders Placed This Encounter  Procedures   EKG 12-Lead   No orders of the defined types were placed in this encounter.   Patient  Instructions  Medication Instructions:   *If you need a refill on your cardiac medications before your next appointment, please call your pharmacy*  Lab Work:   Testing/Procedures:   Follow-Up: At [facility], you and your health needs are our priority.  As part of our continuing mission to provide you with exceptional heart care, our providers are all part of one team.  This team includes your primary Cardiologist (physician) and Advanced Practice Providers or APPs (Physician Assistants and Nurse Practitioners) who all work together to provide you with the care you need, when you need it.  Your next appointment:      Provider:     We recommend signing up for the patient portal called MyChart.  Sign up information is provided on this After Visit Summary.  MyChart is used to connect with patients for Virtual Visits (Telemedicine).  Patients are able to view lab/test results, encounter notes, upcoming appointments, etc.  Non-urgent messages can be sent to your provider as well.   To learn more about what you can do with MyChart, go to [email or web address].      Signed, Sofiya Ezelle, MD  10/09/2024 10:09 AM    [facility]

## 2024-09-25 NOTE — H&P (View-Only) (Signed)
 Cardiology Office Note:    Date:  10/09/2024   ID:  Peter Blair, DOB 07-28-1941, MRN [id number]  PCP:  Jimmy Charlie FERNS, MD   [facility] Providers Cardiologist:  None     Referring MD: Jimmy Charlie FERNS, MD   Chief Complaint  Patient presents with   Coronary Artery Disease    History of Present Illness:    Peter Blair is a 83 y.o. male seen at the request of Dr Jimmy to reestablish cardiac cath. Followed in the past by Dr Rolan. Last seen in 2017. He is s/p stenting of the proximal and distal RCA in 2010 with 4.0 x 20 Veriflex and 3.5 x 15 mm Veriflex BMS. In 2011 he had in stent restenosis in the proximal RCA treated with a 4.0 x 23 mm Promus DES dilated to 4.5 mm. Had negative Myoview study in 2013. He has a history of HTN and HLD. Prior intolerance listed to lipitor.   On follow up today he reports that for 3 months he has experienced recurrent angina. For him this is pain in his upper teeth and tightness radiating across his upper chest. No nausea or sweats. No dyspnea. Resolves with rest. Worse with activity such as walking stairs or walking up driveway. Has not used Ntg. States symptoms are becoming more frequent and on a couple of occasions was bad enough that he almost went to ED. He does have a number of orthopedic issues. And may need THR.   Past Medical History:  Diagnosis Date   Coronary artery disease    exertional chest pain, cath 12/10 showed EF 50-55% with mild inferior hypokensis, 99% proximal and 60% distal RCA stenosis, 80% prox stenosis of a moderate sized D1, 40% prox LAD. Patient had BMS x2 placed in RCA. Unstable Angina. PROMUS DES to the prox RCA   Diabetes mellitus without complication (HCC)    Erectile dysfunction    GERD (gastroesophageal reflux disease)    Hyperlipidemia    Hypertension    Nephrolithiasis    Dr. Ike   Renal cyst    Right shoulder pain    rotator cuff   Skin cancer    Skin cancer, PMH of, cell type unknown, Dr.  Shona    Past Surgical History:  Procedure Laterality Date   CATARACT EXTRACTION W/ INTRAOCULAR LENS IMPLANT Bilateral    colonoscopy with ploypectomy  03/05/2012    Dr Jakie, GI   CORONARY ANGIOPLASTY WITH STENT PLACEMENT     X 3 total stents   cystoscopic removal  04/04/2010   CYSTOSCOPY N/A 12/15/2023   Procedure: CYSTOSCOPY FLEXIBLE; URETHERAL DILATION; INSERTION OF FOLEY CATHETER;  Surgeon: Cam Morene ORN, MD;  Location: [facility];  Service: Urology;  Laterality: N/A;   Flexibe sigmoidoscopy   12/06/1999   LITHOTRIPSY     x 1   RECONSTRUCTION OF EYELID Left    TOTAL KNEE ARTHROPLASTY Left 12/15/2023   Procedure: LEFT TOTAL KNEE ARTHROPLASTY;  Surgeon: Vernetta Lonni GRADE, MD;  Location: [facility];  Service: Orthopedics;  Laterality: Left;    Current Medications: Current Meds  Medication Sig   Accu-Chek FastClix Lancets MISC 1 each by In Vitro route daily.   amLODipine  (NORVASC ) 10 MG tablet TAKE 1 TABLET BY MOUTH EVERY DAY   aspirin  81 MG chewable tablet Chew 1 tablet (81 mg total) by mouth 2 (two) times daily.   Coenzyme Q10 200 MG TABS Take 1 tablet by mouth daily.    glucose blood (ACCU-CHEK GUIDE)  test strip USE 1 STRIP ONCE DAILY   HYDROcodone -acetaminophen  (NORCO/VICODIN) 5-325 MG tablet Take 1 tablet by mouth every 6 (six) hours as needed for moderate pain (pain score 4-6).   lisinopril  (ZESTRIL ) 40 MG tablet TAKE 1 TABLET BY MOUTH EVERY DAY   metFORMIN  (GLUCOPHAGE ) 500 MG tablet TAKE 1 TABLET BY MOUTH TWICE A DAY   methocarbamol  (ROBAXIN ) 500 MG tablet Take 1 tablet (500 mg total) by mouth every 6 (six) hours as needed.   metoprolol  tartrate (LOPRESSOR ) 25 MG tablet TAKE 1 TABLET BY MOUTH TWICE A DAY   omeprazole (PRILOSEC) 20 MG capsule Take 20 mg by mouth daily as needed.   rosuvastatin  (CRESTOR ) 10 MG tablet TAKE 1 TABLET BY MOUTH EVERYDAY AT BEDTIME   sildenafil  (REVATIO ) 20 MG tablet TAKE 3 TO 5 TABLETS BY MOUTH DAILY AS DIRECTED   tamsulosin  (FLOMAX ) 0.4 MG  CAPS capsule TAKE 1 CAPSULE BY MOUTH EVERY DAY     Allergies:   Iodinated contrast media and Iohexol   Social History   Socioeconomic History   Marital status: Married    Spouse name: Not on file   Number of children: 0   Years of education: Not on file   Highest education level: Not on file  Occupational History   Occupation: Sports coach    Comment: Retired  Tobacco Use   Smoking status: Former    Current packs/day: 0.00    Types: Cigarettes    Quit date: 12/05/1970    Years since quitting: 53.8    Passive exposure: Never   Smokeless tobacco: Never   Tobacco comments:    smoked 1961-1972, up to 1 ppd  Vaping Use   Vaping status: Never Used  Substance and Sexual Activity   Alcohol use: Yes    Comment: minimally, 6 beers/year   Drug use: No   Sexual activity: Not on file  Other Topics Concern   Not on file  Social History Narrative   Has living will    Wife is health care POA-- no alternate set up (??niece or sister)   Would accept resuscitation attempts   No extended tube feedings if cognitively unaware   Social Drivers of Health   Financial Resource Strain: Not on file  Food Insecurity: No Food Insecurity (12/19/2023)   Hunger Vital Sign    Worried About Running Out of Food in the Last Year: Never true    Ran Out of Food in the Last Year: Never true  Transportation Needs: No Transportation Needs (12/19/2023)   PRAPARE - Administrator, Civil Service (Medical): No    Lack of Transportation (Non-Medical): No  Physical Activity: Not on file  Stress: Not on file  Social Connections: Socially Integrated (12/19/2023)   Social Connection and Isolation Panel    Frequency of Communication with Friends and Family: More than three times a week    Frequency of Social Gatherings with Friends and Family: More than three times a week    Attends Religious Services: More than 4 times per year    Active Member of [employer] or Organizations: Yes    Attends Museum/gallery Exhibitions Officer: More than 4 times per year    Marital Status: Married     Family History: The patient's family history includes Breast cancer in his mother and sister; Cervical cancer in his mother; Colon cancer in his sister; Diabetes in his father; Heart attack (age of onset: 54) in his father. There is no history of Stroke.  ROS:   Please see the history of present illness.     All other systems reviewed and are negative.  EKGs/Labs/Other Studies Reviewed:    The following studies were reviewed today: EKG Interpretation Date/Time:  Wednesday October 09 2024 09:39:32 EST Ventricular Rate:  74 PR Interval:  172 QRS Duration:  134 QT Interval:  426 QTC Calculation: 472 R Axis:   -19  Text Interpretation: Normal sinus rhythm Right bundle branch block Minimal voltage criteria for LVH, may be normal variant ( R in aVL ) When compared with ECG of 07-Dec-2023 14:38, No significant change was found Confirmed by Shondra Capps [phone]) on 10/09/2024 9:44:23 AM  Recent Labs: 05/23/2024: ALT 15; BUN 19; Creatinine, Ser 1.03; Hemoglobin 14.6; Platelets 167.0; Potassium 4.6; Sodium 140  Recent Lipid Panel    Component Value Date/Time   CHOL 120 05/23/2024 1449   TRIG 100.0 05/23/2024 1449   HDL 39.70 05/23/2024 1449   CHOLHDL 3 05/23/2024 1449   VLDL 20.0 05/23/2024 1449   LDLCALC 61 05/23/2024 1449   LDLDIRECT 66.2 09/16/2013 1553     Risk Assessment/Calculations:       Physical Exam:    VS:  BP (!) 148/70   Pulse 74   Ht 5' 9 (1.753 m)   Wt 192 lb (87.1 kg)   SpO2 97%   BMI 28.35 kg/m     Wt Readings from Last 3 Encounters:  10/09/24 192 lb (87.1 kg)  05/23/24 194 lb (88 kg)  12/15/23 196 lb 15.7 oz (89.4 kg)     GEN:  Well nourished, well developed in no acute distress HEENT: Normal NECK: No JVD; No carotid bruits LYMPHATICS: No lymphadenopathy CARDIAC: RRR, no murmurs, rubs, gallops RESPIRATORY:  Clear to auscultation without rales, wheezing or rhonchi   ABDOMEN: Soft, non-tender, non-distended MUSCULOSKELETAL:  No edema; No deformity  SKIN: Warm and dry NEUROLOGIC:  Alert and oriented x 3 PSYCHIATRIC:  Normal affect   ASSESSMENT:    1. Coronary artery disease involving native coronary artery of native heart with unstable angina pectoris (HCC)   2. Essential hypertension, benign   3. Type 2 diabetes mellitus without complication, without long-term current use of insulin  (HCC)   4. Hypercholesterolemia    PLAN:    In order of problems listed above:  CAD s/p prior BMS of proximal and distal RCA in 2010. S/p DES of proximal RCA in 2011 for restenosis. Now with accelerating anginal symptoms despite good medical therapy with statin, ASA, metoprolol  and amlodipine . Recommend cardiac cath with possible PCI. The procedure and risks were reviewed including but not limited to death, myocardial infarction, stroke, arrythmias, bleeding, transfusion, emergency surgery, dye allergy, or renal dysfunction. The patient voices understanding and is agreeable to proceed.  Will arrange for Nov 12.  DM on metformin . Last A1c 8%. May need to consider GLP 1 inhibitor HLD. On Crestor  at maximally tolerated dose. LDL 61 HTN on multiple meds.       Informed Consent   Shared Decision Making/Informed Consent The risks [stroke (1 in 1000), death (1 in 1000), kidney failure [usually temporary] (1 in 500), bleeding (1 in 200), allergic reaction [possibly serious] (1 in 200)], benefits (diagnostic support and management of coronary artery disease) and alternatives of a cardiac catheterization were discussed in detail with Mr. Nasca and he is willing to proceed.       Medication Adjustments/Labs and Tests Ordered: Current medicines are reviewed at length with the patient today.  Concerns regarding medicines are outlined above.  Orders Placed This Encounter  Procedures   EKG 12-Lead   No orders of the defined types were placed in this encounter.   Patient  Instructions  Medication Instructions:   *If you need a refill on your cardiac medications before your next appointment, please call your pharmacy*  Lab Work:   Testing/Procedures:   Follow-Up: At [facility], you and your health needs are our priority.  As part of our continuing mission to provide you with exceptional heart care, our providers are all part of one team.  This team includes your primary Cardiologist (physician) and Advanced Practice Providers or APPs (Physician Assistants and Nurse Practitioners) who all work together to provide you with the care you need, when you need it.  Your next appointment:      Provider:     We recommend signing up for the patient portal called MyChart.  Sign up information is provided on this After Visit Summary.  MyChart is used to connect with patients for Virtual Visits (Telemedicine).  Patients are able to view lab/test results, encounter notes, upcoming appointments, etc.  Non-urgent messages can be sent to your provider as well.   To learn more about what you can do with MyChart, go to [email or web address].      Signed, Sofiya Ezelle, MD  10/09/2024 10:09 AM    [facility]

## 2024-09-30 ENCOUNTER — Encounter: Payer: Self-pay | Admitting: Physical Therapy

## 2024-09-30 ENCOUNTER — Ambulatory Visit: Admitting: Physical Therapy

## 2024-09-30 DIAGNOSIS — M25611 Stiffness of right shoulder, not elsewhere classified: Secondary | ICD-10-CM

## 2024-09-30 DIAGNOSIS — M25511 Pain in right shoulder: Secondary | ICD-10-CM

## 2024-09-30 DIAGNOSIS — M6281 Muscle weakness (generalized): Secondary | ICD-10-CM

## 2024-09-30 NOTE — Therapy (Signed)
 OUTPATIENT PHYSICAL THERAPY SHOULDER TREATMENT   Patient Name: Peter Blair MRN: [id number] DOB:04-Jan-1941, 83 y.o., male Today's Date: 09/30/2024  END OF SESSION:  PT End of Session - 09/30/24 1146     Visit Number 2    Date for Recertification  12/09/24    PT Start Time 1145    PT Stop Time 1230    PT Time Calculation (min) 45 min    Activity Tolerance Patient tolerated treatment well    Behavior During Therapy [facility] for tasks assessed/performed          Past Medical History:  Diagnosis Date   Coronary artery disease    exertional chest pain, cath 12/10 showed EF 50-55% with mild inferior hypokensis, 99% proximal and 60% distal RCA stenosis, 80% prox stenosis of a moderate sized D1, 40% prox LAD. Patient had BMS x2 placed in RCA. Unstable Angina. PROMUS DES to the prox RCA   Diabetes mellitus without complication (HCC)    Erectile dysfunction    GERD (gastroesophageal reflux disease)    Hyperlipidemia    Hypertension    Nephrolithiasis    Dr. Ike   Renal cyst    Right shoulder pain    rotator cuff   Skin cancer    Skin cancer, PMH of, cell type unknown, Dr. Shona   Past Surgical History:  Procedure Laterality Date   CATARACT EXTRACTION W/ INTRAOCULAR LENS IMPLANT Bilateral    colonoscopy with ploypectomy  03/05/2012    Dr Jakie, GI   CORONARY ANGIOPLASTY WITH STENT PLACEMENT     X 3 total stents   cystoscopic removal  04/04/2010   CYSTOSCOPY N/A 12/15/2023   Procedure: CYSTOSCOPY FLEXIBLE; URETHERAL DILATION; INSERTION OF FOLEY CATHETER;  Surgeon: Cam Morene ORN, MD;  Location: [facility];  Service: Urology;  Laterality: N/A;   Flexibe sigmoidoscopy   12/06/1999   LITHOTRIPSY     x 1   RECONSTRUCTION OF EYELID Left    TOTAL KNEE ARTHROPLASTY Left 12/15/2023   Procedure: LEFT TOTAL KNEE ARTHROPLASTY;  Surgeon: Vernetta Lonni GRADE, MD;  Location: [facility];  Service: Orthopedics;  Laterality: Left;   Patient Active Problem List   Diagnosis Date Noted    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/15/2023   Unilateral primary osteoarthritis, right hip 09/25/2023   Actinic keratosis 10/17/2018   Type 2 diabetes mellitus with other circulatory complications (HCC) 12/11/2017   Osteoarthritis, multiple sites 10/11/2017   Preventative health care 08/05/2015   Advance directive discussed with patient 08/05/2015   GERD (gastroesophageal reflux disease)    Hyperplastic colonic polyp 12/17/2013   Benign prostatic hyperplasia with urinary obstruction 01/16/2013   ED (erectile dysfunction) of organic origin 01/16/2013   Coronary atherosclerosis of native coronary artery 11/20/2009   NEPHROLITHIASIS 09/29/2009   RENAL CYST, RIGHT 09/29/2009   Hyperlipemia 09/16/2008   SKIN CANCER, HX OF 09/16/2008   Essential hypertension, benign 07/09/2007    PCP: Charlie Denise  REFERRING PROVIDER: Kay Cummins  REFERRING DIAG:  [phone] (ICD-10-CM) - Chronic right shoulder pain    THERAPY DIAG:  Acute pain of right shoulder  Stiffness of right shoulder, not elsewhere classified  Muscle weakness (generalized)  Rationale for Evaluation and Treatment: Rehabilitation  ONSET DATE: 08/21/24 he reported about two month ago  SUBJECTIVE:  SUBJECTIVE STATEMENT:  Hurting pretty good hurts more if he does anything  They gave a cortisone shot and it helped for a while but it is back now and just a constant ache. I don't know what caused it but I think it is just wear and tear over the years. 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   Hand dominance: Right  PERTINENT HISTORY: 08/21/24--Peter Blair is an 83 year old male who presents with right shoulder pain.   He has experienced right shoulder pain for at least two months,  with occasional radiation to the neck, which has resolved. The pain varies in intensity and can disrupt sleep, described as similar to a 'toothache'. It worsens with arm elevation, reaching, and external rotation, and is alleviated by certain positions. He also notes weakness during specific movements.   There is no specific injury reported, but he attributes the pain to his active lifestyle, including working on cars and using a chainsaw. He has received cortisone injections for pain management, most recently in his hip eight weeks ago, and underwent joint replacement surgery in January.  PAIN:  Are you having pain? Yes: NPRS scale: 5/10 Pain location: R shoulder, sometimes radiates down arm to elbow Pain description: constant ache  Aggravating factors: lifting, reaching, carrying  Relieving factors: the cortisone shot did for a while, pain meds sometimes   PRECAUTIONS: None  RED FLAGS: None   WEIGHT BEARING RESTRICTIONS: No  FALLS:  Has patient fallen in last 6 months? No  LIVING ENVIRONMENT: Lives with: lives with their spouse Lives in: House/apartment Stairs: Yes: Internal: steps to go upstairs and to the basement steps; on right going up and External: 3 steps; on right going up  OCCUPATION: Retired- like to be out in the yard and fish, works on cars some   PLOF: Independent  PATIENT GOALS:just tried to get rid of some pain   NEXT MD VISIT:   OBJECTIVE:  Note: Objective measures were completed at Evaluation unless otherwise noted.  DIAGNOSTIC FINDINGS:  RADIOLOGY X-rays of the right shoulder show advanced arthritic changes of the [facility] joint. Minimal arthritic changes of the glenohumeral joint. Mild degenerative irregularity and spurring of the greater tuberosity.   PATIENT SURVEYS:  QuickDash 45.5/100  COGNITION: Overall cognitive status: Within functional limits for tasks assessed     SENSATION: WFL  POSTURE: Rounded shoulders   UPPER EXTREMITY ROM:   Active  ROM Right eval Left eval  Shoulder flexion 130 w/pain   Shoulder extension    Shoulder abduction 150 w/pain   Shoulder adduction    Shoulder internal rotation WFL   Shoulder external rotation WFL w/pain at end range   Elbow flexion    Elbow extension    Wrist flexion    Wrist extension    Wrist ulnar deviation    Wrist radial deviation    Wrist pronation    Wrist supination    (Blank rows = not tested)  UPPER EXTREMITY MMT:  MMT Right eval Left eval  Shoulder flexion 4- with pain   Shoulder extension    Shoulder abduction 4- with pain   Shoulder adduction    Shoulder internal rotation 5   Shoulder external rotation 3+ with pain   Middle trapezius    Lower trapezius    Elbow flexion    Elbow extension    Wrist flexion    Wrist extension    Wrist ulnar deviation  Wrist radial deviation    Wrist pronation    Wrist supination    Grip strength (lbs)    (Blank rows = not tested)  SHOULDER SPECIAL TESTS: Impingement tests: Neer impingement test: positive , Hawkins/Kennedy impingement test: negative, and Painful arc test: positive  Rotator cuff assessment: Empty can test: positive   JOINT MOBILITY TESTING:  Full PROM with pain   PALPATION:  Tenderness over right acromioclavicular joint. Pain on external rotation and resisted abduction of right shoulder. Weakness and pain on manual muscle testing of infraspinatus and supraspinatus.                                                                                                                            TREATMENT DATE:  09/30/24 NuStep L 5 x 6 min AAROM 3lb WaTE Flex, Ext, IR x10 Shoulder abd 2lb 2x10 RUE IR yellow 2x15 RUE ER yellow 2x10 Rows & Ext green 2x10 Shoulder Flex 2lb 2x10 Triceps Ext 20lb 2x15   09/16/24 EVALUATION   PATIENT EDUCATION: Education details: POC, HEP, shoulder anatomy  Person educated: Patient Education method: Explanation and Demonstration Education comprehension: verbalized  understanding and returned demonstration  HOME EXERCISE PROGRAM: Access Code: [email or web address] URL: [email or web address] Date: 09/16/2024 Prepared by: Almetta Fam  Exercises - Standing Shoulder Row with Anchored Resistance  - 1 x daily - 7 x weekly - 2 sets - 10 reps - Shoulder extension with resistance - Neutral  - 1 x daily - 7 x weekly - 2 sets - 10 reps - Shoulder External Rotation and Scapular Retraction with Resistance  - 1 x daily - 7 x weekly - 2 sets - 10 reps (use yellow band)  - Standing Shoulder Horizontal Abduction with Resistance  - 1 x daily - 7 x weekly - 2 sets - 10 reps - Doorway Pec Stretch at 90 Degrees Abduction  - 1 x daily - 7 x weekly - 2 reps - 15 hold  ASSESSMENT:  CLINICAL IMPRESSION: Patient is a 83 y.o. male who was seen today for physical therapy treatment for R shoulder pain. 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 He may also have some impingement.  Patient presents with some ROM deficits and pain with resisted motions.  RUE weakness  with flexion and abduction as reps progressed,  weakness with ER noted  well. Tactile cues to RUE needed to keep RUE to side with ER. Patient will benefit from PT to address his pain in order to improve ROM and strength to increase function with R shoulder with ADLs, household chores, and hobbies.   OBJECTIVE IMPAIRMENTS: decreased ROM, decreased strength, impaired UE functional use, and pain.   ACTIVITY LIMITATIONS: carrying, lifting, and reach over head  PARTICIPATION LIMITATIONS: cleaning, laundry, and yard work  PERSONAL FACTORS: Age and Time since onset of injury/illness/exacerbation are also affecting patient's functional outcome.   REHAB POTENTIAL: Good  CLINICAL DECISION MAKING: Stable/uncomplicated  EVALUATION COMPLEXITY: Low   GOALS: Goals  reviewed with patient? Yes  SHORT TERM GOALS: Target date: 10/28/24  Patient will be independent with initial HEP.  Baseline:  Goal  status: INITIAL   LONG TERM GOALS: Target date: 12/10/23  Patient will be independent with advanced/ongoing HEP to improve outcomes and carryover.  Baseline:  Goal status: INITIAL  2.  Patient will report 75% improvement in R shoulder pain to improve QOL.  Baseline:  Goal status: INITIAL  3.  Patient to improve R shoulder AROM to WNL without pain provocation to allow for increased ease of ADLs.  Baseline:  Goal status: INITIAL  4.  Patient will demonstrate improved functional UE strength as demonstrated by 5/5 in all mm groups. Baseline: see chart above Goal status: INITIAL  5.  Patient will report 10 points improvement on QuickDash to demonstrate improved functional ability.  Baseline: 45.5 Goal status: INITIAL  PLAN:  PT FREQUENCY: 1-2x/week  PT DURATION: 12 weeks  PLANNED INTERVENTIONS: 97110-Therapeutic exercises, 97530- Therapeutic activity, [id number]- Neuromuscular re-education, 97535- Self Care, [id number]- Manual therapy, 97035- Ultrasound, 02966- Ionotophoresis 4mg /ml Dexamethasone , Patient/Family education, Taping, Joint mobilization, Cryotherapy, and Moist heat  PLAN FOR NEXT SESSION: R shoulder strengthening, esp with ER, postural strengthening, modalities as needed for pain    Tanda KANDICE Sorrow, PTA 09/30/2024, 11:46 AM

## 2024-10-02 ENCOUNTER — Ambulatory Visit: Admitting: Physical Therapy

## 2024-10-02 ENCOUNTER — Encounter: Payer: Self-pay | Admitting: Physical Therapy

## 2024-10-02 DIAGNOSIS — M25611 Stiffness of right shoulder, not elsewhere classified: Secondary | ICD-10-CM

## 2024-10-02 DIAGNOSIS — M6281 Muscle weakness (generalized): Secondary | ICD-10-CM

## 2024-10-02 DIAGNOSIS — M25511 Pain in right shoulder: Secondary | ICD-10-CM | POA: Diagnosis not present

## 2024-10-02 NOTE — Therapy (Addendum)
 OUTPATIENT PHYSICAL THERAPY SHOULDER TREATMENT   Patient Name: Peter Blair MRN: [id number] DOB:1940/12/09, 83 y.o., male Today's Date: 10/02/2024  END OF SESSION:  PT End of Session - 10/02/24 1427     Visit Number 3    Date for Recertification  12/09/24    PT Start Time 1430    PT Stop Time 1515    PT Time Calculation (min) 45 min    Activity Tolerance Patient tolerated treatment well    Behavior During Therapy [facility] for tasks assessed/performed          Past Medical History:  Diagnosis Date   Coronary artery disease    exertional chest pain, cath 12/10 showed EF 50-55% with mild inferior hypokensis, 99% proximal and 60% distal RCA stenosis, 80% prox stenosis of a moderate sized D1, 40% prox LAD. Patient had BMS x2 placed in RCA. Unstable Angina. PROMUS DES to the prox RCA   Diabetes mellitus without complication (HCC)    Erectile dysfunction    GERD (gastroesophageal reflux disease)    Hyperlipidemia    Hypertension    Nephrolithiasis    Dr. Ike   Renal cyst    Right shoulder pain    rotator cuff   Skin cancer    Skin cancer, PMH of, cell type unknown, Dr. Shona   Past Surgical History:  Procedure Laterality Date   CATARACT EXTRACTION W/ INTRAOCULAR LENS IMPLANT Bilateral    colonoscopy with ploypectomy  03/05/2012    Dr Jakie, GI   CORONARY ANGIOPLASTY WITH STENT PLACEMENT     X 3 total stents   cystoscopic removal  04/04/2010   CYSTOSCOPY N/A 12/15/2023   Procedure: CYSTOSCOPY FLEXIBLE; URETHERAL DILATION; INSERTION OF FOLEY CATHETER;  Surgeon: Cam Morene ORN, MD;  Location: [facility];  Service: Urology;  Laterality: N/A;   Flexibe sigmoidoscopy   12/06/1999   LITHOTRIPSY     x 1   RECONSTRUCTION OF EYELID Left    TOTAL KNEE ARTHROPLASTY Left 12/15/2023   Procedure: LEFT TOTAL KNEE ARTHROPLASTY;  Surgeon: Vernetta Lonni GRADE, MD;  Location: [facility];  Service: Orthopedics;  Laterality: Left;   Patient Active Problem List   Diagnosis Date Noted    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/15/2023   Unilateral primary osteoarthritis, right hip 09/25/2023   Actinic keratosis 10/17/2018   Type 2 diabetes mellitus with other circulatory complications (HCC) 12/11/2017   Osteoarthritis, multiple sites 10/11/2017   Preventative health care 08/05/2015   Advance directive discussed with patient 08/05/2015   GERD (gastroesophageal reflux disease)    Hyperplastic colonic polyp 12/17/2013   Benign prostatic hyperplasia with urinary obstruction 01/16/2013   ED (erectile dysfunction) of organic origin 01/16/2013   Coronary atherosclerosis of native coronary artery 11/20/2009   NEPHROLITHIASIS 09/29/2009   RENAL CYST, RIGHT 09/29/2009   Hyperlipemia 09/16/2008   SKIN CANCER, HX OF 09/16/2008   Essential hypertension, benign 07/09/2007    PCP: Charlie Denise  REFERRING PROVIDER: Kay Cummins  REFERRING DIAG:  [phone] (ICD-10-CM) - Chronic right shoulder pain    THERAPY DIAG:  Acute pain of right shoulder  Muscle weakness (generalized)  Stiffness of right shoulder, not elsewhere classified  Rationale for Evaluation and Treatment: Rehabilitation  ONSET DATE: 08/21/24 he reported about two month ago  SUBJECTIVE:  SUBJECTIVE STATEMENT: About the same  R Hip hurts more than shoulder   They gave a cortisone shot and it helped for a while but it is back now and just a constant ache. I don't know what caused it but I think it is just wear and tear over the years. 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   Hand dominance: Right  PERTINENT HISTORY: 08/21/24--Peter Blair is an 83 year old male who presents with right shoulder pain.   He has experienced right shoulder pain for at least two months, with  occasional radiation to the neck, which has resolved. The pain varies in intensity and can disrupt sleep, described as similar to a 'toothache'. It worsens with arm elevation, reaching, and external rotation, and is alleviated by certain positions. He also notes weakness during specific movements.   There is no specific injury reported, but he attributes the pain to his active lifestyle, including working on cars and using a chainsaw. He has received cortisone injections for pain management, most recently in his hip eight weeks ago, and underwent joint replacement surgery in January.  PAIN:  Are you having pain? Yes: NPRS scale: 5/10 Pain location: R shoulder, sometimes radiates down arm to elbow Pain description: constant ache  Aggravating factors: lifting, reaching, carrying  Relieving factors: the cortisone shot did for a while, pain meds sometimes   PRECAUTIONS: None  RED FLAGS: None   WEIGHT BEARING RESTRICTIONS: No  FALLS:  Has patient fallen in last 6 months? No  LIVING ENVIRONMENT: Lives with: lives with their spouse Lives in: House/apartment Stairs: Yes: Internal: steps to go upstairs and to the basement steps; on right going up and External: 3 steps; on right going up  OCCUPATION: Retired- like to be out in the yard and fish, works on cars some   PLOF: Independent  PATIENT GOALS:just tried to get rid of some pain   NEXT MD VISIT:   OBJECTIVE:  Note: Objective measures were completed at Evaluation unless otherwise noted.  DIAGNOSTIC FINDINGS:  RADIOLOGY X-rays of the right shoulder show advanced arthritic changes of the [facility] joint. Minimal arthritic changes of the glenohumeral joint. Mild degenerative irregularity and spurring of the greater tuberosity.   PATIENT SURVEYS:  QuickDash 45.5/100  COGNITION: Overall cognitive status: Within functional limits for tasks assessed     SENSATION: WFL  POSTURE: Rounded shoulders   UPPER EXTREMITY ROM:   Active ROM  Right eval Left eval  Shoulder flexion 130 w/pain   Shoulder extension    Shoulder abduction 150 w/pain   Shoulder adduction    Shoulder internal rotation WFL   Shoulder external rotation WFL w/pain at end range   Elbow flexion    Elbow extension    Wrist flexion    Wrist extension    Wrist ulnar deviation    Wrist radial deviation    Wrist pronation    Wrist supination    (Blank rows = not tested)  UPPER EXTREMITY MMT:  MMT Right eval Left eval  Shoulder flexion 4- with pain   Shoulder extension    Shoulder abduction 4- with pain   Shoulder adduction    Shoulder internal rotation 5   Shoulder external rotation 3+ with pain   Middle trapezius    Lower trapezius    Elbow flexion    Elbow extension    Wrist flexion    Wrist extension    Wrist ulnar deviation  Wrist radial deviation    Wrist pronation    Wrist supination    Grip strength (lbs)    (Blank rows = not tested)  SHOULDER SPECIAL TESTS: Impingement tests: Neer impingement test: positive , Hawkins/Kennedy impingement test: negative, and Painful arc test: positive  Rotator cuff assessment: Empty can test: positive   JOINT MOBILITY TESTING:  Full PROM with pain   PALPATION:  Tenderness over right acromioclavicular joint. Pain on external rotation and resisted abduction of right shoulder. Weakness and pain on manual muscle testing of infraspinatus and supraspinatus.                                                                                                                            TREATMENT DATE:  10/02/24 NuStep L 5 x 7 min Rows & lats 25lb 2x10  Shoulder Ext 10lb 2x10 Standing Rows 10lb 2x10  Horizontal shoulder abduction 2x10 red  RUE ER/UR red 2x10 Shoulder Flex & abd 2lb 2x10   09/30/24 NuStep L 5 x 6 min AAROM 3lb WaTE Flex, Ext, IR x10 Shoulder abd 2lb 2x10 RUE IR yellow 2x15 RUE ER yellow 2x10 Rows & Ext green 2x10 Shoulder Flex 2lb 2x10 Triceps Ext 20lb  2x15   09/16/24 EVALUATION   PATIENT EDUCATION: Education details: POC, HEP, shoulder anatomy  Person educated: Patient Education method: Explanation and Demonstration Education comprehension: verbalized understanding and returned demonstration  HOME EXERCISE PROGRAM: Access Code: [email or web address] URL: [email or web address] Date: 09/16/2024 Prepared by: Almetta Fam  Exercises - Standing Shoulder Row with Anchored Resistance  - 1 x daily - 7 x weekly - 2 sets - 10 reps - Shoulder extension with resistance - Neutral  - 1 x daily - 7 x weekly - 2 sets - 10 reps - Shoulder External Rotation and Scapular Retraction with Resistance  - 1 x daily - 7 x weekly - 2 sets - 10 reps (use yellow band)  - Standing Shoulder Horizontal Abduction with Resistance  - 1 x daily - 7 x weekly - 2 sets - 10 reps - Doorway Pec Stretch at 90 Degrees Abduction  - 1 x daily - 7 x weekly - 2 reps - 15 hold  ASSESSMENT:  CLINICAL IMPRESSION: Patient is a 83 y.o. male who was seen today for physical therapy treatment for R shoulder pain. 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 He may also have some impingement.  RUE weakness and pain with flexion and abduction remains. Postural compensation present with ER due to weakness.  Tactile cues to RUE needed to keep RUE to side with ER. Patient will benefit from PT to address his pain in order to improve ROM and strength to increase function with R shoulder with ADLs, household chores, and hobbies.   OBJECTIVE IMPAIRMENTS: decreased ROM, decreased strength, impaired UE functional use, and pain.   ACTIVITY LIMITATIONS: carrying, lifting, and reach over head  PARTICIPATION LIMITATIONS: cleaning, laundry, and yard work  PERSONAL FACTORS: Age and  Time since onset of injury/illness/exacerbation are also affecting patient's functional outcome.   REHAB POTENTIAL: Good  CLINICAL DECISION MAKING: Stable/uncomplicated  EVALUATION  COMPLEXITY: Low   GOALS: Goals reviewed with patient? Yes  SHORT TERM GOALS: Target date: 10/28/24  Patient will be independent with initial HEP.  Baseline:  Goal status: Progressing 10/02/24   LONG TERM GOALS: Target date: 12/10/23  Patient will be independent with advanced/ongoing HEP to improve outcomes and carryover.  Baseline:  Goal status: INITIAL  2.  Patient will report 75% improvement in R shoulder pain to improve QOL.  Baseline:  Goal status: INITIAL  3.  Patient to improve R shoulder AROM to WNL without pain provocation to allow for increased ease of ADLs.  Baseline:  Goal status: INITIAL  4.  Patient will demonstrate improved functional UE strength as demonstrated by 5/5 in all mm groups. Baseline: see chart above Goal status: INITIAL  5.  Patient will report 10 points improvement on QuickDash to demonstrate improved functional ability.  Baseline: 45.5 Goal status: INITIAL  PLAN:  PT FREQUENCY: 1-2x/week  PT DURATION: 12 weeks  PLANNED INTERVENTIONS: 97110-Therapeutic exercises, 97530- Therapeutic activity, [id number]- Neuromuscular re-education, 97535- Self Care, 02859- Manual therapy, 97035- Ultrasound, 02966- Ionotophoresis 4mg /ml Dexamethasone , Patient/Family education, Taping, Joint mobilization, Cryotherapy, and Moist heat  PLAN FOR NEXT SESSION: R shoulder strengthening, esp with ER, postural strengthening, modalities as needed for pain    Tanda KANDICE Sorrow, PTA 10/02/2024, 2:28 PM

## 2024-10-07 ENCOUNTER — Ambulatory Visit: Attending: Orthopaedic Surgery

## 2024-10-07 ENCOUNTER — Encounter: Payer: Self-pay | Admitting: Radiology

## 2024-10-07 DIAGNOSIS — M6281 Muscle weakness (generalized): Secondary | ICD-10-CM | POA: Diagnosis present

## 2024-10-07 DIAGNOSIS — M25511 Pain in right shoulder: Secondary | ICD-10-CM | POA: Insufficient documentation

## 2024-10-07 DIAGNOSIS — M25611 Stiffness of right shoulder, not elsewhere classified: Secondary | ICD-10-CM | POA: Insufficient documentation

## 2024-10-07 DIAGNOSIS — R262 Difficulty in walking, not elsewhere classified: Secondary | ICD-10-CM | POA: Diagnosis present

## 2024-10-07 NOTE — Therapy (Signed)
 OUTPATIENT PHYSICAL THERAPY SHOULDER TREATMENT   Patient Name: Peter Blair MRN: [id number] DOB:1941-03-23, 83 y.o., male Today's Date: 10/07/2024  END OF SESSION:  PT End of Session - 10/07/24 1657     PT Stop Time --          Past Medical History:  Diagnosis Date   Coronary artery disease    exertional chest pain, cath 12/10 showed EF 50-55% with mild inferior hypokensis, 99% proximal and 60% distal RCA stenosis, 80% prox stenosis of a moderate sized D1, 40% prox LAD. Patient had BMS x2 placed in RCA. Unstable Angina. PROMUS DES to the prox RCA   Diabetes mellitus without complication (HCC)    Erectile dysfunction    GERD (gastroesophageal reflux disease)    Hyperlipidemia    Hypertension    Nephrolithiasis    Dr. Ike   Renal cyst    Right shoulder pain    rotator cuff   Skin cancer    Skin cancer, PMH of, cell type unknown, Dr. Shona   Past Surgical History:  Procedure Laterality Date   CATARACT EXTRACTION W/ INTRAOCULAR LENS IMPLANT Bilateral    colonoscopy with ploypectomy  03/05/2012    Dr Peter Blair, GI   CORONARY ANGIOPLASTY WITH STENT PLACEMENT     X 3 total stents   cystoscopic removal  04/04/2010   CYSTOSCOPY N/A 12/15/2023   Procedure: CYSTOSCOPY FLEXIBLE; URETHERAL DILATION; INSERTION OF FOLEY CATHETER;  Surgeon: Peter Morene ORN, MD;  Location: [facility];  Service: Urology;  Laterality: N/A;   Flexibe sigmoidoscopy   12/06/1999   LITHOTRIPSY     x 1   RECONSTRUCTION OF EYELID Left    TOTAL KNEE ARTHROPLASTY Left 12/15/2023   Procedure: LEFT TOTAL KNEE ARTHROPLASTY;  Surgeon: Peter Lonni GRADE, MD;  Location: [facility];  Service: Orthopedics;  Laterality: Left;   Patient Active Problem List   Diagnosis Date Noted   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/15/2023   Unilateral primary osteoarthritis, right hip 09/25/2023   Actinic keratosis 10/17/2018   Type 2 diabetes mellitus with other circulatory complications (HCC) 12/11/2017    Osteoarthritis, multiple sites 10/11/2017   Preventative health care 08/05/2015   Advance directive discussed with patient 08/05/2015   GERD (gastroesophageal reflux disease)    Hyperplastic colonic polyp 12/17/2013   Benign prostatic hyperplasia with urinary obstruction 01/16/2013   ED (erectile dysfunction) of organic origin 01/16/2013   Coronary atherosclerosis of native coronary artery 11/20/2009   NEPHROLITHIASIS 09/29/2009   RENAL CYST, RIGHT 09/29/2009   Hyperlipemia 09/16/2008   SKIN CANCER, HX OF 09/16/2008   Essential hypertension, benign 07/09/2007    PCP: Peter Blair  REFERRING PROVIDER: Kay Blair  REFERRING DIAG:  [phone] (ICD-10-CM) - Chronic right shoulder pain    THERAPY DIAG:  Acute pain of right shoulder  Difficulty in walking, not elsewhere classified  Muscle weakness (generalized)  Stiffness of right shoulder, not elsewhere classified  Rationale for Evaluation and Treatment: Rehabilitation  ONSET DATE: 08/21/24 he reported about two month ago  SUBJECTIVE:  SUBJECTIVE STATEMENT: About the same  R Hip hurts more than shoulder. Pt reported 6/10 hip pain. Pt reported interest in another cortisone shot for his hip pain.   Hand dominance: Right  PERTINENT HISTORY: 08/21/24--Peter Blair is an 83 year old male who presents with right shoulder pain.   He has experienced right shoulder pain for at least two months, with occasional radiation to the neck, which has resolved. The pain varies in intensity and can disrupt sleep, described as similar to a 'toothache'. It worsens with arm elevation, reaching, and external rotation, and is alleviated by certain positions. He also notes weakness during specific movements.   There is no specific injury reported, but he  attributes the pain to his active lifestyle, including working on cars and using a chainsaw. He has received cortisone injections for pain management, most recently in his hip eight weeks ago, and underwent joint replacement surgery in January.  PAIN:  Are you having pain? Yes: NPRS scale: 6/10 Pain location: R shoulder, sometimes radiates down arm to elbow Pain description: constant ache  Aggravating factors: lifting, reaching, carrying  Relieving factors: the cortisone shot did for a while, pain meds sometimes  Pain location: R hip   PRECAUTIONS: None  RED FLAGS: None   WEIGHT BEARING RESTRICTIONS: No  FALLS:  Has patient fallen in last 6 months? No  LIVING ENVIRONMENT: Lives with: lives with their spouse Lives in: House/apartment Stairs: Yes: Internal: steps to go upstairs and to the basement steps; on right going up and External: 3 steps; on right going up  OCCUPATION: Retired- like to be out in the yard and fish, works on cars some   PLOF: Independent  PATIENT GOALS:just tried to get rid of some pain   NEXT MD VISIT:   OBJECTIVE:  Note: Objective measures were completed at Evaluation unless otherwise noted.  DIAGNOSTIC FINDINGS:  RADIOLOGY X-rays of the right shoulder show advanced arthritic changes of the [facility] joint. Minimal arthritic changes of the glenohumeral joint. Mild degenerative irregularity and spurring of the greater tuberosity.   PATIENT SURVEYS:  QuickDash 45.5/100  COGNITION: Overall cognitive status: Within functional limits for tasks assessed     SENSATION: WFL  POSTURE: Rounded shoulders   UPPER EXTREMITY ROM:   Active ROM Right eval Left eval  Shoulder flexion 130 w/pain   Shoulder extension    Shoulder abduction 150 w/pain   Shoulder adduction    Shoulder internal rotation WFL   Shoulder external rotation WFL w/pain at end range   Elbow flexion    Elbow extension    Wrist flexion    Wrist extension    Wrist ulnar deviation     Wrist radial deviation    Wrist pronation    Wrist supination    (Blank rows = not tested)  UPPER EXTREMITY MMT:  MMT Right eval Left eval  Shoulder flexion 4- with pain   Shoulder extension    Shoulder abduction 4- with pain   Shoulder adduction    Shoulder internal rotation 5   Shoulder external rotation 3+ with pain   Middle trapezius    Lower trapezius    Elbow flexion    Elbow extension    Wrist flexion    Wrist extension    Wrist ulnar deviation    Wrist radial deviation    Wrist pronation    Wrist supination    Grip strength (lbs)    (Blank rows = not tested)  SHOULDER SPECIAL TESTS: Impingement tests: Neer impingement  test: positive , Hawkins/Kennedy impingement test: negative, and Painful arc test: positive  Rotator cuff assessment: Empty can test: positive   JOINT MOBILITY TESTING:  Full PROM with pain   PALPATION:  Tenderness over right acromioclavicular joint. Pain on external rotation and resisted abduction of right shoulder. Weakness and pain on manual muscle testing of infraspinatus and supraspinatus.                                                                                                                            TREATMENT DATE:  10/07/24 NuStep L5x50min Shoulder ER yellow band 2x10 Standing hip abduction 3x10 Hip adduction with ball 3x10 Seated marches 3x20 STS with overhead press  Toe taps 4 Cable lat pulldowns 10#    10/02/24 NuStep L 5 x 7 min Rows & lats 25lb 2x10  Shoulder Ext 10lb 2x10 Standing Rows 10lb 2x10  Horizontal shoulder abduction 2x10 red  RUE ER/UR red 2x10 Shoulder Flex & abd 2lb 2x10   09/30/24 NuStep L 5 x 6 min AAROM 3lb WaTE Flex, Ext, IR x10 Shoulder abd 2lb 2x10 RUE IR yellow 2x15 RUE ER yellow 2x10 Rows & Ext green 2x10 Shoulder Flex 2lb 2x10 Triceps Ext 20lb 2x15   09/16/24 EVALUATION   PATIENT EDUCATION: Education details: POC, HEP, shoulder anatomy  Person educated: Patient Education  method: Explanation and Demonstration Education comprehension: verbalized understanding and returned demonstration  HOME EXERCISE PROGRAM: Access Code: [email or web address] URL: [email or web address] Date: 09/16/2024 Prepared by: Almetta Fam  Exercises - Standing Shoulder Row with Anchored Resistance  - 1 x daily - 7 x weekly - 2 sets - 10 reps - Shoulder extension with resistance - Neutral  - 1 x daily - 7 x weekly - 2 sets - 10 reps - Shoulder External Rotation and Scapular Retraction with Resistance  - 1 x daily - 7 x weekly - 2 sets - 10 reps (use yellow band)  - Standing Shoulder Horizontal Abduction with Resistance  - 1 x daily - 7 x weekly - 2 sets - 10 reps - Doorway Pec Stretch at 90 Degrees Abduction  - 1 x daily - 7 x weekly - 2 reps - 15 hold  ASSESSMENT:  CLINICAL IMPRESSION: Patient is a 83 y.o. male who was seen today for physical therapy treatment for R shoulder pain. Pt presents with continued increase in pain with ER shoulder movements. Pt experiencing some R hip pain today with increased irritation with prolonged hip flexion. Worked on mostly low level interventions to avoid increasing pain levels. Patient will benefit from PT to address his pain in order to improve ROM and strength to increase function with R shoulder with ADLs, household chores, and hobbies.   OBJECTIVE IMPAIRMENTS: decreased ROM, decreased strength, impaired UE functional use, and pain.   ACTIVITY LIMITATIONS: carrying, lifting, and reach over head  PARTICIPATION LIMITATIONS: cleaning, laundry, and yard work  PERSONAL FACTORS: Age and Time since onset of injury/illness/exacerbation are also affecting patient's functional outcome.  REHAB POTENTIAL: Good  CLINICAL DECISION MAKING: Stable/uncomplicated  EVALUATION COMPLEXITY: Low   GOALS: Goals reviewed with patient? Yes  SHORT TERM GOALS: Target date: 10/28/24  Patient will be independent with initial HEP.  Baseline:  Goal status:  Progressing 10/02/24   LONG TERM GOALS: Target date: 12/10/23  Patient will be independent with advanced/ongoing HEP to improve outcomes and carryover.  Baseline:  Goal status: INITIAL  2.  Patient will report 75% improvement in R shoulder pain to improve QOL.  Baseline:  Goal status: IN PROGRESS 10/07/24  3.  Patient to improve R shoulder AROM to WNL without pain provocation to allow for increased ease of ADLs.  Baseline:  Goal status: INITIAL  4.  Patient will demonstrate improved functional UE strength as demonstrated by 5/5 in all mm groups. Baseline: see chart above Goal status: INITIAL  5.  Patient will report 10 points improvement on QuickDash to demonstrate improved functional ability.  Baseline: 45.5 Goal status: INITIAL  PLAN:  PT FREQUENCY: 1-2x/week  PT DURATION: 12 weeks  PLANNED INTERVENTIONS: 97110-Therapeutic exercises, 97530- Therapeutic activity, [id number]- Neuromuscular re-education, 97535- Self Care, [id number]- Manual therapy, 97035- Ultrasound, 02966- Ionotophoresis 4mg /ml Dexamethasone , Patient/Family education, Taping, Joint mobilization, Cryotherapy, and Moist heat  PLAN FOR NEXT SESSION: R shoulder strengthening, esp with ER, postural strengthening, modalities as needed for pain    Thersia Alder, Student-PT 10/07/2024, 5:29 PM

## 2024-10-09 ENCOUNTER — Other Ambulatory Visit: Payer: Self-pay

## 2024-10-09 ENCOUNTER — Ambulatory Visit: Attending: Cardiology | Admitting: Cardiology

## 2024-10-09 ENCOUNTER — Encounter: Payer: Self-pay | Admitting: Cardiology

## 2024-10-09 ENCOUNTER — Other Ambulatory Visit: Payer: Self-pay | Admitting: Cardiology

## 2024-10-09 VITALS — BP 148/70 | HR 74 | Ht 69.0 in | Wt 192.0 lb

## 2024-10-09 DIAGNOSIS — E119 Type 2 diabetes mellitus without complications: Secondary | ICD-10-CM | POA: Diagnosis not present

## 2024-10-09 DIAGNOSIS — I10 Essential (primary) hypertension: Secondary | ICD-10-CM

## 2024-10-09 DIAGNOSIS — I25110 Atherosclerotic heart disease of native coronary artery with unstable angina pectoris: Secondary | ICD-10-CM | POA: Diagnosis not present

## 2024-10-09 DIAGNOSIS — I200 Unstable angina: Secondary | ICD-10-CM

## 2024-10-09 DIAGNOSIS — E7800 Pure hypercholesterolemia, unspecified: Secondary | ICD-10-CM | POA: Diagnosis not present

## 2024-10-09 LAB — CBC WITH DIFFERENTIAL/PLATELET

## 2024-10-09 MED ORDER — NITROGLYCERIN 0.4 MG SL SUBL: 0.4000 mg | SUBLINGUAL_TABLET | SUBLINGUAL | 6 refills | Status: AC | PRN

## 2024-10-09 NOTE — Patient Instructions (Addendum)
 Medication Instructions:  Continue same medications *If you need a refill on your cardiac medications before your next appointment, please call your pharmacy*  Lab Work: Bmet,cbc today   Testing/Procedures: Cardiac Cath scheduled at [facility] hospital Wed 11/12 Arrive at 11:00 am  Follow directions below   Follow-Up: At [facility], you and your health needs are our priority.  As part of our continuing mission to provide you with exceptional heart care, our providers are all part of one team.  This team includes your primary Cardiologist (physician) and Advanced Practice Providers or APPs (Physician Assistants and Nurse Practitioners) who all work together to provide you with the care you need, when you need it.  Your next appointment:  After Cath    Provider:  Dr.Jordan     [facility] A DEPT OF [place]. [facility] AT [address] A DEPT OF THE [place]. [facility] [address] [place] [address] Dept: [phone] Loc: [phone]  DAEKWON BESWICK  10/09/2024  You are scheduled for a Cardiac Catheterization on Wednesday, November 12 with Dr. Peter Jordan.  1. Please arrive at the [facility] (Main Entrance A) at [facility]: [address] [place], [place] [address] at 11:00 AM (This time is 2 hour(s) before your procedure to ensure your preparation).   Free valet parking service is available. You will check in at ADMITTING. The support person will be asked to wait in the waiting room.  It is OK to have someone drop you off and come back when you are ready to be discharged.    Special note: Every effort is made to have your procedure done on time. Please understand that emergencies sometimes delay scheduled procedures.  2. Diet: Nothing to eat after midnight.   3. Hydration: You need to be well hydrated before your procedure. On November 12, you may drink approved liquids (see below) until 2 hours before the procedure, with  16 oz of water as your last intake.   List of approved liquids water, clear juice, clear tea, black coffee, fruit juices, non-citric and without pulp, carbonated beverages, Gatorade, Kool -Aid, plain Jello-O and plain ice popsicles.  4. Labs: You will need to have blood drawn on Wednesday, November 05 at [facility] (1st Floor), [address] [place], [place] [address]. You do not need to be fasting.  5. Medication instructions in preparation for your procedure:     Hold Metformin  morning of and Hold 2 days after     On the morning of your procedure, take your Aspirin  81 mg and any morning medicines NOT listed above.  You may use sips of water.  6. Plan to go home the same day, you will only stay overnight if medically necessary. 7. Bring a current list of your medications and current insurance cards. 8. You MUST have a responsible person to drive you home. 9. Someone MUST be with you the first 24 hours after you arrive home or your discharge will be delayed. 10. Please wear clothes that are easy to get on and off and wear slip-on shoes.  Thank you for allowing us  to care for you!   -- [facility]  We recommend signing up for the patient portal called MyChart.  Sign up information is provided on this After Visit Summary.  MyChart is used to connect with patients for Virtual Visits (Telemedicine).  Patients are  able to view lab/test results, encounter notes, upcoming appointments, etc.  Non-urgent messages can be sent to your provider as well.   To learn more about what you can do with MyChart, go to [email or web address].

## 2024-10-10 ENCOUNTER — Ambulatory Visit: Admitting: Physical Therapy

## 2024-10-10 ENCOUNTER — Encounter: Payer: Self-pay | Admitting: Physical Therapy

## 2024-10-10 ENCOUNTER — Ambulatory Visit: Payer: Self-pay | Admitting: Cardiology

## 2024-10-10 DIAGNOSIS — M6281 Muscle weakness (generalized): Secondary | ICD-10-CM

## 2024-10-10 DIAGNOSIS — M25611 Stiffness of right shoulder, not elsewhere classified: Secondary | ICD-10-CM

## 2024-10-10 DIAGNOSIS — M25511 Pain in right shoulder: Secondary | ICD-10-CM | POA: Diagnosis not present

## 2024-10-10 LAB — CBC WITH DIFFERENTIAL/PLATELET
Basos: 0 %
EOS (ABSOLUTE): 0 x10E3/uL (ref 0.0–0.2)
Eos: 1 %
Hematocrit: 46.6 % (ref 37.5–51.0)
Hemoglobin: 15.1 g/dL (ref 13.0–17.7)
Immature Granulocytes: 0 %
Immature Granulocytes: 0 x10E3/uL (ref 0.0–0.1)
Lymphs: 17 %
MCH: 30.8 pg (ref 26.6–33.0)
MCHC: 32.4 g/dL (ref 31.5–35.7)
MCV: 95 fL (ref 79–97)
Monocytes Absolute: 0.1 x10E3/uL (ref 0.0–0.4)
Monocytes Absolute: 0.6 x10E3/uL (ref 0.1–0.9)
Monocytes: 8 %
Neutrophils Absolute: 1.2 x10E3/uL (ref 0.7–3.1)
Neutrophils Absolute: 5.2 x10E3/uL (ref 1.4–7.0)
Neutrophils: 73 %
Platelets: 184 x10E3/uL (ref 150–450)
RBC: 4.9 x10E6/uL (ref 4.14–5.80)
RDW: 12.4 % (ref 11.6–15.4)
WBC: 7.1 x10E3/uL (ref 3.4–10.8)

## 2024-10-10 LAB — BASIC METABOLIC PANEL WITH GFR
BUN/Creatinine Ratio: 14 (ref 10–24)
BUN: 16 mg/dL (ref 8–27)
CO2: 20 mmol/L (ref 20–29)
Calcium: 9.8 mg/dL (ref 8.6–10.2)
Chloride: 103 mmol/L (ref 96–106)
Creatinine, Ser: 1.11 mg/dL (ref 0.76–1.27)
Glucose: 147 mg/dL — ABNORMAL HIGH (ref 70–99)
Potassium: 4.2 mmol/L (ref 3.5–5.2)
Sodium: 139 mmol/L (ref 134–144)
eGFR: 66 mL/min/1.73 (ref >59)

## 2024-10-10 NOTE — Therapy (Signed)
 OUTPATIENT PHYSICAL THERAPY SHOULDER TREATMENT   Patient Name: Peter Blair MRN: [id number] DOB:Jul 02, 1941, 83 y.o., male Today's Date: 10/10/2024  END OF SESSION:  PT End of Session - 10/10/24 1426     Visit Number 5    Date for Recertification  12/09/24    PT Start Time 1425    PT Stop Time 1510    PT Time Calculation (min) 45 min    Activity Tolerance Patient tolerated treatment well    Behavior During Therapy [facility] for tasks assessed/performed          Past Medical History:  Diagnosis Date   Coronary artery disease    exertional chest pain, cath 12/10 showed EF 50-55% with mild inferior hypokensis, 99% proximal and 60% distal RCA stenosis, 80% prox stenosis of a moderate sized D1, 40% prox LAD. Patient had BMS x2 placed in RCA. Unstable Angina. PROMUS DES to the prox RCA   Diabetes mellitus without complication (HCC)    Erectile dysfunction    GERD (gastroesophageal reflux disease)    Hyperlipidemia    Hypertension    Nephrolithiasis    Dr. Ike   Renal cyst    Right shoulder pain    rotator cuff   Skin cancer    Skin cancer, PMH of, cell type unknown, Dr. Shona   Past Surgical History:  Procedure Laterality Date   CATARACT EXTRACTION W/ INTRAOCULAR LENS IMPLANT Bilateral    colonoscopy with ploypectomy  03/05/2012    Dr Jakie, GI   CORONARY ANGIOPLASTY WITH STENT PLACEMENT     X 3 total stents   cystoscopic removal  04/04/2010   CYSTOSCOPY N/A 12/15/2023   Procedure: CYSTOSCOPY FLEXIBLE; URETHERAL DILATION; INSERTION OF FOLEY CATHETER;  Surgeon: Cam Morene ORN, MD;  Location: [facility];  Service: Urology;  Laterality: N/A;   Flexibe sigmoidoscopy   12/06/1999   LITHOTRIPSY     x 1   RECONSTRUCTION OF EYELID Left    TOTAL KNEE ARTHROPLASTY Left 12/15/2023   Procedure: LEFT TOTAL KNEE ARTHROPLASTY;  Surgeon: Vernetta Lonni GRADE, MD;  Location: [facility];  Service: Orthopedics;  Laterality: Left;   Patient Active Problem List   Diagnosis Date Noted    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/15/2023   Unilateral primary osteoarthritis, right hip 09/25/2023   Actinic keratosis 10/17/2018   Type 2 diabetes mellitus with other circulatory complications (HCC) 12/11/2017   Osteoarthritis, multiple sites 10/11/2017   Preventative health care 08/05/2015   Advance directive discussed with patient 08/05/2015   GERD (gastroesophageal reflux disease)    Hyperplastic colonic polyp 12/17/2013   Benign prostatic hyperplasia with urinary obstruction 01/16/2013   ED (erectile dysfunction) of organic origin 01/16/2013   Coronary atherosclerosis of native coronary artery 11/20/2009   NEPHROLITHIASIS 09/29/2009   RENAL CYST, RIGHT 09/29/2009   Hyperlipemia 09/16/2008   SKIN CANCER, HX OF 09/16/2008   Essential hypertension, benign 07/09/2007    PCP: Charlie Denise  REFERRING PROVIDER: Kay Cummins  REFERRING DIAG:  [id number] (ICD-10-CM) - Chronic right shoulder pain    THERAPY DIAG:  No diagnosis found.  Rationale for Evaluation and Treatment: Rehabilitation  ONSET DATE: 08/21/24 he reported about two month ago  SUBJECTIVE:  SUBJECTIVE STATEMENT: Not too good Hip is hurting more than his arm  Hand dominance: Right  PERTINENT HISTORY: 08/21/24--[name] is an 83 year old male who presents with right shoulder pain.   He has experienced right shoulder pain for at least two months, with occasional radiation to the neck, which has resolved. The pain varies in intensity and can disrupt sleep, described as similar to a 'toothache'. It worsens with arm elevation, reaching, and external rotation, and is alleviated by certain positions. He also notes weakness during specific movements.   There is no specific injury reported, but he attributes the pain to his  active lifestyle, including working on cars and using a chainsaw. He has received cortisone injections for pain management, most recently in his hip eight weeks ago, and underwent joint replacement surgery in January.  PAIN:  Are you having pain? Yes: NPRS scale: 5/10 Pain location: R hip Pain description: constant ache  Aggravating factors: lifting, reaching, carrying  Relieving factors: the cortisone shot did for a while, pain meds sometimes  Pain location: R hip   PRECAUTIONS: None  RED FLAGS: None   WEIGHT BEARING RESTRICTIONS: No  FALLS:  Has patient fallen in last 6 months? No  LIVING ENVIRONMENT: Lives with: lives with their spouse Lives in: House/apartment Stairs: Yes: Internal: steps to go upstairs and to the basement steps; on right going up and External: 3 steps; on right going up  OCCUPATION: Retired- like to be out in the yard and fish, works on cars some   PLOF: Independent  PATIENT GOALS:just tried to get rid of some pain   NEXT MD VISIT:   OBJECTIVE:  Note: Objective measures were completed at Evaluation unless otherwise noted.  DIAGNOSTIC FINDINGS:  RADIOLOGY X-rays of the right shoulder show advanced arthritic changes of the [facility] joint. Minimal arthritic changes of the glenohumeral joint. Mild degenerative irregularity and spurring of the greater tuberosity.   PATIENT SURVEYS:  QuickDash 45.5/100  COGNITION: Overall cognitive status: Within functional limits for tasks assessed     SENSATION: WFL  POSTURE: Rounded shoulders   UPPER EXTREMITY ROM:   Active ROM Right eval Right 10/10/24  Shoulder flexion 130 w/pain 150  Shoulder extension    Shoulder abduction 150 w/pain 150  Shoulder adduction    Shoulder internal rotation WFL EFL  Shoulder external rotation WFL w/pain at end range   Elbow flexion    Elbow extension    Wrist flexion    Wrist extension    Wrist ulnar deviation    Wrist radial deviation    Wrist pronation    Wrist  supination    (Blank rows = not tested)  UPPER EXTREMITY MMT:  MMT Right eval Right 10/10/24  Shoulder flexion 4- with pain 4+  Shoulder extension    Shoulder abduction 4- with pain 4+  Shoulder adduction    Shoulder internal rotation 5 5  Shoulder external rotation 3+ with pain 4  Middle trapezius    Lower trapezius    Elbow flexion    Elbow extension    Wrist flexion    Wrist extension    Wrist ulnar deviation    Wrist radial deviation    Wrist pronation    Wrist supination    Grip strength (lbs)    (Blank rows = not tested)  SHOULDER SPECIAL TESTS: Impingement tests: Neer impingement test: positive , Hawkins/Kennedy impingement test: negative, and Painful arc test: positive  Rotator cuff assessment: Empty can test: positive   JOINT  MOBILITY TESTING:  Full PROM with pain   PALPATION:  Tenderness over right acromioclavicular joint. Pain on external rotation and resisted abduction of right shoulder. Weakness and pain on manual muscle testing of infraspinatus and supraspinatus.                                                                                                                            TREATMENT DATE:  10/10/24 NuStep L 5 x 7 min Goals Shoulder abd 3lb 2x10 Shoulder abd 2lb 2x10 RUE yellow ER/IR 2x10 each Rows & lats 35lb 2x10  Chest press 5lb 2x10 Shoulder Ext 10lb 2x10 Triceps Ext 25lb 2x15   10/07/24 NuStep L5x57min Shoulder ER yellow band 2x10 Standing hip abduction 3x10 Hip adduction with ball 3x10 Seated marches 3x20 STS with overhead press  Toe taps 4 Cable lat pulldowns 10#    10/02/24 NuStep L 5 x 7 min Rows & lats 25lb 2x10  Shoulder Ext 10lb 2x10 Standing Rows 10lb 2x10  Horizontal shoulder abduction 2x10 red  RUE ER/UR red 2x10 Shoulder Flex & abd 2lb 2x10   09/30/24 NuStep L 5 x 6 min AAROM 3lb WaTE Flex, Ext, IR x10 Shoulder abd 2lb 2x10 RUE IR yellow 2x15 RUE ER yellow 2x10 Rows & Ext green 2x10 Shoulder Flex 2lb  2x10 Triceps Ext 20lb 2x15   09/16/24 EVALUATION   PATIENT EDUCATION: Education details: POC, HEP, shoulder anatomy  Person educated: Patient Education method: Explanation and Demonstration Education comprehension: verbalized understanding and returned demonstration  HOME EXERCISE PROGRAM: Access Code: [email or web address] URL: [email or web address] Date: 09/16/2024 Prepared by: Almetta Fam  Exercises - Standing Shoulder Row with Anchored Resistance  - 1 x daily - 7 x weekly - 2 sets - 10 reps - Shoulder extension with resistance - Neutral  - 1 x daily - 7 x weekly - 2 sets - 10 reps - Shoulder External Rotation and Scapular Retraction with Resistance  - 1 x daily - 7 x weekly - 2 sets - 10 reps (use yellow band)  - Standing Shoulder Horizontal Abduction with Resistance  - 1 x daily - 7 x weekly - 2 sets - 10 reps - Doorway Pec Stretch at 90 Degrees Abduction  - 1 x daily - 7 x weekly - 2 reps - 15 hold  ASSESSMENT:  CLINICAL IMPRESSION: Patient is a 83 y.o. male who was seen today for physical therapy treatment for R shoulder pain. Today he reports more R hip pain than shoulder pain. He will be having a surgical  procedure next week and would like to be placed on hold. Today he has progressed increasing hip RUE AROM and strength. Increase resistance tolerated with seated rows and lats. Tactiel cue needed to keep UE down with triceps Ext  Patient will benefit from PT to address his pain in order to improve ROM and strength to increase function with R shoulder with ADLs, household chores, and hobbies.   OBJECTIVE IMPAIRMENTS: decreased ROM, decreased strength, impaired  UE functional use, and pain.   ACTIVITY LIMITATIONS: carrying, lifting, and reach over head  PARTICIPATION LIMITATIONS: cleaning, laundry, and yard work  PERSONAL FACTORS: Age and Time since onset of injury/illness/exacerbation are also affecting patient's functional outcome.   REHAB POTENTIAL: Good  CLINICAL  DECISION MAKING: Stable/uncomplicated  EVALUATION COMPLEXITY: Low   GOALS: Goals reviewed with patient? Yes  SHORT TERM GOALS: Target date: 10/28/24  Patient will be independent with initial HEP.  Baseline:  Goal status: Progressing 10/02/24   LONG TERM GOALS: Target date: 12/10/23  Patient will be independent with advanced/ongoing HEP to improve outcomes and carryover.  Baseline:  Goal status: INITIAL  2.  Patient will report 75% improvement in R shoulder pain to improve QOL.  Baseline:  Goal status: IN PROGRESS 10/07/24, ongoing 10/10/24  3.  Patient to improve R shoulder AROM to WNL without pain provocation to allow for increased ease of ADLs.  Baseline:  Goal status: Partly Met 10/10/24  4.  Patient will demonstrate improved functional UE strength as demonstrated by 5/5 in all mm groups. Baseline: see chart above Goal status: Progressing 10/10/24  5.  Patient will report 10 points improvement on QuickDash to demonstrate improved functional ability.  Baseline: 45.5 Goal status: INITIAL  PLAN:  PT FREQUENCY: 1-2x/week  PT DURATION: 12 weeks  PLANNED INTERVENTIONS: 97110-Therapeutic exercises, 97530- Therapeutic activity, [id number]- Neuromuscular re-education, 97535- Self Care, 02859- Manual therapy, 97035- Ultrasound, 02966- Ionotophoresis 4mg /ml Dexamethasone , Patient/Family education, Taping, Joint mobilization, Cryotherapy, and Moist heat  PLAN FOR NEXT SESSION: Will place pt on 2 week hold   Tanda KANDICE Sorrow, PTA 10/10/2024, 2:27 PM

## 2024-10-15 ENCOUNTER — Other Ambulatory Visit: Payer: Self-pay

## 2024-10-15 ENCOUNTER — Telehealth: Payer: Self-pay

## 2024-10-15 ENCOUNTER — Telehealth: Payer: Self-pay | Admitting: *Deleted

## 2024-10-15 MED ORDER — DIPHENHYDRAMINE HCL 50 MG PO TABS: ORAL_TABLET | ORAL | 0 refills | Status: DC

## 2024-10-15 MED ORDER — PREDNISONE 50 MG PO TABS: ORAL_TABLET | ORAL | 0 refills | Status: DC

## 2024-10-15 NOTE — Telephone Encounter (Signed)
 Cardiac Catheterization scheduled at [facility] for: Wednesday October 16, 2024 1 PM Arrival time [facility] Main Entrance A at: 11 AM  Diet: -May have light meal until 7 AM. (6 hours before procedure time) Approved light meal consists of plain toast, fruit, light soups, crackers.  Hydration: -May drink clear liquids until 2 hours before the procedure.  Approved liquids: Water, clear tea, black coffee, fruit juices-non-citric and without pulp,Gatorade, plain Jello/popsicles.   -Please drink 16 oz of water 2 hours before procedure.  CONTRAST ALLERGY: 13 hour Prednisone  and Benadryl  Prep: 10/16/24 Prednisone  50 mg 12 AM (midnight) 10/16/24 Prednisone  50 mg 6 AM 10/16/24 Prednisone  50 mg and Benadryl  50 mg just prior to leaving home for hospital Instructed not to drive after taking Benadryl   Medication instructions: -Hold:  Metformin -day of procedure and 48 hours after procedure -Other usual morning medications can be taken including aspirin  81 mg.  Plan to go home the same day, you will only stay overnight if medically necessary.  You must have responsible adult to drive you home.  Someone must be with you the first 24 hours after you arrive home.  Reviewed procedure instructions with patient's wife (DPR), Dickey.

## 2024-10-15 NOTE — Telephone Encounter (Signed)
 Spoke to patient advised he will need to take Prednisone  50 mg 13 hours before cath 12:00 am and 50 mg 7 hours before cath 6:00 am and 50 mg with Benadryl  50 mg 2 hours before cath.Prescription sent to your [facility] on [address].

## 2024-10-16 ENCOUNTER — Encounter (HOSPITAL_COMMUNITY): Payer: Self-pay | Admitting: Cardiology

## 2024-10-16 ENCOUNTER — Other Ambulatory Visit: Payer: Self-pay

## 2024-10-16 ENCOUNTER — Ambulatory Visit (HOSPITAL_COMMUNITY)
Admission: RE | Admit: 2024-10-16 | Discharge: 2024-10-17 | Disposition: A | Discharge status: Home / Self Care | Attending: Cardiology | Admitting: Cardiology

## 2024-10-16 ENCOUNTER — Encounter (HOSPITAL_COMMUNITY)
Admission: RE | Disposition: A | Discharge status: Home / Self Care | Payer: Self-pay | Source: Home / Self Care | Attending: Cardiology

## 2024-10-16 DIAGNOSIS — Z79899 Other long term (current) drug therapy: Secondary | ICD-10-CM | POA: Insufficient documentation

## 2024-10-16 DIAGNOSIS — E1159 Type 2 diabetes mellitus with other circulatory complications: Secondary | ICD-10-CM | POA: Insufficient documentation

## 2024-10-16 DIAGNOSIS — Z87891 Personal history of nicotine dependence: Secondary | ICD-10-CM | POA: Diagnosis not present

## 2024-10-16 DIAGNOSIS — Z955 Presence of coronary angioplasty implant and graft: Secondary | ICD-10-CM | POA: Insufficient documentation

## 2024-10-16 DIAGNOSIS — Z7982 Long term (current) use of aspirin: Secondary | ICD-10-CM | POA: Insufficient documentation

## 2024-10-16 DIAGNOSIS — I10 Essential (primary) hypertension: Secondary | ICD-10-CM | POA: Insufficient documentation

## 2024-10-16 DIAGNOSIS — E7800 Pure hypercholesterolemia, unspecified: Secondary | ICD-10-CM | POA: Diagnosis not present

## 2024-10-16 DIAGNOSIS — Z7902 Long term (current) use of antithrombotics/antiplatelets: Secondary | ICD-10-CM | POA: Insufficient documentation

## 2024-10-16 DIAGNOSIS — Z7984 Long term (current) use of oral hypoglycemic drugs: Secondary | ICD-10-CM | POA: Insufficient documentation

## 2024-10-16 DIAGNOSIS — I200 Unstable angina: Secondary | ICD-10-CM | POA: Diagnosis present

## 2024-10-16 DIAGNOSIS — I25110 Atherosclerotic heart disease of native coronary artery with unstable angina pectoris: Secondary | ICD-10-CM | POA: Diagnosis not present

## 2024-10-16 DIAGNOSIS — E785 Hyperlipidemia, unspecified: Secondary | ICD-10-CM | POA: Diagnosis present

## 2024-10-16 HISTORY — PX: LEFT HEART CATH AND CORONARY ANGIOGRAPHY: CATH118249

## 2024-10-16 HISTORY — PX: CORONARY STENT INTERVENTION: CATH118234

## 2024-10-16 LAB — CBC
HCT: 41.2 % (ref 39.0–52.0)
Hemoglobin: 14.4 g/dL (ref 13.0–17.0)
MCH: 31.4 pg (ref 26.0–34.0)
MCHC: 35 g/dL (ref 30.0–36.0)
MCV: 89.8 fL (ref 80.0–100.0)
Platelets: 171 10*3/uL (ref 150–400)
RBC: 4.59 MIL/uL (ref 4.22–5.81)
RDW: 12.6 % (ref 11.5–15.5)
WBC: 8.5 10*3/uL (ref 4.0–10.5)
nRBC: 0 % (ref 0.0–0.2)

## 2024-10-16 LAB — GLUCOSE, CAPILLARY: Glucose-Capillary: 263 mg/dL — ABNORMAL HIGH (ref 70–99)

## 2024-10-16 LAB — CREATININE, SERUM
Creatinine, Ser: 0.97 mg/dL (ref 0.61–1.24)
GFR, Estimated: >60 mL/min

## 2024-10-16 MED ORDER — LIDOCAINE HCL (PF) 1 % IJ SOLN
INTRAMUSCULAR | Status: AC
  Filled 2024-10-16: qty 30

## 2024-10-16 MED ORDER — SODIUM CHLORIDE 0.9% FLUSH
3.0000 mL | Freq: Two times a day (BID) | INTRAVENOUS | Status: DC
  Administered 2024-10-16 – 2024-10-17 (×2): 3 mL via INTRAVENOUS

## 2024-10-16 MED ORDER — HEPARIN SODIUM (PORCINE) 1000 UNIT/ML IJ SOLN
INTRAMUSCULAR | Status: DC | PRN
  Administered 2024-10-16: 3000 [IU] via INTRAVENOUS
  Administered 2024-10-16 (×2): 4500 [IU] via INTRAVENOUS

## 2024-10-16 MED ORDER — ONDANSETRON HCL 4 MG/2ML IJ SOLN: 4.0000 mg | Freq: Four times a day (QID) | INTRAMUSCULAR | Status: DC | PRN

## 2024-10-16 MED ORDER — TAMSULOSIN HCL 0.4 MG PO CAPS
0.4000 mg | ORAL_CAPSULE | Freq: Every day | ORAL | Status: DC
  Filled 2024-10-16 (×2): qty 1

## 2024-10-16 MED ORDER — FREE WATER
500.0000 mL | Freq: Once | Status: AC
  Administered 2024-10-16: 500 mL via ORAL

## 2024-10-16 MED ORDER — ENOXAPARIN SODIUM 40 MG/0.4ML IJ SOSY
40.0000 mg | PREFILLED_SYRINGE | INTRAMUSCULAR | Status: DC
  Administered 2024-10-17: 40 mg via SUBCUTANEOUS
  Filled 2024-10-16: qty 0.4

## 2024-10-16 MED ORDER — HEPARIN (PORCINE) IN NACL 1000-0.9 UT/500ML-% IV SOLN
INTRAVENOUS | Status: DC | PRN
  Administered 2024-10-16 (×3): 500 mL

## 2024-10-16 MED ORDER — LISINOPRIL 20 MG PO TABS
40.0000 mg | ORAL_TABLET | Freq: Every day | ORAL | Status: DC
  Administered 2024-10-17: 40 mg via ORAL
  Filled 2024-10-16: qty 2

## 2024-10-16 MED ORDER — FENTANYL CITRATE (PF) 100 MCG/2ML IJ SOLN
INTRAMUSCULAR | Status: AC
  Filled 2024-10-16: qty 2

## 2024-10-16 MED ORDER — SODIUM CHLORIDE 0.9 % IV SOLN: 250.0000 mL | INTRAVENOUS | Status: DC | PRN

## 2024-10-16 MED ORDER — MIDAZOLAM HCL (PF) 2 MG/2ML IJ SOLN
INTRAMUSCULAR | Status: DC | PRN
  Administered 2024-10-16: 1 mg via INTRAVENOUS

## 2024-10-16 MED ORDER — SODIUM CHLORIDE 0.9% FLUSH: 3.0000 mL | Freq: Two times a day (BID) | INTRAVENOUS | Status: DC

## 2024-10-16 MED ORDER — HEPARIN SODIUM (PORCINE) 1000 UNIT/ML IJ SOLN
INTRAMUSCULAR | Status: AC
  Filled 2024-10-16: qty 10

## 2024-10-16 MED ORDER — FENTANYL CITRATE (PF) 100 MCG/2ML IJ SOLN
INTRAMUSCULAR | Status: DC | PRN
  Administered 2024-10-16: 25 ug via INTRAVENOUS

## 2024-10-16 MED ORDER — VERAPAMIL HCL 2.5 MG/ML IV SOLN
INTRAVENOUS | Status: DC | PRN
  Administered 2024-10-16: 10 mL via INTRA_ARTERIAL

## 2024-10-16 MED ORDER — METOPROLOL TARTRATE 25 MG PO TABS
25.0000 mg | ORAL_TABLET | Freq: Two times a day (BID) | ORAL | Status: DC
  Administered 2024-10-16 – 2024-10-17 (×2): 25 mg via ORAL
  Filled 2024-10-16 (×2): qty 1

## 2024-10-16 MED ORDER — CLOPIDOGREL BISULFATE 300 MG PO TABS
ORAL_TABLET | ORAL | Status: DC | PRN
  Administered 2024-10-16: 600 mg via ORAL

## 2024-10-16 MED ORDER — SODIUM CHLORIDE 0.9% FLUSH: 3.0000 mL | INTRAVENOUS | Status: DC | PRN

## 2024-10-16 MED ORDER — VERAPAMIL HCL 2.5 MG/ML IV SOLN
INTRAVENOUS | Status: AC
  Filled 2024-10-16: qty 2

## 2024-10-16 MED ORDER — MIDAZOLAM HCL 2 MG/2ML IJ SOLN
INTRAMUSCULAR | Status: AC
  Filled 2024-10-16: qty 2

## 2024-10-16 MED ORDER — CLOPIDOGREL BISULFATE 75 MG PO TABS
75.0000 mg | ORAL_TABLET | Freq: Every day | ORAL | Status: DC
  Administered 2024-10-17: 75 mg via ORAL
  Filled 2024-10-16: qty 1

## 2024-10-16 MED ORDER — SODIUM CHLORIDE 0.9 % IV SOLN
INTRAVENOUS | Status: DC | PRN
  Administered 2024-10-16: 10 mL/h via INTRAVENOUS

## 2024-10-16 MED ORDER — ASPIRIN 81 MG PO CHEW: 81.0000 mg | CHEWABLE_TABLET | ORAL | Status: DC

## 2024-10-16 MED ORDER — AMLODIPINE BESYLATE 5 MG PO TABS
10.0000 mg | ORAL_TABLET | Freq: Every day | ORAL | Status: DC
  Administered 2024-10-17: 10 mg via ORAL
  Filled 2024-10-16: qty 2

## 2024-10-16 MED ORDER — ROSUVASTATIN CALCIUM 5 MG PO TABS
5.0000 mg | ORAL_TABLET | Freq: Every day | ORAL | Status: DC
  Administered 2024-10-16 – 2024-10-17 (×2): 5 mg via ORAL
  Filled 2024-10-16 (×2): qty 1

## 2024-10-16 MED ORDER — NITROGLYCERIN 0.4 MG SL SUBL: 0.4000 mg | SUBLINGUAL_TABLET | SUBLINGUAL | Status: DC | PRN

## 2024-10-16 MED ORDER — FREE WATER: 500.0000 mL | Freq: Once | Status: DC

## 2024-10-16 MED ORDER — PANTOPRAZOLE SODIUM 40 MG PO TBEC
40.0000 mg | DELAYED_RELEASE_TABLET | Freq: Every day | ORAL | Status: DC
  Administered 2024-10-16 – 2024-10-17 (×2): 40 mg via ORAL
  Filled 2024-10-16 (×2): qty 1

## 2024-10-16 MED ORDER — CLOPIDOGREL BISULFATE 300 MG PO TABS
ORAL_TABLET | ORAL | Status: AC
  Filled 2024-10-16: qty 2

## 2024-10-16 MED ORDER — HYDRALAZINE HCL 20 MG/ML IJ SOLN: 10.0000 mg | INTRAMUSCULAR | Status: AC | PRN

## 2024-10-16 MED ORDER — ASPIRIN 81 MG PO CHEW
81.0000 mg | CHEWABLE_TABLET | Freq: Every day | ORAL | Status: DC
  Administered 2024-10-17: 81 mg via ORAL
  Filled 2024-10-16: qty 1

## 2024-10-16 MED ORDER — IOHEXOL 350 MG/ML SOLN
INTRAVENOUS | Status: DC | PRN
  Administered 2024-10-16: 150 mL

## 2024-10-16 MED ORDER — ACETAMINOPHEN 325 MG PO TABS
650.0000 mg | ORAL_TABLET | ORAL | Status: DC | PRN
  Administered 2024-10-16 – 2024-10-17 (×2): 650 mg via ORAL
  Filled 2024-10-16 (×2): qty 2

## 2024-10-16 MED ORDER — LIDOCAINE HCL (PF) 1 % IJ SOLN
INTRAMUSCULAR | Status: DC | PRN
  Administered 2024-10-16: 2 mL

## 2024-10-16 NOTE — Plan of Care (Signed)
  Problem: Activity: Goal: Ability to return to baseline activity level will improve Outcome: Progressing   Problem: Education: Goal: Knowledge of General Education information will improve Description: Including pain rating scale, medication(s)/side effects and non-pharmacologic comfort measures Outcome: Progressing   Problem: Clinical Measurements: Goal: Will remain free from infection Outcome: Progressing   Problem: Activity: Goal: Risk for activity intolerance will decrease Outcome: Progressing   Problem: Coping: Goal: Level of anxiety will decrease Outcome: Progressing

## 2024-10-16 NOTE — Progress Notes (Signed)
 Removed TR Band with no complications and added a transparent dressing.

## 2024-10-16 NOTE — Interval H&P Note (Signed)
 History and Physical Interval Note:  10/16/2024 12:28 PM  Peter Blair  has presented today for surgery, with the diagnosis of chest pain.  The various methods of treatment have been discussed with the patient and family. After consideration of risks, benefits and other options for treatment, the patient has consented to  Procedure(s): LEFT HEART CATH AND CORONARY ANGIOGRAPHY (N/A) as a surgical intervention.  The patient's history has been reviewed, patient examined, no change in status, stable for surgery.  I have reviewed the patient's chart and labs.  Questions were answered to the patient's satisfaction.   Cath Lab Visit (complete for each Cath Lab visit)  Clinical Evaluation Leading to the Procedure:   ACS: No.  Non-ACS:    Anginal Classification: CCS III  Anti-ischemic medical therapy: Maximal Therapy (2 or more classes of medications)  Non-Invasive Test Results: No non-invasive testing performed  Prior CABG: No previous CABG        [facility] 10/16/2024 12:28 PM

## 2024-10-17 ENCOUNTER — Other Ambulatory Visit (HOSPITAL_COMMUNITY): Payer: Self-pay

## 2024-10-17 ENCOUNTER — Encounter (HOSPITAL_COMMUNITY): Payer: Self-pay | Admitting: Cardiology

## 2024-10-17 DIAGNOSIS — Z955 Presence of coronary angioplasty implant and graft: Secondary | ICD-10-CM | POA: Diagnosis not present

## 2024-10-17 DIAGNOSIS — I200 Unstable angina: Secondary | ICD-10-CM

## 2024-10-17 DIAGNOSIS — I25110 Atherosclerotic heart disease of native coronary artery with unstable angina pectoris: Secondary | ICD-10-CM | POA: Diagnosis not present

## 2024-10-17 DIAGNOSIS — I10 Essential (primary) hypertension: Secondary | ICD-10-CM | POA: Diagnosis not present

## 2024-10-17 DIAGNOSIS — E1159 Type 2 diabetes mellitus with other circulatory complications: Secondary | ICD-10-CM | POA: Diagnosis not present

## 2024-10-17 LAB — CBC
HCT: 36.9 % — ABNORMAL LOW (ref 39.0–52.0)
Hemoglobin: 12.8 g/dL — ABNORMAL LOW (ref 13.0–17.0)
MCH: 31.7 pg (ref 26.0–34.0)
MCHC: 34.7 g/dL (ref 30.0–36.0)
MCV: 91.3 fL (ref 80.0–100.0)
Platelets: 155 K/uL (ref 150–400)
RBC: 4.04 MIL/uL — ABNORMAL LOW (ref 4.22–5.81)
RDW: 12.7 % (ref 11.5–15.5)
WBC: 8.2 K/uL (ref 4.0–10.5)
nRBC: 0 % (ref 0.0–0.2)

## 2024-10-17 LAB — BASIC METABOLIC PANEL WITH GFR
Anion gap: 9 (ref 5–15)
BUN: 20 mg/dL (ref 8–23)
CO2: 22 mmol/L (ref 22–32)
Calcium: 9.2 mg/dL (ref 8.9–10.3)
Chloride: 104 mmol/L (ref 98–111)
Creatinine, Ser: 1.24 mg/dL (ref 0.61–1.24)
GFR, Estimated: 58 mL/min — ABNORMAL LOW (ref >60)
Glucose, Bld: 248 mg/dL — ABNORMAL HIGH (ref 70–99)
Potassium: 4.3 mmol/L (ref 3.5–5.1)
Sodium: 135 mmol/L (ref 135–145)

## 2024-10-17 LAB — POCT ACTIVATED CLOTTING TIME
Activated Clotting Time: 233 s
Activated Clotting Time: 360 s

## 2024-10-17 MED ORDER — PANTOPRAZOLE SODIUM 40 MG PO TBEC
40.0000 mg | DELAYED_RELEASE_TABLET | Freq: Every day | ORAL | 1 refills | Status: AC
  Filled 2024-10-17: qty 90, 90d supply, fill #0

## 2024-10-17 MED ORDER — CLOPIDOGREL BISULFATE 75 MG PO TABS
75.0000 mg | ORAL_TABLET | Freq: Every day | ORAL | 1 refills | Status: AC
  Filled 2024-10-17: qty 90, 90d supply, fill #0

## 2024-10-17 NOTE — Progress Notes (Signed)
 Rounding Note   Patient Name: Peter Blair Date of Encounter: 10/17/2024  [facility] Cardiologist: None   Subjective  No chest pain. No events overnight.   Scheduled Meds:  amLODipine   10 mg Oral Daily   aspirin   81 mg Oral Daily   clopidogrel  75 mg Oral Q breakfast   enoxaparin (LOVENOX) injection  40 mg Subcutaneous Q24H   lisinopril   40 mg Oral Daily   metoprolol  tartrate  25 mg Oral BID   pantoprazole   40 mg Oral Daily   rosuvastatin   5 mg Oral Daily   sodium chloride  flush  3 mL Intravenous Q12H   tamsulosin   0.4 mg Oral Daily   Continuous Infusions:  sodium chloride      PRN Meds: sodium chloride , acetaminophen , nitroGLYCERIN, ondansetron  (ZOFRAN ) IV, sodium chloride  flush   Vital Signs  Vitals:   10/16/24 2000 10/16/24 2143 10/16/24 2305 10/17/24 0335  BP: 127/65 133/73 135/82 (!) 111/55  Pulse: 85 78 78 66  Resp:   20 17  Temp:   98.3 F (36.8 C) 97.6 F (36.4 C)  TempSrc:   Oral Oral  SpO2: 92% 94% 94% 96%  Weight:      Height:        Intake/Output Summary (Last 24 hours) at 10/17/2024 0739 Last data filed at 10/16/2024 1919 Gross per 24 hour  Intake 620 ml  Output 300 ml  Net 320 ml      10/16/2024    4:21 PM 10/16/2024   11:52 AM 10/09/2024    9:33 AM  Last 3 Weights  Weight (lbs) 187 lb 192 lb 192 lb  Weight (kg) 84.823 kg 87.091 kg 87.091 kg      Telemetry Sinus - Personally Reviewed  ECG  No am EKG - Personally Reviewed  Physical Exam  GEN: No acute distress.   Neck: No JVD Cardiac: RRR, no murmurs, rubs, or gallops.  Respiratory: Clear to auscultation bilaterally. GI: Soft, nontender, non-distended  MS: No edema; No deformity. Neuro:  Nonfocal  Psych: Normal affect   Labs High Sensitivity Troponin:  No results for input(s): TROPONINIHS in the last 720 hours.   Chemistry Recent Labs  Lab 10/16/24 1732 10/17/24 0204  NA  --  135  K  --  4.3  CL  --  104  CO2  --  22  GLUCOSE  --  248*  BUN  --  20   CREATININE 0.97 1.24  CALCIUM   --  9.2  GFRNONAA >60 58*  ANIONGAP  --  9    Lipids No results for input(s): CHOL, TRIG, HDL, LABVLDL, LDLCALC, CHOLHDL in the last 168 hours.  Hematology Recent Labs  Lab 10/16/24 1732 10/17/24 0204  WBC 8.5 8.2  RBC 4.59 4.04*  HGB 14.4 12.8*  HCT 41.2 36.9*  MCV 89.8 91.3  MCH 31.4 31.7  MCHC 35.0 34.7  RDW 12.6 12.7  PLT 171 155   Thyroid No results for input(s): TSH, FREET4 in the last 168 hours.  BNPNo results for input(s): BNP, PROBNP in the last 168 hours.  DDimer No results for input(s): DDIMER in the last 168 hours.   Radiology  CARDIAC CATHETERIZATION Result Date: 10/16/2024   2nd Diag lesion is 90% stenosed.   Prox RCA to Mid RCA lesion is 95% stenosed.   Mid RCA lesion is 35% stenosed.   Previously placed Ost RCA to Prox RCA stent of unknown type is  widely patent.   Previously placed Dist RCA stent of  unknown type is  widely patent.   A drug-eluting stent was successfully placed using a STENT ONYX FRONTIER [id number].   Post intervention, there is a 0% residual stenosis.   The left ventricular systolic function is normal.   LV end diastolic pressure is normal.   The left ventricular ejection fraction is 55-65% by visual estimate.   Recommend uninterrupted dual antiplatelet therapy with Aspirin  81mg  daily and Clopidogrel 75mg  daily for a minimum of 6 months (stable ischemic heart disease-Class I recommendation). 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 Continued patency of stents in the proximal and distal RCA Normal LV function Normal LVEDP Successful PCI of the mid RCA with DES Plan: DAPT for at least 6 months. Will observe overnight. Anticipate DC in am. Given findings would recommend any cath procedures in the future be done via femoral approach.    Patient Profile    83 y.o. male with history of HTN, HLD and CAD who is admitted post PCI of the RCA on 10/16/24 for post cath monitoring. He has  been having unstable angina. Dr. Jordan performed his cath yesterday and was found to have a severe stenosis in the mid RCA. Patent proximal and distal RCA stents. A drug eluting stent was placed in the distal RCA.   Assessment & Plan   CAD with unstable angina: He is doing well this am. No chest pain. Will plan to continue DAPT with ASA and Plavix for at least six months. Continue statin and beta blocker.   Discharge home today.    For questions or updates, please contact [facility] Please consult [email or web address] for contact info under     Signed, Lonni Cash, MD  10/17/2024, 7:39 AM

## 2024-10-17 NOTE — Inpatient Diabetes Management (Signed)
 Inpatient Diabetes Program Recommendations  AACE/ADA: New Consensus Statement on Inpatient Glycemic Control (2015)  Target Ranges:  Prepandial:   less than 140 mg/dL      Peak postprandial:   less than 180 mg/dL (1-2 hours)      Critically ill patients:  140 - 180 mg/dL   Lab Results  Component Value Date   GLUCAP 263 (H) 10/16/2024   HGBA1C 8.0 (H) 05/23/2024    Latest Reference Range & Units 10/16/24 11:56  Glucose-Capillary 70 - 99 mg/dL 736 (H)  (H): Data is abnormally high  Diabetes history: DM2 Outpatient Diabetes medications:  Metformin  500 mg bid Current orders for Inpatient glycemic control: No orders yet  Inpatient Diabetes Program Recommendations:   Please consider: -Add Novolog  0-9 units tid, 0-5 units hs  Thank you, Duanne Duchesne E. Lexii Walsh, RN, MSN, CNS, CDCES  Diabetes Coordinator Inpatient Glycemic Control Team Team Pager [phone] (8am-5pm) 10/17/2024 10:37 AM

## 2024-10-17 NOTE — Discharge Summary (Cosign Needed)
 Discharge Summary   Patient ID: Peter Blair MRN: [id number]; DOB: [birth year]  Admit date: 10/16/2024 Discharge date: 10/17/2024  PCP:  Jimmy Charlie FERNS, MD   [facility] Providers Cardiologist:  Peter Jordan, MD     Discharge Diagnoses  Principal Problem:   Unstable angina [facility]) Active Problems:   Hyperlipidemia   Essential hypertension, benign   Type 2 diabetes mellitus with other circulatory complications [facility])   Diagnostic Studies/Procedures   Cath: 10/16/2024    2nd Diag lesion is 90% stenosed.   Prox RCA to Mid RCA lesion is 95% stenosed.   Mid RCA lesion is 35% stenosed.   Previously placed Ost RCA to Prox RCA stent of unknown type is  widely patent.   Previously placed Dist RCA stent of unknown type is  widely patent.   A drug-eluting stent was successfully placed using a STENT ONYX FRONTIER 4.0X18.   Post intervention, there is a 0% residual stenosis.   The left ventricular systolic function is normal.   LV end diastolic pressure is normal.   The left ventricular ejection fraction is 55-65% by visual estimate.   Recommend uninterrupted dual antiplatelet therapy with Aspirin  81mg  daily and Clopidogrel 75mg  daily for a minimum of 6 months (stable ischemic heart disease-Class I recommendation).   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 Continued patency of stents in the proximal and distal RCA Normal LV function Normal LVEDP Successful PCI of the mid RCA with DES   Plan: DAPT for at least 6 months. Will observe overnight. Anticipate DC in am. Given findings would recommend any cath procedures in the future be done via femoral approach.  Diagnostic Dominance: Right  Intervention   _____________   History of Present Illness   Peter Blair is a 83 y.o. male with CAD with stenting to the proximal and distal RCA in 2010, in-stent restenosis in 2011, hyperlipidemia, hypertension who was previously followed by Dr. Rolan as an  outpatient.  He was most recently seen in the office with Dr. Jordan on 11/5 and reported for 3 months he had been experiencing recurrent angina.  Given his symptoms he was set up for outpatient cardiac catheterization.   Hospital Course    Unstable angina -- Underwent cardiac catheterization noted above with 2 vessel obstructive CAD with culprit lesion being mid RCA treated with PCI/DES x 1, continued patency of stents in the proximal and distal RCA.  Normal LV function.  Recommendations for DAPT with aspirin /Plavix for at least 6 months.  Of note recommendations for any future Procedures to be done from femoral approach. -- Continue aspirin , Plavix, Crestor  5 mg daily (max dose tolerated)  Hypertension -- Continue lisinopril  40 mg daily, metoprolol  tartrate 25 mg twice daily  Hyperlipidemia -- As noted above on Crestor  5 mg daily, this is the max dose he has been able to tolerate  Diabetes -- Hemoglobin A1c 05/2024 of 8, on metformin .  Could consider GLP-1 outpatient.  She was seen by Dr. Verlin and deemed stable for discharge home.  Follow-up arranged in the office.  Medication sent to [facility] pharmacy and educated by Pharm.D. prior to discharge.  Did the patient have an acute coronary syndrome (MI, NSTEMI, STEMI, etc) this admission?:  No                               Did the patient have a percutaneous coronary intervention (stent / angioplasty)?:  Yes.     Cath/PCI Registry Performance & Quality Measures: Aspirin  prescribed? - Yes ADP Receptor Inhibitor (Plavix/Clopidogrel, Brilinta/Ticagrelor or Effient /Prasugrel ) prescribed (includes medically managed patients)? - Yes High Intensity Statin (Lipitor 40-80mg  or Crestor  20-40mg ) prescribed? - No - intolerant For EF <40%, was ACEI/ARB prescribed? - Not Applicable (EF >/= 40%) For EF <40%, Aldosterone Antagonist (Spironolactone or Eplerenone) prescribed? - Not Applicable (EF >/= 40%) Cardiac Rehab Phase II ordered? - Yes       The  patient will be scheduled for a TOC follow up appointment in 10-14 days.  A message has been sent to the [facility] and Scheduling Pool at the office where the patient should be seen for follow up.  _____________  Discharge Vitals Blood pressure 129/70, pulse 88, temperature 97.6 F (36.4 C), temperature source Oral, resp. rate 19, height 5' 9 (1.753 m), weight 84.8 kg, SpO2 94%.  Filed Weights   10/16/24 1152 10/16/24 1621  Weight: 87.1 kg 84.8 kg    Labs & Radiologic Studies  CBC Recent Labs    10/16/24 1732 10/17/24 0204  WBC 8.5 8.2  HGB 14.4 12.8*  HCT 41.2 36.9*  MCV 89.8 91.3  PLT 171 155   Basic Metabolic Panel Recent Labs    88/87/74 1732 10/17/24 0204  NA  --  135  K  --  4.3  CL  --  104  CO2  --  22  GLUCOSE  --  248*  BUN  --  20  CREATININE 0.97 1.24  CALCIUM   --  9.2   Liver Function Tests No results for input(s): AST, ALT, ALKPHOS, BILITOT, PROT, ALBUMIN in the last 72 hours. No results for input(s): LIPASE, AMYLASE in the last 72 hours. High Sensitivity Troponin:   No results for input(s): TROPONINIHS in the last 720 hours.  No results for input(s): TRNPT in the last 720 hours.  BNP Invalid input(s): POCBNP No results for input(s): PROBNP in the last 72 hours.  No results for input(s): BNP in the last 72 hours.  D-Dimer No results for input(s): DDIMER in the last 72 hours. Hemoglobin A1C No results for input(s): HGBA1C in the last 72 hours. Fasting Lipid Panel No results for input(s): CHOL, HDL, LDLCALC, TRIG, CHOLHDL, LDLDIRECT in the last 72 hours. No results found for: LIPOA  Thyroid Function Tests No results for input(s): TSH, T4TOTAL, T3FREE, THYROIDAB in the last 72 hours.  Invalid input(s): FREET3 _____________  CARDIAC CATHETERIZATION Result Date: 10/16/2024   2nd Diag lesion is 90% stenosed.   Prox RCA to Mid RCA lesion is 95% stenosed.   Mid RCA lesion is 35% stenosed.    Previously placed Ost RCA to Prox RCA stent of unknown type is  widely patent.   Previously placed Dist RCA stent of unknown type is  widely patent.   A drug-eluting stent was successfully placed using a STENT ONYX FRONTIER 4.0X18.   Post intervention, there is a 0% residual stenosis.   The left ventricular systolic function is normal.   LV end diastolic pressure is normal.   The left ventricular ejection fraction is 55-65% by visual estimate.   Recommend uninterrupted dual antiplatelet therapy with Aspirin  81mg  daily and Clopidogrel 75mg  daily for a minimum of 6 months (stable ischemic heart disease-Class I recommendation). 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 Continued patency of stents in the proximal and distal RCA Normal LV function Normal LVEDP Successful PCI of the mid RCA with DES Plan: DAPT for  at least 6 months. Will observe overnight. Anticipate DC in am. Given findings would recommend any cath procedures in the future be done via femoral approach.    Disposition Pt is being discharged home today in good condition.  Follow-up Plans & Appointments  Discharge Instructions     Amb Referral to Cardiac Rehabilitation   Complete by: As directed    Diagnosis:  Coronary Stents PTCA     After initial evaluation and assessments completed: Virtual Based Care may be provided alone or in conjunction with Phase 2 Cardiac Rehab based on patient barriers.: Yes   Intensive Cardiac Rehabilitation (ICR) [facility] location only OR Traditional Cardiac Rehabilitation (TCR) *If criteria for ICR are not met will enroll in TCR [facility] only): Yes   Call MD for:  difficulty breathing, headache or visual disturbances   Complete by: As directed    Call MD for:  persistant dizziness or light-headedness   Complete by: As directed    Call MD for:  redness, tenderness, or signs of infection (pain, swelling, redness, odor or green/yellow discharge around incision site)   Complete by: As directed     Diet - low sodium heart healthy   Complete by: As directed    Discharge instructions   Complete by: As directed    Radial Site Care Refer to this sheet in the next few weeks. These instructions provide you with information on caring for yourself after your procedure. Your caregiver may also give you more specific instructions. Your treatment has been planned according to current medical practices, but problems sometimes occur. Call your caregiver if you have any problems or questions after your procedure. HOME CARE INSTRUCTIONS You may shower the day after the procedure. Remove the bandage (dressing) and gently wash the site with plain soap and water. Gently pat the site dry.  Do not apply powder or lotion to the site.  Do not submerge the affected site in water for 3 to 5 days.  Inspect the site at least twice daily.  Do not flex or bend the affected arm for 24 hours.  No lifting over 5 pounds (2.3 kg) for 5 days after your procedure.  Do not drive home if you are discharged the same day of the procedure. Have someone else drive you.  You may drive 24 hours after the procedure unless otherwise instructed by your caregiver.  What to expect: Any bruising will usually fade within 1 to 2 weeks.  Blood that collects in the tissue (hematoma) may be painful to the touch. It should usually decrease in size and tenderness within 1 to 2 weeks.  SEEK IMMEDIATE MEDICAL CARE IF: You have unusual pain at the radial site.  You have redness, warmth, swelling, or pain at the radial site.  You have drainage (other than a small amount of blood on the dressing).  You have chills.  You have a fever or persistent symptoms for more than 72 hours.  You have a fever and your symptoms suddenly get worse.  Your arm becomes pale, cool, tingly, or numb.  You have heavy bleeding from the site. Hold pressure on the site.   PLEASE DO NOT MISS ANY DOSES OF YOUR PLAVIX!!!!! Also keep a log of you blood pressures and  bring back to your follow up appt. Please call the office with any questions.   Patients taking blood thinners should generally stay away from medicines like ibuprofen, Advil, Motrin, naproxen, and Aleve due to risk of stomach bleeding. You may take  Tylenol  as directed or talk to your primary doctor about alternatives.  Some studies suggest Prilosec/Omeprazole interacts with Plavix. We changed your Prilosec/Omeprazole to the equivalent dose of Protonix  for less chance of interaction.  PLEASE ENSURE THAT YOU DO NOT RUN OUT OF YOUR PLAVIX. This medication is very important to remain on for at least one year. IF you have issues obtaining this medication due to cost please CALL the office 3-5 business days prior to running out in order to prevent missing doses of this medication.   Increase activity slowly   Complete by: As directed        Discharge Medications Allergies as of 10/17/2024       Reactions   Iodinated Contrast Media Nausea And Vomiting   Iohexol Itching        Medication List     STOP taking these medications    diphenhydrAMINE  50 MG tablet Commonly known as: BENADRYL    omeprazole 20 MG capsule Commonly known as: PRILOSEC   predniSONE  50 MG tablet Commonly known as: DELTASONE        TAKE these medications    Accu-Chek FastClix Lancets Misc 1 each by In Vitro route daily.   Accu-Chek Guide test strip Generic drug: glucose blood USE 1 STRIP ONCE DAILY   acetaminophen  500 MG tablet Commonly known as: TYLENOL  Take 1,000 mg by mouth every 6 (six) hours as needed for mild pain (pain score 1-3).   amLODipine  10 MG tablet Commonly known as: NORVASC  TAKE 1 TABLET BY MOUTH EVERY DAY   aspirin  81 MG chewable tablet Chew 1 tablet (81 mg total) by mouth 2 (two) times daily. What changed: when to take this   clopidogrel 75 MG tablet Commonly known as: PLAVIX Take 1 tablet (75 mg total) by mouth daily with breakfast. Start taking on: October 18, 2024    Coenzyme Q10 200 MG Tabs Take 1 tablet by mouth daily.   diclofenac Sodium 1 % Gel Commonly known as: VOLTAREN Apply 4 g topically 4 (four) times daily.   lisinopril  40 MG tablet Commonly known as: ZESTRIL  TAKE 1 TABLET BY MOUTH EVERY DAY   metFORMIN  500 MG tablet Commonly known as: GLUCOPHAGE  TAKE 1 TABLET BY MOUTH TWICE A DAY   metoprolol  tartrate 25 MG tablet Commonly known as: LOPRESSOR  TAKE 1 TABLET BY MOUTH TWICE A DAY   nitroGLYCERIN 0.4 MG SL tablet Commonly known as: NITROSTAT Place 1 tablet (0.4 mg total) under the tongue every 5 (five) minutes as needed for chest pain.   pantoprazole  40 MG tablet Commonly known as: PROTONIX  Take 1 tablet (40 mg total) by mouth daily. Start taking on: October 18, 2024   rosuvastatin  10 MG tablet Commonly known as: CRESTOR  TAKE 1 TABLET BY MOUTH EVERYDAY AT BEDTIME What changed: See the new instructions.   sildenafil  20 MG tablet Commonly known as: REVATIO  TAKE 3 TO 5 TABLETS BY MOUTH DAILY AS DIRECTED   tamsulosin  0.4 MG Caps capsule Commonly known as: FLOMAX  TAKE 1 CAPSULE BY MOUTH EVERY DAY What changed: when to take this         Outstanding Labs/Studies  N/a   Duration of Discharge Encounter: APP Time: 15 minutes   Signed, Manuelita Rummer, NP 10/17/2024, 11:01 AM  [name] was seen by me today along with Manuelita Rummer, NP. I have personally performed an evaluation on this patient.  My findings are as follows:  83 y.o. male with h/o HTN, HLD, CAD admitted post PCI of the RCA on 10/16/24.  He has been having unstable angina. Severe RCA stenosis treated with a DES. No chest pain on morning after PcI. See my full note 10/17/24 which is composed entirely on my own.      Data: EKG(s) and pertinent labs, studies, etc were personally reviewed and interpreted by me:  Telemetry reviewed by me: sinus All labs reviewed by me Otherwise, I agree with data as outlined by the advanced practice  provider.  Exam performed by me: Gen: NAD Neck: No JVD Cardiac:  RRR without murmur Lungs: clear bilaterally Extremities: No LE edema  My Assessment and Plan:  CAD with unstable angina: Doing well. No CP. Continue DAPT with ASA and Plavix for six months. Continue statin and beta blocker.    The patient will be discharged home today  I spent 20 minutes seeing the patient. During that time, I reviewed the labs, EKG, telemetry, cath films, evaluated their symptoms and performed an examination. This time also included plan formulation, discussion of the plan with the patient and the time spent with documentation.   Signed,  Lonni Cash, MD  10/18/2024 11:06 AM

## 2024-10-17 NOTE — TOC CM/SW Note (Signed)
 Transition of Care [facility]) - Inpatient Brief Assessment   Patient Details  Name: Peter Blair MRN: [id number] Date of Birth: 12-26-1940  Transition of Care [facility]) CM/SW Contact:    Lauraine FORBES Saa, LCSWA Phone Number: 10/17/2024, 9:41 AM   Clinical Narrative:  9:41 AM Per chart review, patient resides at home with spouse. Patient has a PCP and insurance. Patient does not have SNF history. Patient has HH history with [facility]. Patient has DME (RW) history with [facility]. [phone]. No TOC needs identified at this time. TOC will continue to follow.  Transition of Care Asessment: Insurance and Status: Insurance coverage has been reviewed Patient has primary care physician: Yes Home environment has been reviewed: Private Residence Prior level of function:: N/A Prior/Current Home Services: No current home services Social Drivers of Health Review: SDOH reviewed no interventions necessary Readmission risk has been reviewed: Yes (Currently Outpatient in Bed Status) Transition of care needs: no transition of care needs at this time

## 2024-10-17 NOTE — Progress Notes (Signed)
 Went over AVS with patient/family member, answered any/all questions, stent card and TOC meds given, Ivs removed and patient was wheeled out to family vehicle.

## 2024-10-17 NOTE — Progress Notes (Addendum)
 Discussed with pt and wife MI,  stent, restrictions, Plavix importance,  diet, exercise, NTG, and CRPII. Pt receptive. Will refer to [facility] CRPII. Asked pt to walk with his wife. [phone] Aliene Aris BS, ACSM-CEP 10/04/2024 2:22 PM

## 2024-10-17 NOTE — TOC Transition Note (Signed)
 Transition of Care [facility]) - Discharge Note   Patient Details  Name: Peter Blair MRN: [id number] Date of Birth: 12-27-1940  Transition of [facility]) CM/SW Contact:  Roxie KANDICE Stain, RN Phone Number: 10/17/2024, 12:03 PM   Clinical Narrative:    [name] is stable to discharge home.  No ICM (Inpatient Care Management) needs at this time.    Final next level of care: Home/Self Care Barriers to Discharge: Barriers Resolved   Patient Goals and CMS Choice Patient states their goals for this hospitalization and ongoing recovery are:: return home          Discharge Placement               home        Discharge Plan and Services Additional resources added to the After Visit Summary for                                       Social Drivers of Health (SDOH) Interventions SDOH Screenings   Food Insecurity: No Food Insecurity (10/16/2024)  Housing: Low Risk  (10/16/2024)  Transportation Needs: No Transportation Needs (10/16/2024)  Utilities: Not At Risk (10/16/2024)  Depression (PHQ2-9): Low Risk  (05/23/2024)  Social Connections: Moderately Integrated (10/16/2024)  Tobacco Use: Medium Risk (10/17/2024)     Readmission Risk Interventions     No data to display

## 2024-10-18 ENCOUNTER — Telehealth (HOSPITAL_COMMUNITY): Payer: Self-pay

## 2024-10-18 NOTE — Telephone Encounter (Signed)
 Called patient to see if he was interested in cardiac Rehab.  LVCTCB

## 2024-10-19 LAB — LIPOPROTEIN A (LPA): Lipoprotein (a): 220.7 nmol/L — ABNORMAL HIGH (ref <75.0)

## 2024-10-21 ENCOUNTER — Telehealth (HOSPITAL_COMMUNITY): Payer: Self-pay

## 2024-10-21 NOTE — Telephone Encounter (Signed)
 Patient called and said that he was not interested in Cardiac rehab right now because of shoulder and hip issues.

## 2024-11-04 ENCOUNTER — Telehealth: Payer: Self-pay | Admitting: Nurse Practitioner

## 2024-11-04 NOTE — Telephone Encounter (Unsigned)
 Copied from CRM #[id number]. Topic: Clinical - Medication Refill >> Nov 04, 2024  4:00 PM Charlet HERO wrote: Medication: lisinopril  (ZESTRIL ) 40 MG tablet  Has the patient contacted their pharmacy? Yes Did not see request from the pharmacy  This is the patient's preferred pharmacy:  [facility] [address] [place] - [address] [place] [address] Phone: [phone] Fax: [phone]    Is this the correct pharmacy for this prescription? Yes If no, delete pharmacy and type the correct one.   Has the prescription been filled recently? Yes  Is the patient out of the medication? Yes  Has the patient been seen for an appointment in the last year OR does the patient have an upcoming appointment? Yes  Can we respond through MyChart? No  Agent: Please be advised that Rx refills may take up to 3 business days. We ask that you follow-up with your pharmacy.

## 2024-11-04 NOTE — Progress Notes (Unsigned)
 Cardiology Office Note:    Date:  11/14/2024   ID:  Peter Blair, DOB August 16, 1941, MRN [id number]  PCP:  Wendee Lynwood HERO, NP   [facility] Providers Cardiologist:  Cayetano Mikita, MD     Referring MD: No ref. provider found   Chief Complaint  Patient presents with   Coronary Artery Disease    History of Present Illness:    Peter Blair is a 83 y.o. male seen at the request of Dr Jimmy to reestablish cardiac cath. Followed in the past by Dr Rolan. Last seen in 2017. He is s/p stenting of the proximal and distal RCA in 2010 with 4.0 x 20 Veriflex and 3.5 x 15 mm Veriflex BMS. In 2011 he had in stent restenosis in the proximal RCA treated with a 4.0 x 23 mm Promus DES dilated to 4.5 mm. Had negative Myoview study in 2013. He has a history of HTN and HLD. Prior intolerance listed to lipitor.   On follow up today he reports that for 3 months he has experienced recurrent angina. For him this is pain in his upper teeth and tightness radiating across his upper chest. No nausea or sweats. No dyspnea. Resolves with rest. Worse with activity such as walking stairs or walking up driveway. Has not used Ntg. States symptoms are becoming more frequent and on a couple of occasions was bad enough that he almost went to ED. He does have a number of orthopedic issues. And may need THR.   He had follow up cath showing new lesion in the mid RCA which was successfully treated with DES. He also had disease in the second diagonal managed medically.   He notes his chest discomfort has resolved. Notes his sugar is running higher. Still lacks energy.   Past Medical History:  Diagnosis Date   Coronary artery disease    exertional chest pain, cath 12/10 showed EF 50-55% with mild inferior hypokensis, 99% proximal and 60% distal RCA stenosis, 80% prox stenosis of a moderate sized D1, 40% prox LAD. Patient had BMS x2 placed in RCA. Unstable Angina. PROMUS DES to the prox RCA   Diabetes mellitus without  complication (HCC)    Erectile dysfunction    GERD (gastroesophageal reflux disease)    Hyperlipidemia    Hypertension    Nephrolithiasis    Dr. Ike   Renal cyst    Right shoulder pain    rotator cuff   Skin cancer    Skin cancer, PMH of, cell type unknown, Dr. Shona    Past Surgical History:  Procedure Laterality Date   CATARACT EXTRACTION W/ INTRAOCULAR LENS IMPLANT Bilateral    colonoscopy with ploypectomy  03/05/2012    Dr Jakie, GI   CORONARY ANGIOPLASTY WITH STENT PLACEMENT     X 3 total stents   CORONARY STENT INTERVENTION N/A 10/16/2024   Procedure: CORONARY STENT INTERVENTION;  Surgeon: Nashalie Sallis M, MD;  Location: [facility] INVASIVE CV LAB;  Service: Cardiovascular;  Laterality: N/A;   cystoscopic removal  04/04/2010   CYSTOSCOPY N/A 12/15/2023   Procedure: CYSTOSCOPY FLEXIBLE; URETHERAL DILATION; INSERTION OF FOLEY CATHETER;  Surgeon: Cam Morene ORN, MD;  Location: [facility] ORS;  Service: Urology;  Laterality: N/A;   Flexibe sigmoidoscopy   12/06/1999   LEFT HEART CATH AND CORONARY ANGIOGRAPHY N/A 10/16/2024   Procedure: LEFT HEART CATH AND CORONARY ANGIOGRAPHY;  Surgeon: Porschea Borys M, MD;  Location: [facility] INVASIVE CV LAB;  Service: Cardiovascular;  Laterality: N/A;   LITHOTRIPSY  x 1   RECONSTRUCTION OF EYELID Left    TOTAL KNEE ARTHROPLASTY Left 12/15/2023   Procedure: LEFT TOTAL KNEE ARTHROPLASTY;  Surgeon: Vernetta Lonni GRADE, MD;  Location: [facility] OR[facility];  Service: Orthopedics;  Laterality: Left;    Current Medications: Current Meds  Medication Sig   Accu-Chek FastClix Lancets MISC 1 each by In Vitro route daily.   acetaminophen  (TYLENOL ) 500 MG tablet Take 1,000 mg by mouth every 6 (six) hours as needed for mild pain (pain score 1-3).   amLODipine  (NORVASC ) 10 MG tablet TAKE 1 TABLET BY MOUTH EVERY DAY   clopidogrel  (PLAVIX ) 75 MG tablet Take 1 tablet (75 mg total) by mouth daily with breakfast.   Coenzyme Q10 200 MG TABS Take 1 tablet by mouth daily.     diclofenac Sodium (VOLTAREN) 1 % GEL Apply 4 g topically 4 (four) times daily.   glucose blood (ACCU-CHEK GUIDE) test strip USE 1 STRIP ONCE DAILY   lisinopril  (ZESTRIL ) 40 MG tablet Take 1 tablet (40 mg total) by mouth daily.   metFORMIN  (GLUCOPHAGE ) 500 MG tablet TAKE 1 TABLET BY MOUTH TWICE A DAY   metoprolol  tartrate (LOPRESSOR ) 25 MG tablet TAKE 1 TABLET BY MOUTH TWICE A DAY   nitroGLYCERIN  (NITROSTAT ) 0.4 MG SL tablet Place 1 tablet (0.4 mg total) under the tongue every 5 (five) minutes as needed for chest pain.   pantoprazole  (PROTONIX ) 40 MG tablet Take 1 tablet (40 mg total) by mouth daily.   rosuvastatin  (CRESTOR ) 10 MG tablet TAKE 1 TABLET BY MOUTH EVERYDAY AT BEDTIME (Patient taking differently: Take 5 mg by mouth daily.)   sildenafil  (REVATIO ) 20 MG tablet TAKE 3 TO 5 TABLETS BY MOUTH DAILY AS DIRECTED   tamsulosin  (FLOMAX ) 0.4 MG CAPS capsule TAKE 1 CAPSULE BY MOUTH EVERY DAY (Patient taking differently: Take 0.4 mg by mouth every other day.)   [DISCONTINUED] aspirin  81 MG chewable tablet Chew 1 tablet (81 mg total) by mouth 2 (two) times daily. (Patient taking differently: Chew 81 mg by mouth daily.)     Allergies:   Iodinated contrast media and Iohexol    Social History   Socioeconomic History   Marital status: Married    Spouse name: Not on file   Number of children: 0   Years of education: Not on file   Highest education level: Not on file  Occupational History   Occupation: Sports coach    Comment: Retired  Tobacco Use   Smoking status: Former    Current packs/day: 0.00    Types: Cigarettes    Quit date: 12/05/1970    Years since quitting: 53.9    Passive exposure: Never   Smokeless tobacco: Never   Tobacco comments:    smoked 1961-1972, up to 1 ppd  Vaping Use   Vaping status: Never Used  Substance and Sexual Activity   Alcohol use: Yes    Comment: minimally, 6 beers/year   Drug use: No   Sexual activity: Not on file  Other Topics Concern   Not on  file  Social History Narrative   Has living will    Wife is health care POA-- no alternate set up (??niece or sister)   Would accept resuscitation attempts   No extended tube feedings if cognitively unaware   Social Drivers of Health   Tobacco Use: Medium Risk (11/14/2024)   Patient History    Smoking Tobacco Use: Former    Smokeless Tobacco Use: Never    Passive Exposure: Never  Physicist, Medical Strain: Not  on file  Food Insecurity: No Food Insecurity (10/16/2024)   Epic    Worried About Programme Researcher, Broadcasting/film/video in the Last Year: Never true    Ran Out of Food in the Last Year: Never true  Transportation Needs: No Transportation Needs (10/16/2024)   Epic    Lack of Transportation (Medical): No    Lack of Transportation (Non-Medical): No  Physical Activity: Not on file  Stress: Not on file  Social Connections: Moderately Integrated (10/16/2024)   Social Connection and Isolation Panel    Frequency of Communication with Friends and Family: Three times a week    Frequency of Social Gatherings with Friends and Family: Once a week    Attends Religious Services: 1 to 4 times per year    Active Member of [employer] or Organizations: No    Attends Banker Meetings: Never    Marital Status: Married  Depression (PHQ2-9): Low Risk (05/23/2024)   Depression (PHQ2-9)    PHQ-2 Score: 0  Alcohol Screen: Not on file  Housing: Low Risk (10/16/2024)   Epic    Unable to Pay for Housing in the Last Year: No    Number of Times Moved in the Last Year: 0    Homeless in the Last Year: No  Utilities: Not At Risk (10/16/2024)   Epic    Threatened with loss of utilities: No  Health Literacy: Not on file     Family History: The patient's family history includes Breast cancer in his mother and sister; Cervical cancer in his mother; Colon cancer in his sister; Diabetes in his father; Heart attack (age of onset: 40) in his father. There is no history of Stroke.  ROS:   Please see the history  of present illness.     All other systems reviewed and are negative.  EKGs/Labs/Other Studies Reviewed:    The following studies were reviewed today:  Cardiac cath 10/16/24:  LEFT HEART CATH AND CORONARY ANGIOGRAPHY  CORONARY STENT INTERVENTION   Conclusion      2nd Diag lesion is 90% stenosed.   Prox RCA to Mid RCA lesion is 95% stenosed.   Mid RCA lesion is 35% stenosed.   Previously placed Ost RCA to Prox RCA stent of unknown type is  widely patent.   Previously placed Dist RCA stent of unknown type is  widely patent.   A drug-eluting stent was successfully placed using a STENT ONYX FRONTIER 4.0X18.   Post intervention, there is a 0% residual stenosis.   The left ventricular systolic function is normal.   LV end diastolic pressure is normal.   The left ventricular ejection fraction is 55-65% by visual estimate.   Recommend uninterrupted dual antiplatelet therapy with Aspirin  81mg  daily and Clopidogrel  75mg  daily for a minimum of 6 months (stable ischemic heart disease-Class I recommendation).   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 Continued patency of stents in the proximal and distal RCA Normal LV function Normal LVEDP Successful PCI of the mid RCA with DES   Plan: DAPT for at least 6 months. Will observe overnight. Anticipate DC in am. Given findings would recommend any cath procedures in the future be done via femoral approach.  Coronary Diagrams  Diagnostic Dominance: Right  Intervention        Recent Labs: 05/23/2024: ALT 15 10/17/2024: BUN 20; Creatinine, Ser 1.24; Hemoglobin 12.8; Platelets 155; Potassium 4.3; Sodium 135  Recent Lipid Panel    Component Value Date/Time   CHOL  120 05/23/2024 1449   TRIG 100.0 05/23/2024 1449   HDL 39.70 05/23/2024 1449   CHOLHDL 3 05/23/2024 1449   VLDL 20.0 05/23/2024 1449   LDLCALC 61 05/23/2024 1449   LDLDIRECT 66.2 09/16/2013 1553     Risk Assessment/Calculations:       Physical Exam:     VS:  BP 138/84   Pulse 75   Ht 5' 9 (1.753 m)   Wt 193 lb 12.8 oz (87.9 kg)   SpO2 97%   BMI 28.62 kg/m     Wt Readings from Last 3 Encounters:  11/14/24 193 lb 12.8 oz (87.9 kg)  10/16/24 187 lb (84.8 kg)  10/09/24 192 lb (87.1 kg)     GEN:  Well nourished, well developed in no acute distress HEENT: Normal NECK: No JVD; No carotid bruits LYMPHATICS: No lymphadenopathy CARDIAC: RRR, no murmurs, rubs, gallops RESPIRATORY:  Clear to auscultation without rales, wheezing or rhonchi  ABDOMEN: Soft, non-tender, non-distended MUSCULOSKELETAL:  No edema; No deformity. Radial site is ok SKIN: Warm and dry NEUROLOGIC:  Alert and oriented x 3 PSYCHIATRIC:  Normal affect   ASSESSMENT:    1. Unstable angina (HCC)   2. Coronary artery disease involving native coronary artery of native heart with unstable angina pectoris (HCC)   3. Essential hypertension, benign   4. Hypercholesterolemia   5. Type 2 diabetes mellitus without complication, without long-term current use of insulin  (HCC)     PLAN:    In order of problems listed above:  CAD s/p prior BMS of proximal and distal RCA in 2010. S/p DES of proximal RCA in 2011 for restenosis. Now with accelerating anginal symptoms despite good medical therapy with statin, ASA, metoprolol  and amlodipine . Cardiac cath showed new severe stenosis in mid RCA treated with DES. Residual severe stenosis in second diagonal will be managed medically. Plan to continue Plavix  for at least a year. May consider indefinite use given multiple stents. Would need to postpone any elective surgery for 6 months.  DM on metformin . Last A1c 8%. May need to consider GLP 1 agonist or SGLT 2 inhibitor. Has follow up  with PCP 12/29 HLD. On Crestor  at maximally tolerated dose. LDL 61 HTN on multiple meds.  Controlled.      Follow up in 4 months.   Medication Adjustments/Labs and Tests Ordered: Current medicines are reviewed at length with the patient today.   Concerns regarding medicines are outlined above.  No orders of the defined types were placed in this encounter.  Meds ordered this encounter  Medications   aspirin  81 MG chewable tablet    Sig: Chew 1 tablet (81 mg total) by mouth daily.    Patient Instructions  Medication Instructions:  Continue same medications *If you need a refill on your cardiac medications before your next appointment, please call your pharmacy*  Lab Work: None ordered  Testing/Procedures: None ordered  Follow-Up: At [facility], you and your health needs are our priority.  As part of our continuing mission to provide you with exceptional heart care, our providers are all part of one team.  This team includes your primary Cardiologist (physician) and Advanced Practice Providers or APPs (Physician Assistants and Nurse Practitioners) who all work together to provide you with the care you need, when you need it.  Your next appointment:  4 months    Provider:  Dr.Tamatha Gadbois   We recommend signing up for the patient portal called MyChart.  Sign up information is provided on this After  Visit Summary.  MyChart is used to connect with patients for Virtual Visits (Telemedicine).  Patients are able to view lab/test results, encounter notes, upcoming appointments, etc.  Non-urgent messages can be sent to your provider as well.   To learn more about what you can do with MyChart, go to [email or web address].      Signed, Vaness Jelinski, MD  11/14/2024 12:00 PM    [facility]

## 2024-11-06 MED ORDER — LISINOPRIL 40 MG PO TABS: 40.0000 mg | ORAL_TABLET | Freq: Every day | ORAL | 0 refills | Status: AC

## 2024-11-14 ENCOUNTER — Ambulatory Visit: Attending: Cardiology | Admitting: Cardiology

## 2024-11-14 ENCOUNTER — Encounter: Payer: Self-pay | Admitting: Cardiology

## 2024-11-14 VITALS — BP 138/84 | HR 75 | Ht 69.0 in | Wt 193.8 lb

## 2024-11-14 DIAGNOSIS — E7800 Pure hypercholesterolemia, unspecified: Secondary | ICD-10-CM

## 2024-11-14 DIAGNOSIS — I10 Essential (primary) hypertension: Secondary | ICD-10-CM | POA: Diagnosis not present

## 2024-11-14 DIAGNOSIS — I25118 Atherosclerotic heart disease of native coronary artery with other forms of angina pectoris: Secondary | ICD-10-CM

## 2024-11-14 DIAGNOSIS — I200 Unstable angina: Secondary | ICD-10-CM

## 2024-11-14 DIAGNOSIS — I25110 Atherosclerotic heart disease of native coronary artery with unstable angina pectoris: Secondary | ICD-10-CM

## 2024-11-14 DIAGNOSIS — E119 Type 2 diabetes mellitus without complications: Secondary | ICD-10-CM | POA: Diagnosis not present

## 2024-11-14 NOTE — Patient Instructions (Addendum)
 Medication Instructions:  Continue same medications *If you need a refill on your cardiac medications before your next appointment, please call your pharmacy*  Lab Work: None ordered  Testing/Procedures: None ordered  Follow-Up: At [facility], you and your health needs are our priority.  As part of our continuing mission to provide you with exceptional heart care, our providers are all part of one team.  This team includes your primary Cardiologist (physician) and Advanced Practice Providers or APPs (Physician Assistants and Nurse Practitioners) who all work together to provide you with the care you need, when you need it.  Your next appointment:  4 months    Call in Feb to schedule April appointment     Provider:  Dr.Jordan   We recommend signing up for the patient portal called MyChart.  Sign up information is provided on this After Visit Summary.  MyChart is used to connect with patients for Virtual Visits (Telemedicine).  Patients are able to view lab/test results, encounter notes, upcoming appointments, etc.  Non-urgent messages can be sent to your provider as well.   To learn more about what you can do with MyChart, go to [email or web address].

## 2024-11-22 ENCOUNTER — Encounter: Admitting: Nurse Practitioner

## 2024-12-02 ENCOUNTER — Encounter: Payer: Self-pay | Admitting: Nurse Practitioner

## 2024-12-02 ENCOUNTER — Ambulatory Visit (INDEPENDENT_AMBULATORY_CARE_PROVIDER_SITE_OTHER): Admitting: Nurse Practitioner

## 2024-12-02 VITALS — BP 124/72 | HR 70 | Temp 97.7°F | Ht 68.5 in | Wt 194.6 lb

## 2024-12-02 DIAGNOSIS — I10 Essential (primary) hypertension: Secondary | ICD-10-CM | POA: Diagnosis not present

## 2024-12-02 DIAGNOSIS — N401 Enlarged prostate with lower urinary tract symptoms: Secondary | ICD-10-CM

## 2024-12-02 DIAGNOSIS — K219 Gastro-esophageal reflux disease without esophagitis: Secondary | ICD-10-CM

## 2024-12-02 DIAGNOSIS — N138 Other obstructive and reflux uropathy: Secondary | ICD-10-CM

## 2024-12-02 DIAGNOSIS — E785 Hyperlipidemia, unspecified: Secondary | ICD-10-CM | POA: Diagnosis not present

## 2024-12-02 DIAGNOSIS — Z7984 Long term (current) use of oral hypoglycemic drugs: Secondary | ICD-10-CM

## 2024-12-02 DIAGNOSIS — E1159 Type 2 diabetes mellitus with other circulatory complications: Secondary | ICD-10-CM | POA: Diagnosis not present

## 2024-12-02 LAB — POCT GLYCOSYLATED HEMOGLOBIN (HGB A1C): Hemoglobin A1C: 7.6 % — AB (ref 4.0–5.6)

## 2024-12-02 MED ORDER — EMPAGLIFLOZIN 10 MG PO TABS: 10.0000 mg | ORAL_TABLET | Freq: Every day | ORAL | 1 refills | Status: AC

## 2024-12-02 NOTE — Assessment & Plan Note (Signed)
 On metformin  500 twice daily A1c 7.6%.  Would like slightly better control as patient is likely undergoing jaw replacement and then will A1c below 7.7.  Will add on Jardiance  10 mg daily.

## 2024-12-02 NOTE — Progress Notes (Signed)
 "  Established Patient Office Visit  Subjective   Patient ID: Peter Blair, male    DOB: [birth year]  Age: 83 y.o. MRN: [id number]  Chief Complaint  Patient presents with   Transitions Of Care    HPI  DM2: Patient currently maintained on metformin  500 mg twice daily. He was checking his glucose at home. He is checking it once a day but not daily he is averaging 158  HTN: Currently maintained on amlodipine  10 mg daily, lisinopril  40 mg daily, metoprolol  25 mg twice daily  CAD: Recent stenting currently maintained on clopidogrel  75 mg daily, baby ASA, rosuvastatin  5 mg daily. He is follwing with cardiology   GERD: Patient currently maintained on Protonix  40 mg as needed approx 4 times a week   BPH: Currently maintained on Flomax  0.4 mg as needed he does   Tdap: 2021 Flu: up date at local pharmacy  COVID: original series and some boosters  PNA: 2015, needs Prevnar 20? Shingles: Up-to-date  Colonoscopy: aged out PSA: joint discussion and recommended against it   Review of Systems  Constitutional:  Negative for chills and fever.  Respiratory:  Negative for shortness of breath.   Cardiovascular:  Negative for chest pain.  Neurological:  Negative for headaches.      Objective:     BP 124/72   Pulse 70   Temp 97.7 F (36.5 C) (Oral)   Ht 5' 8.5 (1.74 m)   Wt 194 lb 9.6 oz (88.3 kg)   SpO2 97%   BMI 29.16 kg/m    Physical Exam Vitals and nursing note reviewed.  Constitutional:      Appearance: Normal appearance.  HENT:     Right Ear: Tympanic membrane, ear canal and external ear normal.     Left Ear: Tympanic membrane, ear canal and external ear normal.     Mouth/Throat:     Mouth: Mucous membranes are moist.     Pharynx: Oropharynx is clear.  Eyes:     Extraocular Movements: Extraocular movements intact.     Pupils: Pupils are equal, round, and reactive to light.  Cardiovascular:     Rate and Rhythm: Normal rate and regular rhythm.     Pulses: Normal  pulses.     Heart sounds: Normal heart sounds.  Pulmonary:     Effort: Pulmonary effort is normal.     Breath sounds: Normal breath sounds.  Abdominal:     General: Bowel sounds are normal.  Musculoskeletal:     Right lower leg: No edema.     Left lower leg: No edema.  Lymphadenopathy:     Cervical: No cervical adenopathy.  Skin:    General: Skin is warm.  Neurological:     General: No focal deficit present.     Mental Status: He is alert.     Deep Tendon Reflexes:     Reflex Scores:      Bicep reflexes are 2+ on the right side and 2+ on the left side.      Patellar reflexes are 2+ on the right side and 2+ on the left side.    Comments: Bilateral upper and lower extremity strength 5/5  Psychiatric:        Mood and Affect: Mood normal.        Behavior: Behavior normal.        Thought Content: Thought content normal.        Judgment: Judgment normal.      Results for orders placed or  performed in visit on 12/02/24  POCT glycosylated hemoglobin (Hb A1C)  Result Value Ref Range   Hemoglobin A1C 7.6 (A) 4.0 - 5.6 %   HbA1c POC (<> result, manual entry)     HbA1c, POC (prediabetic range)     HbA1c, POC (controlled diabetic range)        The ASCVD Risk score (Arnett DK, et al., 2019) failed to calculate for the following reasons:   The 2019 ASCVD risk score is only valid for ages 66 to 49   * - Cholesterol units were assumed    Assessment & Plan:   Problem List Items Addressed This Visit       Cardiovascular and Mediastinum   Essential hypertension, benign   Patient currently maintained on amlodipine  10 mg daily, lisinopril  40 mg daily, metoprolol  25 mg twice daily.  Blood pressure controlled.  Patient is followed by cardiology.  Continue      Type 2 diabetes mellitus with other circulatory complications (HCC) - Primary   On metformin  500 twice daily A1c 7.6%.  Would like slightly better control as patient is likely undergoing jaw replacement and then will A1c below  7.7.  Will add on Jardiance  10 mg daily.      Relevant Medications   empagliflozin  (JARDIANCE ) 10 MG TABS tablet   Other Relevant Orders   POCT glycosylated hemoglobin (Hb A1C) (Completed)     Digestive   GERD (gastroesophageal reflux disease)   Was on omeprazole in the past switch to Protonix  due to clopidogrel  use.  Patient will take 40 mg approximately 4 times a week.  Stable        Genitourinary   Benign prostatic hyperplasia with urinary obstruction   History of the same.  Patient will take tamsulosin  0.4 mg several times a week.  Not currently followed by urology.  Had discussion about doing possible PSA.  Patient recently saw urology earlier this year after urinary retention post surgery requiring catheter intervention.  Did prostate exam but did not do PSA.  Did have joint discussion about not recommended for his age.        Other   Hyperlipidemia   History of the same.  Recent cardiac cath with stenting done.  LPA elevated.  Patient currently maintained on rosuvastatin  Most recent lipid panel 05/23/2024 LDL of 61       Return in about 3 months (around 03/02/2025) for DM recheck.    Adina Crandall, NP  "

## 2024-12-02 NOTE — Assessment & Plan Note (Signed)
 Was on omeprazole in the past switch to Protonix  due to clopidogrel  use.  Patient will take 40 mg approximately 4 times a week.  Stable

## 2024-12-02 NOTE — Assessment & Plan Note (Signed)
 History of the same.  Recent cardiac cath with stenting done.  LPA elevated.  Patient currently maintained on rosuvastatin  Most recent lipid panel 05/23/2024 LDL of 61

## 2024-12-02 NOTE — Assessment & Plan Note (Signed)
 Patient currently maintained on amlodipine  10 mg daily, lisinopril  40 mg daily, metoprolol  25 mg twice daily.  Blood pressure controlled.  Patient is followed by cardiology.  Continue

## 2024-12-02 NOTE — Assessment & Plan Note (Signed)
 History of the same.  Patient will take tamsulosin  0.4 mg several times a week.  Not currently followed by urology.  Had discussion about doing possible PSA.  Patient recently saw urology earlier this year after urinary retention post surgery requiring catheter intervention.  Did prostate exam but did not do PSA.  Did have joint discussion about not recommended for his age.

## 2024-12-02 NOTE — Patient Instructions (Signed)
 Nice to see you today  I have sent in a new medication to the pharmacy  Get your flu vaccine and consider getting prevnar 20 at your local pharmacy  Follow up with me in 3 months, sooner if you need me

## 2024-12-10 ENCOUNTER — Other Ambulatory Visit: Payer: Self-pay | Admitting: Nurse Practitioner

## 2024-12-10 NOTE — Telephone Encounter (Unsigned)
 Copied from CRM [id number]. Topic: Clinical - Medication Refill >> Dec 10, 2024 10:48 AM Mesmerise C wrote: Medication: metoprolol  tartrate (LOPRESSOR ) 25 MG tablet  Has the patient contacted their pharmacy? Yes (Agent: If no, request that the patient contact the pharmacy for the refill. If patient does not wish to contact the pharmacy document the reason why and proceed with request.) (Agent: If yes, when and what did the pharmacy advise?)  This is the patient's preferred pharmacy:  [id number] [place], [place] - [address] [place] [address] Phone: [phone] Fax: [phone]  Is this the correct pharmacy for this prescription? Yes If no, delete pharmacy and type the correct one.   Has the prescription been filled recently? No  Is the patient out of the medication? Yes  Has the patient been seen for an appointment in the last year OR does the patient have an upcoming appointment? Yes  Can we respond through MyChart? Yes  Agent: Please be advised that Rx refills may take up to 3 business days. We ask that you follow-up with your pharmacy.

## 2024-12-11 ENCOUNTER — Encounter: Payer: Self-pay | Admitting: Sports Medicine

## 2024-12-11 MED ORDER — METOPROLOL TARTRATE 25 MG PO TABS: 25.0000 mg | ORAL_TABLET | Freq: Two times a day (BID) | ORAL | 3 refills | Status: AC

## 2024-12-23 ENCOUNTER — Telehealth: Payer: Self-pay

## 2024-12-23 MED ORDER — AMLODIPINE BESYLATE 10 MG PO TABS: 10.0000 mg | ORAL_TABLET | Freq: Every day | ORAL | 3 refills | Status: AC

## 2024-12-23 NOTE — Telephone Encounter (Signed)
 LAST APPOINTMENT DATE: 12/02/24 [facility]   NEXT APPOINTMENT DATE: 03/03/2025 [facility]   amLODipine  (NORVASC ) 10 MG tablet   LAST REFILL: 12/14/23  QTY: #90, 3RF

## 2024-12-23 NOTE — Addendum Note (Signed)
 Addended by: WENDEE LYNWOOD HERO on: 12/23/2024 04:54 PM   Modules accepted: Orders

## 2024-12-26 ENCOUNTER — Encounter: Payer: Self-pay | Admitting: Sports Medicine

## 2024-12-26 ENCOUNTER — Ambulatory Visit (INDEPENDENT_AMBULATORY_CARE_PROVIDER_SITE_OTHER): Admitting: Sports Medicine

## 2024-12-26 ENCOUNTER — Other Ambulatory Visit: Payer: Self-pay

## 2024-12-26 DIAGNOSIS — E1159 Type 2 diabetes mellitus with other circulatory complications: Secondary | ICD-10-CM | POA: Diagnosis not present

## 2024-12-26 DIAGNOSIS — G8929 Other chronic pain: Secondary | ICD-10-CM

## 2024-12-26 DIAGNOSIS — M1611 Unilateral primary osteoarthritis, right hip: Secondary | ICD-10-CM | POA: Diagnosis not present

## 2024-12-26 DIAGNOSIS — M25551 Pain in right hip: Secondary | ICD-10-CM

## 2024-12-26 MED ORDER — METHYLPREDNISOLONE ACETATE 40 MG/ML IJ SUSP
60.0000 mg | INTRAMUSCULAR | Status: AC | PRN
  Administered 2024-12-26: 60 mg via INTRA_ARTICULAR

## 2024-12-26 MED ORDER — LIDOCAINE HCL 1 % IJ SOLN
4.0000 mL | INTRAMUSCULAR | Status: AC | PRN
  Administered 2024-12-26: 4 mL

## 2024-12-26 NOTE — Progress Notes (Signed)
 Patient says that he did pretty well with the injection for his right hip last year, although his pain has gotten much worse recently. He says that he is thinking about a hip replacement, although he had a stent placed  in November, and cannot do the hip replacement for 6 months from that date. He has been using a walker for the last two days due to his worsening hip pain, and says that because Tylenol  has not helped he has taken Ibuprofen yesterday and today. He has more pain when he is walking while standing upright, but if he bends a bit with his walker he feels better. He is here for repeat injection today.

## 2024-12-26 NOTE — Progress Notes (Signed)
 "  Peter Blair - 84 y.o. male MRN [id number]  Date of birth: 1941/01/12  Office Visit Note: Visit Date: 12/26/2024 PCP: Wendee Lynwood HERO, NP Referred by: Wendee Lynwood HERO, NP  Subjective: Chief Complaint  Patient presents with   Right Hip - Pain   HPI: Peter Blair is a pleasant 84 y.o. male who presents today for acute on chronic right hip pain with OA.  Peter Blair presents with acute on chronic right hip pain which has progressed here recently where he is using and relying on a walker to ambulate.  Back in July 2025 we did proceed with ultrasound-guided intra-articular hip injection which did very well for him until just recently.  He has been seeing Dr. Vernetta and they were considering hip replacement given that his MRI showed more advanced arthritis than his hip x-ray, however he had a cardiac stent in November placed and needs to be on antiplatelet therapy for about 6 months so this would delay this.  He is looking for some help until that time.  He is a type-II diabetic. He is on metformin  500 mg twice daily and jardiance  10mg  qd.   Lab Results  Component Value Date   HGBA1C 7.6 (A) 12/02/2024   Pertinent ROS were reviewed with the patient and found to be negative unless otherwise specified above in HPI.   Assessment & Plan: Visit Diagnoses:  1. Unilateral primary osteoarthritis, right hip   2. Chronic right hip pain   3. Type 2 diabetes mellitus with other circulatory complications (HCC)    Plan: Impression is acute exacerbation of chronic right hip pain which has at least a moderate plus osteoarthritic change.  Recent MRI was reviewed today which does show more higher grade cartilage loss and osteoarthritis compared to his previous hip x-rays.  Also small effusion which is likely exacerbating his pain.  He is not a surgical candidate at this point given recent cardiac stent.  Through shared decision making, did proceed with ultrasound-guided intra-articular hip injection, patient  tolerated well.  May use ice/heat and/or Tylenol  for any pain going forward.  He is a type II diabetic, did discuss transient glucose rise given the injection and modifications to take if needed.  He is not on any insulin  therapy, but will continue his metformin  500 mg twice daily and Jardiance  10 mg daily.  *I did discuss with Peter Blair, if this injection gives him significant and lasting relief, he likely can postpone hip replacement.  I would like him to see Dr. Vernetta back in 1 month, as if he does not receive full relief from the injection or it is only temporary, he likely will need to get the ball rolling in terms of setting up a hip replacement once he is off dual antiplatelet therapy in May.  Could consider infrequent injections until that time only if needed.  Follow-up: Return in about 1 month (around 01/26/2025) for F/u with Dr. Vernetta 1 month R-hip.   Meds & Orders: No orders of the defined types were placed in this encounter.   Orders Placed This Encounter  Procedures   Large Joint Inj: R hip joint   US  Guided Needle Placement - No Linked Charges     Procedures: Large Joint Inj: R hip joint on 12/26/2024 2:02 PM Indications: pain and diagnostic evaluation Details: 22 G 3.5 in needle, ultrasound-guided anterior approach Medications: 4 mL lidocaine  1 %; 60 mg methylPREDNISolone  acetate 40 MG/ML Outcome: tolerated well, no immediate complications  Procedure: US -guided intra-articular  hip injection, Right After discussion on risks/benefits/indications and informed verbal consent was obtained, a timeout was performed. Patient was lying supine on exam table. The hip was cleaned with betadine  and alcohol swabs. Then utilizing ultrasound guidance, the patient's femoral head and neck junction was identified and subsequently injected with 4:1.5 lidocaine :depomedrol via an in-plane approach with ultrasound visualization of the injectate administered into the hip joint. Patient tolerated  procedure well without immediate complications.  Procedure, treatment alternatives, risks and benefits explained, specific risks discussed. Consent was given by the patient. Immediately prior to procedure a time out was called to verify the correct patient, procedure, equipment, support staff and site/side marked as required. Patient was prepped and draped in the usual sterile fashion.          Clinical History: No specialty comments available.  He reports that he quit smoking about 54 years ago. His smoking use included cigarettes. He has never been exposed to tobacco smoke. He has never used smokeless tobacco.  Recent Labs    05/23/24 1449 12/02/24 1433  HGBA1C 8.0* 7.6*    Objective:    Physical Exam  Gen: Well-appearing, in no acute distress; non-toxic CV: Well-perfused. Warm.  Resp: Breathing unlabored on room air; no wheezing. Psych: Fluid speech in conversation; appropriate affect; normal thought process  Ortho Exam - Right hip: There is significant limitation in both internal and external range of motion with logroll compared to rather fluid motion of the left hip.  There is a positive FADIR and Stinchfield test.  Patient does walk with an antalgic gait and diminished hip swing.  Imaging:  *I did review the hip MRI during the visit today.  Narrative & Impression  MR HIP WITHOUT IV CONTRAST RIGHT   COMPARISON: None.   CLINICAL HISTORY: Chronic right hip pain.   PULSE SEQUENCES: AX T1, Ax T2 FS, Cor T1, COR STIR & SMALL FOV COR PD FS without contrast.   FINDINGS:   Bones and labrum: Mild-to-moderate degenerative arthrosis of the right hip with joint space loss, mild subchondral cystic changes in the lateral acetabulum and small reactive joint effusion. There is slight remodeling of the femoral head. No fracture or contusion pattern. Degenerative changes are seen in the labrum without displaced labral tear. Sacrum and pelvis are unremarkable.    Musculotendinous structures: No significant tendinosis or myositis. No significant bursal collection.   There is a heterogeneously enlarged prostate gland. Likely reflective of benign prostatic hypertrophy. Clinical correlation. Otherwise, the visualized intrapelvic structures demonstrate no significant abnormality. There are a few scattered diverticula. Small umbilical hernia containing fat.   IMPRESSION: Mild degenerative changes in the right hip with osteophyte formation mild joint space loss. Small reactive joint effusion mild subchondral cystic changes. Mild degenerative changes in the labrum.   No acute right hip abnormality.   No significant musculotendinous abnormality or bursal collection.   Electronically signed by: Norleen Satchel MD 05/06/2024 01:04 PM EDT RP Workstation: [id number]    Past Medical/Family/Surgical/Social History: Medications & Allergies reviewed per EMR, new medications updated. 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 Patient had BMS x2 placed in RCA. Unstable Angina. PROMUS DES to the prox RCA   Diabetes mellitus without complication (HCC)    Erectile dysfunction    GERD (gastroesophageal reflux disease)    Hyperlipidemia    Hypertension    Nephrolithiasis    Dr. Ike   Renal cyst    Right shoulder pain    rotator cuff   Skin cancer    Skin cancer, PMH of, cell type unknown, Dr. Shona   Family History  Problem Relation Age of Onset   Breast cancer Mother    Cervical cancer Mother    Diabetes Father    Heart attack Father 42   Breast cancer Sister    Colon cancer Sister        mets to liver & lung   Stroke Neg Hx    Past Surgical History:  Procedure Laterality Date   CATARACT EXTRACTION W/ INTRAOCULAR LENS IMPLANT Bilateral    colonoscopy with ploypectomy  03/05/2012    Dr Jakie, GI   CORONARY ANGIOPLASTY WITH STENT PLACEMENT     X 3 total stents   CORONARY STENT INTERVENTION N/A 10/16/2024   Procedure: CORONARY STENT INTERVENTION;  Surgeon: Jordan, Peter M, MD;  Location: [facility] INVASIVE CV [facility];  Service: Cardiovascular;  Laterality: N/A;   cystoscopic removal  04/04/2010   CYSTOSCOPY N/A 12/15/2023   Procedure: CYSTOSCOPY FLEXIBLE; URETHERAL DILATION; INSERTION OF FOLEY CATHETER;  Surgeon: Cam Morene ORN, MD;  Location: [facility];  Service: Urology;  Laterality: N/A;   Flexibe sigmoidoscopy   12/06/1999   LEFT HEART CATH AND CORONARY ANGIOGRAPHY N/A 10/16/2024   Procedure: LEFT HEART CATH AND CORONARY ANGIOGRAPHY;  Surgeon: Jordan, Peter M, MD;  [facility] IN[facility] CV [facility];  Service: Cardiovascular;  Laterality: N/A;   LITHOTRIPSY     x 1   RECONSTRUCTION OF EYELID Left    TOTAL KNEE ARTHROPLASTY Left 12/15/2023   Procedure: LEFT TOTAL KNEE ARTHROPLASTY;  Surgeon: Vernetta Lonni GRADE, MD;  Location: [facility];  Service: Orthopedics;   Laterality: Left;   Social History   Occupational History   Occupation: Sports coach    Comment: Retired  Tobacco Use   Smoking status: Former    Current packs/day: 0.00    Types: Cigarettes    Quit date: 12/05/1970    Years since quitting: 54.0    Passive exposure: Never   Smokeless tobacco: Never   Tobacco comments:    smoked 1961-1972, up to 1 ppd  Vaping Use   Vaping status: Never Used  Substance and Sexual Activity   Alcohol use: Not Currently    Comment: minimally, 6 beers/year   Drug use: No   Sexual activity: Not on file   "

## 2024-12-30 ENCOUNTER — Other Ambulatory Visit (HOSPITAL_COMMUNITY): Payer: Self-pay

## 2025-01-15 ENCOUNTER — Encounter: Admitting: Orthopaedic Surgery

## 2025-01-23 ENCOUNTER — Encounter: Admitting: Orthopaedic Surgery

## 2025-03-03 ENCOUNTER — Ambulatory Visit: Admitting: Nurse Practitioner
